# Patient Record
Sex: Female | Born: 1990 | Race: Black or African American | Hispanic: No | Marital: Married | Smoking: Never smoker
Health system: Southern US, Community
[De-identification: ages and names within clinical notes are randomized; demographics above are authoritative.]

## PROBLEM LIST (undated history)

## (undated) ENCOUNTER — Inpatient Hospital Stay (HOSPITAL_COMMUNITY): Payer: Self-pay

## (undated) DIAGNOSIS — A6009 Herpesviral infection of other urogenital tract: Secondary | ICD-10-CM

## (undated) DIAGNOSIS — F909 Attention-deficit hyperactivity disorder, unspecified type: Secondary | ICD-10-CM

## (undated) DIAGNOSIS — J45909 Unspecified asthma, uncomplicated: Secondary | ICD-10-CM

## (undated) DIAGNOSIS — N390 Urinary tract infection, site not specified: Secondary | ICD-10-CM

## (undated) DIAGNOSIS — K529 Noninfective gastroenteritis and colitis, unspecified: Secondary | ICD-10-CM

## (undated) DIAGNOSIS — O009 Unspecified ectopic pregnancy without intrauterine pregnancy: Secondary | ICD-10-CM

## (undated) HISTORY — DX: Unspecified asthma, uncomplicated: J45.909

## (undated) HISTORY — DX: Noninfective gastroenteritis and colitis, unspecified: K52.9

## (undated) HISTORY — PX: NO PAST SURGERIES: SHX2092

---
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
## 2010-03-10 ENCOUNTER — Encounter (INDEPENDENT_AMBULATORY_CARE_PROVIDER_SITE_OTHER): Payer: Self-pay | Admitting: *Deleted

## 2010-03-10 ENCOUNTER — Emergency Department (HOSPITAL_COMMUNITY)
Admission: EM | Admit: 2010-03-10 | Discharge: 2010-03-11 | Payer: Self-pay | Source: Home / Self Care | Admitting: Emergency Medicine

## 2010-03-11 ENCOUNTER — Encounter (INDEPENDENT_AMBULATORY_CARE_PROVIDER_SITE_OTHER): Payer: Self-pay | Admitting: *Deleted

## 2010-03-13 ENCOUNTER — Ambulatory Visit
Admission: RE | Admit: 2010-03-13 | Discharge: 2010-03-13 | Payer: Self-pay | Source: Home / Self Care | Attending: Family | Admitting: Family

## 2010-03-13 ENCOUNTER — Encounter: Payer: Self-pay | Admitting: Family

## 2010-03-13 DIAGNOSIS — K5289 Other specified noninfective gastroenteritis and colitis: Secondary | ICD-10-CM

## 2010-03-13 LAB — CONVERTED CEMR LAB
Beta hcg, urine, semiquantitative: NEGATIVE
Eosinophils Absolute: 0.2 10*3/uL (ref 0.0–0.7)
Eosinophils Relative: 2 % (ref 0–5)
HCT: 37.5 % (ref 36.0–46.0)
Hemoglobin: 12.8 g/dL (ref 12.0–15.0)
MCHC: 34.1 g/dL (ref 30.0–36.0)
MCV: 81.3 fL (ref 78.0–100.0)
Neutro Abs: 5.5 10*3/uL (ref 1.7–7.7)
Platelets: 250 10*3/uL (ref 150–400)

## 2010-03-14 ENCOUNTER — Telehealth: Payer: Self-pay | Admitting: Family

## 2010-04-05 ENCOUNTER — Telehealth: Payer: Self-pay | Admitting: Family

## 2010-04-06 ENCOUNTER — Telehealth: Payer: Self-pay | Admitting: Gastroenterology

## 2010-04-12 ENCOUNTER — Ambulatory Visit: Admit: 2010-04-12 | Payer: Self-pay | Admitting: Gastroenterology

## 2010-04-17 NOTE — Progress Notes (Signed)
Summary: Depo Provera schedule  Phone Note Call from Patient Call back at Home Phone [phone]   Caller: Patient Reason for Call: Talk to Nurse Summary of Call: Pls ck to see if she can have her Depro  shot at next OV 10/23/09 & advise Initial call taken by: Lannette Donath,  October 16, 2009 1:03 PM  Follow-up for Phone Call        Dosing schedule says she is due for next injection  8/17 - 8/31. Will 8/8 be too early?  Pt starts back to school on 10/26/09.  Nicki Guadalajara Fergerson CMA Duncan Dull)  October 16, 2009 3:26 PM   Additional Follow-up for Phone Call Additional follow up Details #1::        I would prefer that we not give it early.  Can she get it at school, or can she come back from school to get it? Alternatively we could try a different birth control method if she wants- such as the patch.  Thanks Additional Follow-up by: Lemont Fillers FNP,  October 17, 2009 9:21 AM    Additional Follow-up for Phone Call Additional follow up Details #2::    Left message on machine to return my call.  Nicki Guadalajara Fergerson CMA Duncan Dull)  October 17, 2009 9:27 AM   Spoke to pt, advised her per [facility] recommendation.  Pt states she wishes to continue with the injection and will be able to get injection at school.  Nicki Guadalajara Fergerson CMA Duncan Dull)  October 18, 2009 8:59 AM

## 2010-04-17 NOTE — Assessment & Plan Note (Signed)
Summary: 3 mth fu/kdc--room 5   Vital Signs:  Patient profile:   20 year old female Menstrual status:  regular Height:      63.75 inches Weight:      120.75 pounds BMI:     20.97 Temp:     98.2 degrees F oral Pulse rate:   60 / minute Pulse rhythm:   regular Resp:     16 per minute BP sitting:   102 / 68  (right arm) Cuff size:   regular  Vitals Entered By: Mervin Kung CMA (August 16, 2009 2:40 PM) CC: room 5  3 month follow up. Pt would like to discuss alternative to Strattera. States she gets hot flashes and feels drowsy while on it. Is Patient Diabetic? No   CC:  room 5  3 month follow up. Pt would like to discuss alternative to Strattera. States she gets hot flashes and feels drowsy while on it.Marland Kitchen  History of Present Illness: Ms Latanya Maudlin is an 20 year old female who presents today for follow up of her ADHD and to receive her Depo injection.  1)ADHD-  Patient reports that she did well in school last semester and earned a 3.4 GPA.  She feels that the strattera helps her to focus.  Last visit Strattera was decreased from 80mg  to 40mg .  She notes less somnolence with the lower dose- but still noted some.  Reports that she stopped Stattera approximately 1 month ago when she completed school.    Allergies: 1)  ! Amoxicillin  Physical Exam  General:  Well-developed,well-nourished,in no acute distress; alert,appropriate and cooperative throughout examination Lungs:  Normal respiratory effort, chest expands symmetrically. Lungs are clear to auscultation, no crackles or wheezes. Heart:  Normal rate and regular rhythm. S1 and S2 normal without gallop, murmur, click, rub or other extra sounds.   Impression & Recommendations:  Problem # 1:  ADHD (ICD-314.01) Assessment Comment Only Weight is stable, patient is currently off of Strattera and plans to stay off for the summer.  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  Plan to resume in the fall.  Problem # 2:  CONTRACEPTIVE  MANAGEMENT (ICD-V25.09) Assessment: Comment Only  Patient reports that she tolerated depo last visit- dose given today in office.  Orders: Depo-Provera 150mg  (Z6109) Admin of Therapeutic Inj  intramuscular or subcutaneous ([address]) Urine Pregnancy Test  ([address])  Patient Instructions: 1)  Please follow up in 3 months. 2)  Have a nice summer.  Current Allergies (reviewed today): ! AMOXICILLIN   Medication Administration  Injection # 1:    Medication: Depo-Provera 150mg     Diagnosis: CONTRACEPTIVE MANAGEMENT (ICD-V25.09)    Route: IM    Site: LUOQ gluteus    Exp Date: 05/16/2010    Lot #: [id number]    Mfr: Teva Parenteral Medicines, In.s    Patient tolerated injection without complications    Given by: Mervin Kung CMA (August 16, 2009 3:28 PM)  Orders Added: 1)  Depo-Provera 150mg  [J1055] 2)  Admin of Therapeutic Inj  intramuscular or subcutaneous [[address]] 3)  Urine Pregnancy Test  [81025] 4)  Est. Patient Level III [62130]   Laboratory Results   Urine Tests      Urine HCG: negative

## 2010-04-17 NOTE — Assessment & Plan Note (Signed)
Summary: REFILL ON MED   Vital Signs:  Patient profile:   20 year old female Menstrual status:  regular Weight:      120.13 pounds BMI:     20.86 Temp:     97.8 degrees F oral Pulse rate:   64 / minute Pulse rhythm:   regular Resp:     16 per minute BP sitting:   120 / 78  (left arm) Cuff size:   regular  Vitals Entered By: Mervin Kung CMA (May 24, 2009 10:52 AM) CC: room 16  follow up for ADHD management Comments Pt would like to get Depo Provera injection today.   CC:  room 16  follow up for ADHD management.  History of Present Illness: Holly Thornton is an 20 year old female who presents for follow up of her ADHD.  Notes tat she has been intolerant of higher dose of strattera (80) due to sedation,  she did well on 40mg  and was able to get through her classes. Notes that she has had some weight loss since last visit.  Patient had pap 2/28- this was done by OB/GYN.   She also came requesting depo injection.     Allergies: 1)  ! Amoxicillin  Physical Exam  General:  Well-developed,well-nourished,in no acute distress; alert,appropriate and cooperative throughout examination Lungs:  Normal respiratory effort, chest expands symmetrically. Lungs are clear to auscultation, no crackles or wheezes. Heart:  Normal rate and regular rhythm. S1 and S2 normal without gallop, murmur, click, rub or other extra sounds. Psych:  Cognition and judgment appear intact. Alert and cooperative with normal attention span and concentration. No apparent delusions, illusions, hallucinations   Impression & Recommendations:  Problem # 1:  ADHD (ICD-314.01) Assessment Improved Will plan to continue Strattera at decreased dose.  I advised pt to monitor weight diet recs- see pt instructions.    Problem # 2:  CONTRACEPTIVE MANAGEMENT (ICD-V25.09) Assessment: Comment Only Patient was given her previous dose of depo by her GYN.  She had an episode of local itching following this injection.   Unfortunately, CMA administered dose prior to my visit with the patient.  Hopefully she will not have a local reaction this time- I advised patient that if she has local reaction again, that she should consider alternate birth control such as the patch or nuvaring.  She had trouble remembering to take the pill.  I advised her to follow up with GYN for further contraceptive discussion moving forward,and to notify us if she develops irritation following this dose of depo.   Orders: Depo-Provera 150mg  (J1055) Admin of Therapeutic Inj  intramuscular or subcutaneous ([address])  Complete Medication List: 1)  Strattera 40 Mg Caps (Atomoxetine hcl) .... One tab by mouth daily  Other Orders: Urine Pregnancy Test  (98119)  Patient Instructions: 1)  Please follow up in 2 months.   2)  Eat regular, healthy, well balanced meals.   3)  Call if you continue to have weight loss- weigh yourself once a week. Prescriptions: STRATTERA 40 MG CAPS (ATOMOXETINE HCL) one tab by mouth daily  #30 x 0   Entered and Authorized by:   Lemont Fillers FNP   Signed by:   Lemont Fillers FNP on 05/24/2009   Method used:   Print then Give to Patient   RxID:   [id number]   Current Allergies (reviewed today): ! AMOXICILLIN  Laboratory Results   Urine Tests      Urine HCG: negative  Medication Administration  Injection # 1:    Medication: Depo-Provera 150mg     Diagnosis: CONTRACEPTIVE MANAGEMENT (ICD-V25.09)    Route: IM    Site: RUOQ gluteus    Exp Date: 05/17/2011    Lot #: [id number]    Mfr: [employer]td  Orders Added: 1)  Urine Pregnancy Test  [81025] 2)  Depo-Provera 150mg  [J1055] 3)  Admin of Therapeutic Inj  intramuscular or subcutaneous [96372] 4)  Est. Patient Level III [[address]]

## 2010-04-17 NOTE — Assessment & Plan Note (Signed)
Summary: 3 month fu/dt resch per pt/dt--Rm 5   Vital Signs:  Patient profile:   20 year old female Menstrual status:  regular Height:      63.75 inches Weight:      123.25 pounds BMI:     21.40 Temp:     98.6 degrees F oral Pulse rate:   78 / minute Pulse rhythm:   regular Resp:     16 per minute BP sitting:   94 / 70  (right arm) Cuff size:   regular  Vitals Entered By: Mervin Kung CMA Duncan Dull) (October 23, 2009 11:04 AM) CC: room 5  3 month follow up. Needs rx for Stratter 40mg  before starting school. Is Patient Diabetic? No   CC:  room 5  3 month follow up. Needs rx for Stratter 40mg  before starting school.Marland Kitchen  History of Present Illness: Ms Holly Thornton is a 20 year old female who presents today for follow up of her ADHD.  She reports that she is returning to school in a few days and wishes to resume Strattera.  Previously she has tolerated the 40mg  dose.  She noted that the higher dose (80 mg) made her too sleepy.   She also wishes to inquire about alternate birth control methods.  She has trouble remembering to take the pill.  She did have one episode of bleeding recently on depo (like a "period").         Allergies: 1)  ! Amoxicillin  Physical Exam  General:  Well-developed,well-nourished,in no acute distress; alert,appropriate and cooperative throughout examination Lungs:  Normal respiratory effort, chest expands symmetrically. Lungs are clear to auscultation, no crackles or wheezes. Heart:  Normal rate and regular rhythm. S1 and S2 normal without gallop, murmur, click, rub or other extra sounds.   Impression & Recommendations:  Problem # 1:  ADHD (ICD-314.01) Assessment Unchanged Will resume Strattera at 40 mg.  Patient was instructed to follow up over the Christmas Holiday when she is back from school.  Sooner if problems or concerns.  Problem # 2:  CONTRACEPTIVE MANAGEMENT (ICD-V25.09) Assessment: Comment Only  Pt wishes to change her birth control method.   Will try Ortho Evra.  Patient was instructed on it's proper use.    Orders: Urine Pregnancy Test  (16109)  Complete Medication List: 1)  Strattera 40 Mg Caps (Atomoxetine hcl) .... One tablet by mouth daily 2)  Ortho Evra 150-20 Mcg/24hr Ptwk (Norelgestromin-eth estradiol) .... Apply one patch weekly x 3 weeks, leave patch off on 4th week.  Patient Instructions: 1)  Please follow up over Christmas break, sooner if symptoms or concerns. 2)  Please start the Ortho Evra patch on 8/21.  Stop the Depo. Prescriptions: ORTHO EVRA 150-20 MCG/24HR PTWK (NORELGESTROMIN-ETH ESTRADIOL) apply one patch weekly x 3 weeks, leave patch off on 4th week.  #3 x 5   Entered and Authorized by:   Lemont Fillers FNP   Signed by:   Lemont Fillers FNP on 10/23/2009   Method used:   [facility]       [address]       [place], [place]  [address]       Ph: [phone]       Fax: [phone]   RxID:   [id number] STRATTERA 40 MG CAPS (ATOMOXETINE HCL) one tablet by mouth daily  #30 x 2   Entered  and Authorized by:   Lemont Fillers FNP   Signed by:   Lemont Fillers FNP on 10/23/2009   Method used:   [facility]       [facility]       [address]       [place], [place]  [address]       Ph: [phone]       Fax: [phone]   RxID:   [id number]   Current Allergies (reviewed today): ! AMOXICILLIN  Laboratory Results   Urine Tests      Urine HCG: negative

## 2010-04-17 NOTE — Progress Notes (Signed)
Summary: stratter refill   Phone Note Refill Request Message from:  Patient on April 14, 2009 8:26 AM  Refills Requested: Medication #1:  STRATTERA 80 MG CAPS one half cap by mouth daily x 3 days then increase to one cap by mouth daily Initial call taken by: Doristine Devoid,  April 14, 2009 8:26 AM  Follow-up for Phone Call        spoke w/ patient aware prescription ready for pick up.........Marland KitchenDoristine Devoid  April 14, 2009 8:30 AM     New/Updated Medications: STRATTERA 80 MG CAPS (ATOMOXETINE HCL) take one tablet daily Prescriptions: STRATTERA 80 MG CAPS (ATOMOXETINE HCL) take one tablet daily  #30 x 0   Entered by:   Doristine Devoid   Authorized by:   Lemont Fillers FNP   Signed by:   Doristine Devoid on 04/14/2009   Method used:   Print then Give to Patient   RxID:   [id number]

## 2010-04-17 NOTE — Progress Notes (Signed)
Summary: questions about rx-- lm  Phone Note Call from Patient Call back at [phone]   Caller: Mom Summary of Call: mom left msg on voicemail had questions about prescription ...Marland KitchenMarland KitchenDoristine Thornton  May 10, 2009 3:28 PM   Follow-up for Phone Call        left message on machine ...Marland KitchenMarland KitchenDoristine Thornton  May 10, 2009 3:28 PM   left message on machine ........Marland KitchenDoristine Thornton  May 17, 2009 3:50 PM   Additional Follow-up for Phone Call Additional follow up Details #1::        pt states that STRATTERA 80 MG is to strong so she would prefer to go back to STRATTERA 40 MG. PLS Advise...............Marland KitchenFelecia Deloach CMA  May 17, 2009 4:31 PM     Additional Follow-up for Phone Call Additional follow up Details #2::    Called patient- tellsme that she stopped strattera 1 week ago due to  hot flashes and drowsiness.  She goes to school in [place], [place]- and will be home on break next week.  I advised patient to make apt to return for follow up next week and we can address then. Follow-up by: Lemont Fillers FNP,  May 17, 2009 5:39 PM  Additional Follow-up for Phone Call Additional follow up Details #3:: Details for Additional Follow-up Action Taken: Pls call pt and arrange follow up next week. thanks  05/18/09 Left message @ 8:55a.m. for pt. to call and schedule appt. for next week. tf,cma  05/19/09 Spoke with pt. and scheduled her for f/u with Melissa for 05/23/09 @ 10:30. tf,cma Additional Follow-up by: Lemont Fillers FNP,  May 17, 2009 5:39 PM

## 2010-04-19 NOTE — Progress Notes (Signed)
Summary: lab result  Phone Note Outgoing Call   Summary of Call: Please call patient to see how she is feeling and let her know that her blood work was normal.  Initial call taken by: Lemont Fillers FNP,  March 14, 2010 8:00 AM  Follow-up for Phone Call        Pt notified. Nicki Guadalajara Fergerson CMA Duncan Dull)  March 14, 2010 8:40 AM

## 2010-04-19 NOTE — Progress Notes (Signed)
Summary: appt sooner tahn first avail -will be new pt  Phone Note From Other Clinic   Caller: [facility] [phone] Call For: Dr Arlyce Dice ( Doc of the Day ) Reason for Call: Schedule Patient Appt Summary of Call: Requesting appt sooner than first avail for Colitis. Requesting withing a couple weeks PA appt ok Initial call taken by: Leanor Kail [facility],  April 06, 2010 9:10 AM  Follow-up for Phone Call        Appointment made with Dr. Arlyce Dice for 04/12/10 @10 :45am. Follow-up by: Selinda Michaels RN,  April 06, 2010 9:43 AM

## 2010-04-19 NOTE — Progress Notes (Signed)
Summary: Pt having sx of Colitis  Phone Note Call from Patient   Caller: Mom Details for Reason: Colitis Summary of Call: Pt's mother came in pt having symptons of her colitis     she is in school in [place], [place]  and needs her meds   please call mother  Holly Thornton   [phone] Initial call taken by: Darral Dash,  April 05, 2010 4:55 PM  Follow-up for Phone Call        Left message on number listed for Holly Thornton for her to return my call. Follow-up by: Lemont Fillers FNP,  April 06, 2010 8:14 AM  Additional Follow-up for Phone Call Additional follow up Details #1::        Notes that her stomach was hurting yesterday.   Symptoms consistent with the pain that she felt when she had colitis back in December.  Took motrin with some relief.  She is at school in [facility].  Recommended that she be evaluated locally either at an urgent care or student health services.  If she is unable to do this she can schedule to be seen with me in [place].  Will also plan referral to GI for formal evaluation due to recurrence of symptoms.   Additional Follow-up by: Lemont Fillers FNP,  April 06, 2010 8:46 AM

## 2010-04-19 NOTE — Assessment & Plan Note (Signed)
Summary: colitis/mhf--Rm 4   Vital Signs:  Patient profile:   20 year old female Menstrual status:  regular Height:      63.75 inches Weight:      115.50 pounds BMI:     20.05 Temp:     97.5 degrees F oral Pulse rate:   90 / minute Pulse rhythm:   regular Resp:     18 per minute BP sitting:   98 / 68  (right arm) Cuff size:   regular  Vitals Entered By: Mervin Kung CMA Duncan Dull) (March 13, 2010 2:30 PM) CC: Pt states she was diagnosed with Colitis by the ER on 03/10/10. Still has abdominal pain, diarrhea, vomiting, intermittent fever and heavy menstrual bleeding x 6 days. Is Patient Diabetic? No Pain Assessment Patient in pain? yes     Location: abdomen Comments Pt states she stopped Strattera because of the way it makes her feel. Has also stopped birth control. Nicki Guadalajara Fergerson CMA Duncan Dull)  March 13, 2010 2:38 PM    Primary Care Christofer Shen:  Lemont Fillers FNP  CC:  Pt states she was diagnosed with Colitis by the ER on 03/10/10. Still has abdominal pain, diarrhea, vomiting, and intermittent fever and heavy menstrual bleeding x 6 days.Marland Kitchen  History of Present Illness: Ms.  Latanya Maudlin is a 20 year old female who presents today for ER f/u.  Started to have abdominal pain on 12/23 and went to ED.  Noted to have "colitis" with a mild leukocytosis.  She was given rx for cipro 500mg  by mouth two times a day x 5 days as well as vicodin for pain.  Since notes that her abdominal pain is located n the top of her stomach.  Pain is improved since 12/25 but not resolved.  Ate for the first time yesterday in 4 days.  + diarrhea- 4x in last 24 hours.  Has period. Started last wedesday. She continues to have heavy bleeding.  She is not on any birth control at present.  Took off in November.  She discontinued due to local skin reaction.    Strattera- makes her feel uneasy, droopy, nauseated.  Has been doing well at school off of strattera.    Allergies: 1)  ! Amoxicillin  Past  History:  Past Medical History: Last updated: 03/01/2009 HIV testing  Family History: Reviewed history from 03/01/2009 and no changes required. Mom- depression, fibroids, low iron Dad- healthy only child  Social History: Reviewed history from 03/01/2009 and no changes required. Single Never Smoked Alcohol use-no Regular exercise-yes Student  Review of Systems       see HPI  Physical Exam  General:  Well-developed,well-nourished,in no acute distress; alert,appropriate and cooperative throughout examination Lungs:  Normal respiratory effort, chest expands symmetrically. Lungs are clear to auscultation, no crackles or wheezes. Heart:  Normal rate and regular rhythm. S1 and S2 normal without gallop, murmur, click, rub or other extra sounds. Abdomen:  Mild tenderness without guarding.  soft and no distention.  + BS   Impression & Recommendations:  Problem # 1:  COLITIS, ACUTE (ICD-558.9) Assessment New Will plan to continue cipro x 10 days total and add metronidazole to her regimen.  Check CBC to evaluate WBC trend.   Orders: TLB-CBC Platelet - w/Differential (85025-CBCD)  Problem # 2:  ADHD (ICD-314.01) Assessment: Improved Stable, despite being off of strattera.  Continue to monitor.  Problem # 3:  CONTRACEPTIVE MANAGEMENT (ICD-V25.09) Long discussion with patient and mother regarding importance of birth control.  She will consider  OCP initiation this summer when she is home. For now she wishes to resume depo.  We discussed that this should not be a long term birth control method.  Orders: Depo-Provera 150mg  (J1055) Admin of Therapeutic Inj  intramuscular or subcutaneous ([address]) Urine Pregnancy Test  ([address])  Complete Medication List: 1)  Cipro 500 Mg Tabs (Ciprofloxacin hcl) .... One tablet by mouth two times a day until finished 2)  Metronidazole 500 Mg Tabs (Metronidazole) .... One tablet by mouth three times a day x 10 days 3)  Depo-provera 150 Mg/ml Susp  (Medroxyprogesterone acetate) .... 150mg  im every 3 months.  Patient Instructions: 1)  Please complete your lab work downstairs today.   2)  Go to the ER if you develop worsening abdominal pain, or fever over 101.   3)  Follow up in 1 week. Prescriptions: METRONIDAZOLE 500 MG TABS (METRONIDAZOLE) one tablet by mouth three times a day x 10 days  #30 x 0   Entered and Authorized by:   Lemont Fillers FNP   Signed by:   Lemont Fillers FNP on 03/13/2010   Method used:   [facility]       [facility]       [address]       [place], [place]  [address]       Ph: [phone]       Fax: [phone]   RxID:   [id number] CIPRO 500 MG TABS (CIPROFLOXACIN HCL) one tablet by mouth two times a day until finished  #10 x 0   Entered and Authorized by:   Lemont Fillers FNP   Signed by:   Lemont Fillers FNP on 03/13/2010   Method used:   [facility]       [facility]       [address]       [place], [place]  [address]       Ph: [phone]       Fax: [phone]   RxID:   [id number]    Medication Administration  Injection # 3:    Medication: Depo-Provera 150mg     Diagnosis: CONTRACEPTIVE MANAGEMENT (ICD-V25.09)    Route: IM    Site: L deltoid    Exp Date: 06/15/2012    Lot #: [id number]    Mfr: Francisca December    Patient tolerated injection without complications    Given by: Glendell Docker CMA (March 13, 2010 3:18 PM)  Orders Added: 1)  TLB-CBC Platelet - w/Differential [85025-CBCD] 2)  Depo-Provera 150mg  [J1055] 3)  Admin of Therapeutic Inj  intramuscular or subcutaneous [96372] 4)  Urine Pregnancy Test  [81025] 5)  Est. Patient Level III [[address]]    Current Allergies (reviewed today): ! AMOXICILLIN   Medication Administration  Injection # 3:    Medication: Depo-Provera 150mg     Diagnosis: CONTRACEPTIVE MANAGEMENT  (ICD-V25.09)    Route: IM    Site: L deltoid    Exp Date: 06/15/2012    Lot #: [id number]    Mfr: Francisca December    Patient tolerated injection without complications    Given by: Glendell Docker CMA (March 13, 2010 3:18 PM)  Orders Added: 1)  TLB-CBC Platelet - w/Differential [85025-CBCD] 2)  Depo-Provera 150mg  [J1055] 3)  Admin of Therapeutic Inj  intramuscular or subcutaneous [96372] 4)  Urine Pregnancy Test  [81025] 5)  Est. Patient Level III [99213]   Laboratory Results   Urine Tests      Urine HCG: negative

## 2010-05-22 ENCOUNTER — Emergency Department (HOSPITAL_BASED_OUTPATIENT_CLINIC_OR_DEPARTMENT_OTHER)
Admission: EM | Admit: 2010-05-22 | Discharge: 2010-05-22 | Disposition: A | Discharge status: Home / Self Care | Payer: Self-pay | Attending: Emergency Medicine | Admitting: Emergency Medicine

## 2010-05-22 DIAGNOSIS — B353 Tinea pedis: Secondary | ICD-10-CM | POA: Insufficient documentation

## 2010-05-22 DIAGNOSIS — R21 Rash and other nonspecific skin eruption: Secondary | ICD-10-CM | POA: Insufficient documentation

## 2010-05-22 DIAGNOSIS — J45909 Unspecified asthma, uncomplicated: Secondary | ICD-10-CM | POA: Insufficient documentation

## 2010-05-28 LAB — URINALYSIS, ROUTINE W REFLEX MICROSCOPIC
Ketones, ur: 15 mg/dL — AB
Protein, ur: NEGATIVE mg/dL
Specific Gravity, Urine: 1.038 — ABNORMAL HIGH (ref 1.005–1.030)
Urobilinogen, UA: 1 mg/dL (ref 0.0–1.0)
pH: 6 (ref 5.0–8.0)

## 2010-05-28 LAB — COMPREHENSIVE METABOLIC PANEL
AST: 21 U/L (ref 0–37)
Alkaline Phosphatase: 41 U/L (ref 39–117)
CO2: 24 mEq/L (ref 19–32)
Glucose, Bld: 81 mg/dL (ref 70–99)
Potassium: 3.9 mEq/L (ref 3.5–5.1)
Sodium: 139 mEq/L (ref 135–145)
Total Bilirubin: 0.5 mg/dL (ref 0.3–1.2)

## 2010-05-28 LAB — CBC
Hemoglobin: 13.1 g/dL (ref 12.0–15.0)
MCH: 28.4 pg (ref 26.0–34.0)
MCHC: 33.8 g/dL (ref 30.0–36.0)
MCV: 84.2 fL (ref 78.0–100.0)
Platelets: 229 10*3/uL (ref 150–400)
RBC: 4.61 MIL/uL (ref 3.87–5.11)

## 2010-05-28 LAB — DIFFERENTIAL
Basophils Relative: 0 % (ref 0–1)
Lymphocytes Relative: 8 % — ABNORMAL LOW (ref 12–46)
Lymphs Abs: 1.2 10*3/uL (ref 0.7–4.0)
Monocytes Absolute: 0.4 10*3/uL (ref 0.1–1.0)
Neutro Abs: 13.1 10*3/uL — ABNORMAL HIGH (ref 1.7–7.7)
Neutrophils Relative %: 89 % — ABNORMAL HIGH (ref 43–77)

## 2010-10-19 ENCOUNTER — Encounter: Payer: Self-pay | Admitting: Family

## 2010-10-22 ENCOUNTER — Ambulatory Visit (INDEPENDENT_AMBULATORY_CARE_PROVIDER_SITE_OTHER): Payer: Managed Care, Other (non HMO) | Admitting: Family

## 2010-10-22 ENCOUNTER — Encounter: Payer: Self-pay | Admitting: Family

## 2010-10-22 VITALS — BP 90/60 | HR 66 | Temp 98.1°F | Resp 16 | Ht 63.74 in

## 2010-10-22 DIAGNOSIS — N926 Irregular menstruation, unspecified: Secondary | ICD-10-CM

## 2010-10-22 DIAGNOSIS — Z309 Encounter for contraceptive management, unspecified: Secondary | ICD-10-CM

## 2010-10-22 MED ORDER — LEVONORGEST-ETH ESTRAD 91-DAY 0.15-0.03 &0.01 MG PO TABS: 1.0000 | ORAL_TABLET | Freq: Every day | ORAL | Status: DC

## 2010-10-22 NOTE — Progress Notes (Signed)
  Subjective:    Patient ID: Holly Thornton, female    DOB: 1990/04/01, 20 y.o.   MRN: [id number]  HPI  Holly Thornton is a 20 yr old female who presents today with chief complaint of abnormal menses.  She received her last shot of depo on 3/23 while at school. She was due to return for Depo shot on 6/26.  She had a normal period the last week of June and then started to bleed again the second week of July.  Noted associated clotting and cramping.  She reports that she is not  not sexually active.  She as been taking depo for the last 2 years.      Review of Systems See HPI  No past medical history on file.  History   Social History  . Marital Status: Single    Spouse Name: N/A    Number of Children: N/A  . Years of Education: N/A   Occupational History  . Not on file.   Social History Main Topics  . Smoking status: Never Smoker   . Smokeless tobacco: Never Used  . Alcohol Use: No  . Drug Use: Not on file  . Sexually Active: Not on file   Other Topics Concern  . Not on file   Social History Narrative   Regular exercise:  Yes    No past surgical history on file.  Family History  Problem Relation Age of Onset  . Depression Mother   . Fibroids Mother   . Anemia Mother     Allergies  Allergen Reactions  . Amoxicillin     No current outpatient prescriptions on file prior to visit.    BP 90/60  Pulse 66  Temp(Src) 98.1 F (36.7 C) (Oral)  Resp 16  Ht 5' 3.74" (1.619 m)  LMP 10/22/2010       Objective:   Physical Exam  Constitutional: She appears well-developed and well-nourished.  Cardiovascular: Normal rate and regular rhythm.   Pulmonary/Chest: Effort normal and breath sounds normal.  Musculoskeletal: She exhibits no edema.  Psychiatric: She has a normal mood and affect. Her behavior is normal. Judgment and thought content normal.          Assessment & Plan:

## 2010-10-22 NOTE — Patient Instructions (Signed)
Please follow up in 6 months, sooner if problems or concerns.  

## 2010-10-22 NOTE — Assessment & Plan Note (Signed)
Likely due to coming off of Depo.  We discussed that Depo should not be used long term. She wishes to try a 3 month OCP.  This should help with her dysmenorrhea and irregular menses as well as provide her with an alternate form of birth control.  Urine HCG is negative today.  Will initiate OCP- pt to f/u in 6 mos.

## 2011-03-04 ENCOUNTER — Encounter: Payer: Self-pay | Admitting: Family

## 2011-03-04 ENCOUNTER — Ambulatory Visit (INDEPENDENT_AMBULATORY_CARE_PROVIDER_SITE_OTHER): Payer: Managed Care, Other (non HMO) | Admitting: Family

## 2011-03-04 DIAGNOSIS — N926 Irregular menstruation, unspecified: Secondary | ICD-10-CM

## 2011-03-04 DIAGNOSIS — F909 Attention-deficit hyperactivity disorder, unspecified type: Secondary | ICD-10-CM

## 2011-03-04 DIAGNOSIS — Z23 Encounter for immunization: Secondary | ICD-10-CM

## 2011-03-04 LAB — POCT URINE PREGNANCY: Preg Test, Ur: NEGATIVE

## 2011-03-04 NOTE — Assessment & Plan Note (Signed)
Will giver her trial of a 3 month continuous OCP.  If her bleeding does not improve, will plan to refer her to GYN for further evaluation.

## 2011-03-04 NOTE — Assessment & Plan Note (Signed)
Pt reports that this is currently well controlled off of medication. Will monitor.

## 2011-03-04 NOTE — Patient Instructions (Signed)
Please follow up in 4 months

## 2011-03-04 NOTE — Progress Notes (Signed)
  Subjective:    Patient ID: Holly Thornton, female    DOB: [birth year], 20 y.o.   MRN: [id number]  HPI  Holly Thornton is a 20 yr old female who presents today for follow up.  1) Contraception- reports that she has been bleeding for almost 2 weeks- notes that she has been using double pads 4 times a day.  Not sexually active, never started the OCP that was prescribed last visit.  Since going off of the Depo she has had her period monthly, but reports that it has been lasting up to 2 weeks.    2) ADHD- she has not been in school for the last semester. She feels that things are doing a lot better.   3) Wants a flu shot.     Review of Systems See HPI  No past medical history on file.  History   Social History  . Marital Status: Single    Spouse Name: N/A    Number of Children: N/A  . Years of Education: N/A   Occupational History  . Not on file.   Social History Main Topics  . Smoking status: Never Smoker   . Smokeless tobacco: Never Used  . Alcohol Use: No  . Drug Use: Not on file  . Sexually Active: Not on file   Other Topics Concern  . Not on file   Social History Narrative   Regular exercise:  Yes    No past surgical history on file.  Family History  Problem Relation Age of Onset  . Depression Mother   . Fibroids Mother   . Anemia Mother     Allergies  Allergen Reactions  . Amoxicillin     No current outpatient prescriptions on file prior to visit.    BP 90/66  Pulse 78  Temp(Src) 97.8 F (36.6 C) (Oral)  Resp 16  Wt 120 lb 1.3 oz (54.468 kg)  LMP 03/04/2011       Objective:   Physical Exam  Constitutional: She appears well-developed and well-nourished. No distress.  Cardiovascular: Normal rate and regular rhythm.   No murmur heard. Pulmonary/Chest: Effort normal and breath sounds normal. No respiratory distress. She has no wheezes. She has no rales. She exhibits no tenderness.  Skin: Skin is warm and dry.  Psychiatric: She has a normal  mood and affect. Her behavior is normal. Judgment and thought content normal.          Assessment & Plan:

## 2011-03-04 NOTE — Progress Notes (Signed)
Addended by: Mervin Kung A on: 03/04/2011 12:07 PM   Modules accepted: Orders

## 2011-04-24 ENCOUNTER — Ambulatory Visit: Payer: Managed Care, Other (non HMO) | Admitting: Family

## 2011-06-28 ENCOUNTER — Ambulatory Visit: Payer: Managed Care, Other (non HMO) | Admitting: Family

## 2011-10-15 ENCOUNTER — Encounter: Payer: Self-pay | Admitting: Family

## 2011-10-15 ENCOUNTER — Ambulatory Visit (INDEPENDENT_AMBULATORY_CARE_PROVIDER_SITE_OTHER): Payer: Managed Care, Other (non HMO) | Admitting: Family

## 2011-10-15 VITALS — BP 90/68 | HR 67 | Temp 98.0°F | Resp 14 | Ht 64.5 in | Wt 111.1 lb

## 2011-10-15 DIAGNOSIS — B360 Pityriasis versicolor: Secondary | ICD-10-CM

## 2011-10-15 DIAGNOSIS — N926 Irregular menstruation, unspecified: Secondary | ICD-10-CM

## 2011-10-15 DIAGNOSIS — Z0000 Encounter for general adult medical examination without abnormal findings: Secondary | ICD-10-CM | POA: Insufficient documentation

## 2011-10-15 LAB — LIPID PANEL
Cholesterol: 175 mg/dL (ref 0–200)
HDL: 52 mg/dL (ref >39)
LDL Cholesterol: 112 mg/dL — ABNORMAL HIGH (ref 0–99)
Triglycerides: 53 mg/dL (ref <150)

## 2011-10-15 LAB — CBC WITH DIFFERENTIAL/PLATELET
Basophils Relative: 1 % (ref 0–1)
Eosinophils Relative: 3 % (ref 0–5)
HCT: 39.4 % (ref 36.0–46.0)
Hemoglobin: 13.4 g/dL (ref 12.0–15.0)
Lymphocytes Relative: 45 % (ref 12–46)
MCH: 28.2 pg (ref 26.0–34.0)
MCHC: 34 g/dL (ref 30.0–36.0)
MCV: 82.8 fL (ref 78.0–100.0)
Neutro Abs: 2 10*3/uL (ref 1.7–7.7)
Neutrophils Relative %: 42 % — ABNORMAL LOW (ref 43–77)
RDW: 13.8 % (ref 11.5–15.5)

## 2011-10-15 LAB — BASIC METABOLIC PANEL WITH GFR
BUN: 15 mg/dL (ref 6–23)
Chloride: 103 mEq/L (ref 96–112)
GFR, Est Non African American: 83 mL/min
Glucose, Bld: 81 mg/dL (ref 70–99)

## 2011-10-15 LAB — TSH: TSH: 1.867 u[IU]/mL (ref 0.350–4.500)

## 2011-10-15 LAB — HEPATIC FUNCTION PANEL
AST: 21 U/L (ref 0–37)
Albumin: 4.9 g/dL (ref 3.5–5.2)
Alkaline Phosphatase: 36 U/L — ABNORMAL LOW (ref 39–117)
Bilirubin, Direct: 0.2 mg/dL (ref 0.0–0.3)
Indirect Bilirubin: 0.5 mg/dL (ref 0.0–0.9)
Total Bilirubin: 0.7 mg/dL (ref 0.3–1.2)
Total Protein: 7.5 g/dL (ref 6.0–8.3)

## 2011-10-15 MED ORDER — SELENIUM SULFIDE 2.5 % EX LOTN: TOPICAL_LOTION | CUTANEOUS | Status: DC

## 2011-10-15 NOTE — Patient Instructions (Addendum)
Please complete your blood work prior to leaving. Schedule pap smear when period is over.

## 2011-10-15 NOTE — Assessment & Plan Note (Signed)
Some breakthrough bleeding this month.  Encouraged pt to continue OCP. Call if bleeding does not stop in next few days or if she has persistent breakthrough bleeding on the next pack.

## 2011-10-15 NOTE — Assessment & Plan Note (Signed)
Trial of selsun lotion.  

## 2011-10-15 NOTE — Assessment & Plan Note (Addendum)
Immunizations reviewed and up to date. She is counseled on importance of eating 3 regular meals a day. Fasting labs today.

## 2011-10-15 NOTE — Progress Notes (Signed)
Subjective:    Patient ID: Holly Thornton, female    DOB: 1990/11/27, 21 y.o.   MRN: [id number]  HPI  Preventative- She has been walking a lot on campus.  Also was working out at [employer]  Has lost some weight while at school- due to increased activity, not eating reg meals. Tetanus is up to date.  Rash- reports that she developed itchy rash while at school and was told it was eczema.  She was treated with steroids. Itching has resolved, but discoloration persists.   Irregular menses- now on Camrese.  First 3 months- no spotting, regular period at the end of the pack.  Started 2nd pack 3 weeks ago.  Now bleeding x 3 days.  Reports no chance of pregnancy.  Not sexually active x 6 months.   Review of Systems  Constitutional: Positive for unexpected weight change.  HENT: Negative for hearing loss.   Eyes: Negative for visual disturbance.  Respiratory: Negative for cough.   Cardiovascular: Negative for chest pain.  Gastrointestinal: Negative for nausea, vomiting and diarrhea.  Genitourinary:       Reports that she is bleeding- in 3rd week of second pack. She has been bleeding since Saturday.    Musculoskeletal: Negative for myalgias and arthralgias.  Skin: Positive for rash.  Neurological: Negative for headaches.  Hematological: Negative for adenopathy.   No past medical history on file.  History   Social History  . Marital Status: Single    Spouse Name: N/A    Number of Children: N/A  . Years of Education: N/A   Occupational History  . Not on file.   Social History Main Topics  . Smoking status: Never Smoker   . Smokeless tobacco: Never Used  . Alcohol Use: 1.0 oz/week    2 drink(s) per week  . Drug Use: Not on file  . Sexually Active: Not on file   Other Topics Concern  . Not on file   Social History Narrative   Regular exercise:  Yes, walksCaffeine use:  2 glasses soda daily    No past surgical history on file.  Family History  Problem Relation Age of  Onset  . Depression Mother   . Fibroids Mother   . Anemia Mother     Allergies  Allergen Reactions  . Amoxicillin     Current Outpatient Prescriptions on File Prior to Visit  Medication Sig Dispense Refill  . Levonorgestrel-Ethinyl Estradiol (AMETHIA,CAMRESE) 0.15-0.03 &0.01 MG tablet Take 1 tablet by mouth daily.        . Multiple Vitamins-Minerals (MULTIVITAMIN WITH MINERALS) tablet Take 1 tablet by mouth daily.          BP 90/68  Pulse 67  Temp 98 F (36.7 C) (Oral)  Resp 14  Ht 5' 4.5" (1.638 m)  Wt 111 lb 1.3 oz (50.386 kg)  BMI 18.77 kg/m2  SpO2 98%  LMP 10/15/2011       Objective:   Physical Exam  Physical Exam  Constitutional: She is oriented to person, place, and time. She appears well-developed and well-nourished. No distress.  HENT:  Head: Normocephalic and atraumatic.  Right Ear: Tympanic membrane and ear canal normal.  Left Ear: Tympanic membrane and ear canal normal.  Mouth/Throat: Oropharynx is clear and moist.  Eyes: Pupils are equal, round, and reactive to light. No scleral icterus.  Neck: Normal range of motion. No thyromegaly present.  Cardiovascular: Normal rate and regular rhythm.   No murmur heard. Pulmonary/Chest: Effort normal and breath sounds  normal. No respiratory distress. He has no wheezes. She has no rales. She exhibits no tenderness.  Abdominal: Soft. Bowel sounds are normal. He exhibits no distension and no mass. There is no tenderness. There is no rebound and no guarding.  Musculoskeletal: She exhibits no edema.  Lymphadenopathy:    She has no cervical adenopathy.  Neurological: She is alert and oriented to person, place, and time. She has normal reflexes. She exhibits normal muscle tone. Coordination normal.  Skin: Skin is warm and dry. Hyperpigmented rash is noted on arms/thighs. Psychiatric: She has a normal mood and affect. Her behavior is normal. Judgment and thought content normal.  Breasts: Examined lying Right: Without  masses, retractions, discharge or axillary adenopathy.  Left: Without masses, retractions, discharge or axillary adenopathy.  Inguinal/mons: Normal without inguinal adenopathy  External genitalia: Normal  BUS/Urethra/Skene's glands: Normal  Bladder: Normal  Vagina: Normal- fair amount of menstrual blood is noted.  Pap deferred.           Assessment & Plan:         Assessment & Plan:

## 2011-10-16 ENCOUNTER — Encounter: Payer: Self-pay | Admitting: Family

## 2011-10-16 LAB — URINALYSIS, ROUTINE W REFLEX MICROSCOPIC
Glucose, UA: NEGATIVE mg/dL
Ketones, ur: 15 mg/dL — AB
Leukocytes, UA: NEGATIVE
Nitrite: NEGATIVE
Specific Gravity, Urine: 1.027 (ref 1.005–1.030)

## 2011-10-16 LAB — URINALYSIS, MICROSCOPIC ONLY: Crystals: NONE SEEN

## 2012-03-10 ENCOUNTER — Encounter: Payer: Self-pay | Admitting: Family

## 2012-03-10 ENCOUNTER — Other Ambulatory Visit (HOSPITAL_COMMUNITY)
Admission: RE | Admit: 2012-03-10 | Discharge: 2012-03-10 | Disposition: A | Discharge status: Home / Self Care | Payer: Managed Care, Other (non HMO) | Source: Ambulatory Visit | Attending: Family | Admitting: Family

## 2012-03-10 ENCOUNTER — Ambulatory Visit (INDEPENDENT_AMBULATORY_CARE_PROVIDER_SITE_OTHER): Payer: Managed Care, Other (non HMO) | Admitting: Family

## 2012-03-10 VITALS — BP 90/60 | HR 69 | Temp 97.9°F | Resp 16 | Wt 111.0 lb

## 2012-03-10 DIAGNOSIS — N898 Other specified noninflammatory disorders of vagina: Secondary | ICD-10-CM

## 2012-03-10 DIAGNOSIS — Z01419 Encounter for gynecological examination (general) (routine) without abnormal findings: Secondary | ICD-10-CM

## 2012-03-10 DIAGNOSIS — Z113 Encounter for screening for infections with a predominantly sexual mode of transmission: Secondary | ICD-10-CM | POA: Insufficient documentation

## 2012-03-10 DIAGNOSIS — N760 Acute vaginitis: Secondary | ICD-10-CM | POA: Insufficient documentation

## 2012-03-10 MED ORDER — LEVONORGEST-ETH ESTRAD 91-DAY 0.15-0.03 &0.01 MG PO TABS: 1.0000 | ORAL_TABLET | Freq: Every day | ORAL | Status: DC

## 2012-03-10 NOTE — Assessment & Plan Note (Signed)
Will have pt continue monistat.  Wet prep obtained, pap performed.  Will also add on GC/Chlamydia screen to pap.  Refill OCP.

## 2012-03-10 NOTE — Patient Instructions (Addendum)
We will contact you on Friday with your results.  Continue Monistat. Merry Christmas!

## 2012-03-10 NOTE — Progress Notes (Signed)
  Subjective:    Patient ID: Holly Thornton, female    DOB: 04-Jul-1990, 21 y.o.   MRN: [id number]  HPI  Pt reports that she was diagnosed with UTI at school and took abx.  She then developed itching/vaginal discharge.  Took OTC monistat.  As of this AM she was still irritated.  She has not had a pap smear in the last year.     Review of Systems See HPI  No past medical history on file.  History   Social History  . Marital Status: Single    Spouse Name: N/A    Number of Children: N/A  . Years of Education: N/A   Occupational History  . Not on file.   Social History Main Topics  . Smoking status: Never Smoker   . Smokeless tobacco: Never Used  . Alcohol Use: 1.0 oz/week    2 drink(s) per week  . Drug Use: Not on file  . Sexually Active: Not on file   Other Topics Concern  . Not on file   Social History Narrative   Regular exercise:  Yes, walksCaffeine use:  2 glasses soda daily    No past surgical history on file.  Family History  Problem Relation Age of Onset  . Depression Mother   . Fibroids Mother   . Anemia Mother     Allergies  Allergen Reactions  . Amoxicillin     Current Outpatient Prescriptions on File Prior to Visit  Medication Sig Dispense Refill  . Levonorgestrel-Ethinyl Estradiol (AMETHIA,CAMRESE) 0.15-0.03 &0.01 MG tablet Take 1 tablet by mouth daily.  1 Package  3  . Multiple Vitamins-Minerals (MULTIVITAMIN WITH MINERALS) tablet Take 1 tablet by mouth daily.        Marland Kitchen selenium sulfide (SELSUN) 2.5 % shampoo Lather on wet skin and leave on for 10 minutes once daily x 7 days.  118 mL  3    BP 90/60  Pulse 69  Temp 97.9 F (36.6 C) (Oral)  Resp 16  Wt 111 lb (50.349 kg)  SpO2 99%  LMP 02/11/2012       Objective:   Physical Exam  Constitutional: She appears well-developed and well-nourished. No distress.  Genitourinary:        External genitalia: Normal  BUS/Urethra/Skene's glands: Normal  Bladder: Normal  Vagina: Normal with  some white discharge Cervix: Normal (pap performed with presence of chaperone) Uterus: normal in size, shape and contour. Midline and mobile  Adnexa/parametria:  Rt: Without masses or tenderness.  Lt: Without masses or tenderness.  Anus and perineum: Normal    Psychiatric: She has a normal mood and affect. Her behavior is normal. Judgment and thought content normal.          Assessment & Plan:

## 2012-03-11 LAB — WET PREP BY MOLECULAR PROBE: Trichomonas vaginosis: NEGATIVE

## 2012-03-13 ENCOUNTER — Telehealth: Payer: Self-pay | Admitting: Family

## 2012-03-13 MED ORDER — METRONIDAZOLE 500 MG PO TABS: 500.0000 mg | ORAL_TABLET | Freq: Two times a day (BID) | ORAL | Status: DC

## 2012-03-13 MED ORDER — AZITHROMYCIN 500 MG PO TABS: 1000.0000 mg | ORAL_TABLET | Freq: Once | ORAL | Status: DC

## 2012-03-13 NOTE — Telephone Encounter (Addendum)
Pls call pt and let her know that her wet prep shows bacterial vaginosis.  I would like her to start metronidazole tabs. Rx sent. She should avoid alcohol while taking this medication.   Also, her pap smear is normal but she tested positive for chlamydia.  I would like for her to take azithromycin 1000mg  by mouth once.  Her partner needs to be treated as well.  It is possible that the antibiotics can interfere with the efficacy of her OCP-Recommend protected sex at all times.

## 2012-03-13 NOTE — Telephone Encounter (Signed)
LMOM with contact name & number for return call RE: results & further provider instructions/SLS 

## 2012-03-13 NOTE — Telephone Encounter (Signed)
Patient returned phone call. Best # [phone]

## 2012-03-13 NOTE — Telephone Encounter (Signed)
Patient called read her Melissa's note and she voiced understanding and will take both meds and be careful to have protected sex

## 2012-04-02 ENCOUNTER — Telehealth: Payer: Self-pay | Admitting: Family

## 2012-04-02 NOTE — Telephone Encounter (Signed)
Patient states the [facility] is requesting that we fax them a letter stating that patient has colitis and that she needs to be near facilities in case she has a flare up.  Fax: [phone] Attn: Arturo Morton Sweezer

## 2012-04-06 NOTE — Telephone Encounter (Signed)
Left message requesting that pt return my call. 

## 2012-04-08 ENCOUNTER — Encounter: Payer: Self-pay | Admitting: Family

## 2012-04-08 NOTE — Telephone Encounter (Signed)
Pt returned your call and can be reached at [phone].

## 2012-04-08 NOTE — Telephone Encounter (Signed)
Spoke to patient.  She reports no GI complaints at this time, but reports that she had a colitis flare up last April near school.  She tells me that she was evaluated in the ED for this.  She is requesting letter for school be mailed to her. We did discuss referral to GI but she declines at this time as she is feeling well.

## 2012-04-16 NOTE — Telephone Encounter (Signed)
Received call from pt requesting that letter be faxed to her attn: @ [phone]. Letter printed and faxed.

## 2012-05-13 ENCOUNTER — Ambulatory Visit (INDEPENDENT_AMBULATORY_CARE_PROVIDER_SITE_OTHER): Payer: Managed Care, Other (non HMO) | Admitting: Family

## 2012-05-13 VITALS — BP 92/70 | HR 71 | Temp 98.2°F | Resp 16 | Wt 110.0 lb

## 2012-05-13 DIAGNOSIS — R61 Generalized hyperhidrosis: Secondary | ICD-10-CM

## 2012-05-13 DIAGNOSIS — K523 Indeterminate colitis: Secondary | ICD-10-CM | POA: Insufficient documentation

## 2012-05-13 DIAGNOSIS — A09 Infectious gastroenteritis and colitis, unspecified: Secondary | ICD-10-CM

## 2012-05-13 DIAGNOSIS — Z8619 Personal history of other infectious and parasitic diseases: Secondary | ICD-10-CM

## 2012-05-13 DIAGNOSIS — A749 Chlamydial infection, unspecified: Secondary | ICD-10-CM

## 2012-05-13 LAB — TSH: TSH: 0.768 u[IU]/mL (ref 0.350–4.500)

## 2012-05-13 LAB — HIV ANTIBODY (ROUTINE TESTING W REFLEX): HIV: NONREACTIVE

## 2012-05-13 NOTE — Assessment & Plan Note (Signed)
Check TSH and HIV.

## 2012-05-13 NOTE — Assessment & Plan Note (Signed)
GC/Chlamydia screen was obtained today.

## 2012-05-13 NOTE — Assessment & Plan Note (Signed)
Will refer to GI for further evaluation.  

## 2012-05-13 NOTE — Progress Notes (Signed)
  Subjective:    Patient ID: Holly Thornton, female    DOB: 1990-07-11, 22 y.o.   MRN: [id number]  HPI  Pt with interittent abdominal pain, has seen blood in stool on occasion- last episode several months ago.  She is requesting referral to GI.    She reports + night sweats- almost every night.  Wakes up with sheets soaked.    She was treated for chlamydia in December.  Partner was also treated. Would like to be retested. Denies vaginal discharge. She reports that her last HIV test was in June.    Review of Systems    see HPI  No past medical history on file.  History   Social History  . Marital Status: Single    Spouse Name: N/A    Number of Children: N/A  . Years of Education: N/A   Occupational History  . Not on file.   Social History Main Topics  . Smoking status: Never Smoker   . Smokeless tobacco: Never Used  . Alcohol Use: 1.0 oz/week    2 drink(s) per week  . Drug Use: Not on file  . Sexually Active: Not on file   Other Topics Concern  . Not on file   Social History Narrative   Regular exercise:  Yes, walks   Caffeine use:  2 glasses soda daily                No past surgical history on file.  Family History  Problem Relation Age of Onset  . Depression Mother   . Fibroids Mother   . Anemia Mother     Allergies  Allergen Reactions  . Amoxicillin     Current Outpatient Prescriptions on File Prior to Visit  Medication Sig Dispense Refill  . Levonorgestrel-Ethinyl Estradiol (AMETHIA,CAMRESE) 0.15-0.03 &0.01 MG tablet Take 1 tablet by mouth daily.  1 Package  3  . Multiple Vitamins-Minerals (MULTIVITAMIN WITH MINERALS) tablet Take 1 tablet by mouth daily.        Marland Kitchen selenium sulfide (SELSUN) 2.5 % shampoo Lather on wet skin and leave on for 10 minutes once daily x 7 days.  118 mL  3  . azithromycin (ZITHROMAX) 500 MG tablet Take 2 tablets (1,000 mg total) by mouth once.  2 tablet  0  . metroNIDAZOLE (FLAGYL) 500 MG tablet Take 1 tablet (500 mg  total) by mouth 2 (two) times daily.  14 tablet  0   No current facility-administered medications on file prior to visit.    BP 92/70  Pulse 71  Temp(Src) 98.2 F (36.8 C) (Oral)  Resp 16  Wt 110 lb 0.6 oz (49.914 kg)  BMI 18.6 kg/m2  SpO2 99%     Objective:   Physical Exam  Constitutional: She is oriented to person, place, and time. She appears well-developed and well-nourished. No distress.  Cardiovascular: Normal rate and regular rhythm.   No murmur heard. Pulmonary/Chest: Effort normal and breath sounds normal. No respiratory distress. She has no wheezes. She has no rales. She exhibits no tenderness.  Musculoskeletal: She exhibits no edema.  Lymphadenopathy:    She has no cervical adenopathy.  Neurological: She is alert and oriented to person, place, and time.  Psychiatric: She has a normal mood and affect. Her behavior is normal. Thought content normal.          Assessment & Plan:

## 2012-05-13 NOTE — Patient Instructions (Addendum)
Please complete your lab work prior to leaving. We will contact you with your results.  

## 2012-05-14 LAB — GC/CHLAMYDIA PROBE AMP: GC Probe RNA: NEGATIVE

## 2012-05-15 ENCOUNTER — Encounter: Payer: Self-pay | Admitting: Gastroenterology

## 2012-06-02 ENCOUNTER — Other Ambulatory Visit: Payer: Self-pay

## 2012-06-02 ENCOUNTER — Encounter: Payer: Self-pay | Admitting: Gastroenterology

## 2012-06-02 ENCOUNTER — Other Ambulatory Visit: Payer: Managed Care, Other (non HMO)

## 2012-06-02 ENCOUNTER — Ambulatory Visit (INDEPENDENT_AMBULATORY_CARE_PROVIDER_SITE_OTHER): Payer: Managed Care, Other (non HMO) | Admitting: Gastroenterology

## 2012-06-02 VITALS — BP 86/50 | HR 65 | Ht 63.5 in | Wt 114.0 lb

## 2012-06-02 DIAGNOSIS — R197 Diarrhea, unspecified: Secondary | ICD-10-CM

## 2012-06-02 DIAGNOSIS — K529 Noninfective gastroenteritis and colitis, unspecified: Secondary | ICD-10-CM

## 2012-06-02 MED ORDER — MOVIPREP 100 G PO SOLR: 1.0000 | Freq: Once | ORAL | Status: DC

## 2012-06-02 NOTE — Patient Instructions (Addendum)
You will be set up for a colonoscopy (MAC sedation, LEC) for diarrhea. You will have labs checked today in the basement lab.  Please head down after you check out with the front desk  (stool testing for fecal leuks, c. Diff, routine culture). Take one imodium twice daily for your diarrhea for now.                                               We are excited to introduce MyChart, a new best-in-class service that provides you online access to important information in your electronic medical record. We want to make it easier for you to view your health information - all in one secure location - when and where you need it. We expect MyChart will enhance the quality of care and service we provide.  When you register for MyChart, you can:    View your test results.    Request appointments and receive appointment reminders via email.    Request medication renewals.    View your medical history, allergies, medications and immunizations.    Communicate with your physician's office through a password-protected site.    Conveniently print information such as your medication lists.  To find out if MyChart is right for you, please talk to a member of our clinical staff today. We will gladly answer your questions about this free health and wellness tool.  If you are age 22 or older and want a member of your family to have access to your record, you must provide written consent by completing a proxy form available at our office. Please speak to our clinical staff about guidelines regarding accounts for patients younger than age 7.  As you activate your MyChart account and need any technical assistance, please call the MyChart technical support line at [phone]) or email your question to [email or web address]. If you email your question(s), please include your name, a return phone number and the best time to reach you.  If you have non-urgent health-related questions, you can send  a message to our office through MyChart at [email or web address]. If you have a medical emergency, call 911.  Thank you for using MyChart as your new health and wellness resource!   MyChart licensed from [employer],  4540-9811. Patents Pending.

## 2012-06-02 NOTE — Progress Notes (Signed)
HPI: This is a   very pleasant 22 year old woman who is here with her mother today.  She was having pains in abdomen that gradually worsened.  Unable to eat due to pain.  Was having a lot of diarrhea.  Non-bloody.  12/11 CT scan Apparent mild diffuse inflammation of the sigmoid colon, with poor characterization of the colonic wall. Small amount of free in the pelvis demonstrates peripheral soft tissue thickening. No definite abscess seen, but findings raise concern for mild colitis.  She was put on some sort of antibiotic.    Was perfectly well until about a year ago, that was diarrhea/vomiting for 2 days. Awoken in middle of the night with pain, vomiting, diarrhea  Went to [place] 3 weeks ago, pain in RLQ, felt hot, nauseas.  Very loose stools that day.  Since then continues to have loose stools, urgency +, frequency +, ++ nocturnal symptoms as well.  Bloody yesterday.  Was on abx for STD around the holidays, erythromycin  Third year in college, tearful at times at school.  She has lost about 20 pounds in the past year or so. Mom thinks that perhaps stress might be playing somewhat of a role no sick contacts.   Review of systems: Pertinent positive and negative review of systems were noted in the above HPI section. Complete review of systems was performed and was otherwise normal.    Past Medical History  Diagnosis Date  . Colitis     History reviewed. No pertinent past surgical history.  Current Outpatient Prescriptions  Medication Sig Dispense Refill  . Levonorgestrel-Ethinyl Estradiol (AMETHIA,CAMRESE) 0.15-0.03 &0.01 MG tablet Take 1 tablet by mouth daily.  1 Package  3  . Multiple Vitamins-Minerals (MULTIVITAMIN WITH MINERALS) tablet Take 1 tablet by mouth daily.         No current facility-administered medications for this visit.    Allergies as of 06/02/2012 - Review Complete 06/02/2012  Allergen Reaction Noted  . Amoxicillin  03/01/2009    Family History   Problem Relation Age of Onset  . Depression Mother   . Fibroids Mother   . Anemia Mother     History   Social History  . Marital Status: Single    Spouse Name: N/A    Number of Children: N/A  . Years of Education: N/A   Occupational History  . Not on file.   Social History Main Topics  . Smoking status: Never Smoker   . Smokeless tobacco: Never Used  . Alcohol Use: 1.0 oz/week    2 drink(s) per week     Comment: occasionally  . Drug Use: No  . Sexually Active: Not on file   Other Topics Concern  . Not on file   Social History Narrative   Regular exercise:  Yes, walks   Caffeine use:  2 glasses soda daily                   Physical Exam: BP 86/50  Pulse 65  Ht 5' 3.5" (1.613 m)  Wt 114 lb (51.71 kg)  BMI 19.87 kg/m2  LMP 04/27/2012 Constitutional: generally well-appearing Psychiatric: alert and oriented x3 Eyes: extraocular movements intact Mouth: oral pharynx moist, no lesions Neck: supple no lymphadenopathy Cardiovascular: heart regular rate and rhythm Lungs: clear to auscultation bilaterally Abdomen: soft, nontender, nondistended, no obvious ascites, no peritoneal signs, normal bowel sounds Extremities: no lower extremity edema bilaterally Skin: no lesions on visible extremities    Assessment and plan: 22 y.o. female with  recurrence diarrhea, recent weight loss, possible colitis on 60-year-old CT scan  I like to proceed with full colonoscopy at her soonest convenience. I think she might have underlying inflammatory bowel disease. She is to start one Imodium twice daily for now. Also getting stool testing, see below.

## 2012-06-08 ENCOUNTER — Encounter (HOSPITAL_COMMUNITY): Payer: Self-pay | Admitting: Pharmacy Technician

## 2012-06-08 ENCOUNTER — Other Ambulatory Visit: Payer: Managed Care, Other (non HMO) | Admitting: Gastroenterology

## 2012-06-08 ENCOUNTER — Other Ambulatory Visit: Payer: Managed Care, Other (non HMO)

## 2012-06-08 ENCOUNTER — Other Ambulatory Visit: Payer: Self-pay | Admitting: Gastroenterology

## 2012-06-08 DIAGNOSIS — R197 Diarrhea, unspecified: Secondary | ICD-10-CM

## 2012-06-09 ENCOUNTER — Encounter (HOSPITAL_COMMUNITY): Payer: Self-pay | Admitting: *Deleted

## 2012-06-09 LAB — CLOSTRIDIUM DIFFICILE BY PCR: Toxigenic C. Difficile by PCR: NOT DETECTED

## 2012-06-09 LAB — FECAL LACTOFERRIN, QUANT: Lactoferrin: NEGATIVE

## 2012-06-12 ENCOUNTER — Telehealth: Payer: Self-pay | Admitting: Gastroenterology

## 2012-06-12 LAB — STOOL CULTURE

## 2012-06-12 NOTE — Telephone Encounter (Signed)
Left message on machine to call back  

## 2012-06-15 NOTE — Telephone Encounter (Signed)
Left message on machine to call back  

## 2012-06-17 NOTE — Telephone Encounter (Signed)
Unable to reach pt per Noreene Larsson at [facility] pt was spoken to this week and did not mention cx appt.  Will leave her on for now.

## 2012-06-18 ENCOUNTER — Ambulatory Visit (HOSPITAL_COMMUNITY)
Admission: RE | Admit: 2012-06-18 | Payer: Managed Care, Other (non HMO) | Source: Ambulatory Visit | Admitting: Gastroenterology

## 2012-06-18 ENCOUNTER — Encounter (HOSPITAL_COMMUNITY): Payer: Self-pay | Admitting: Anesthesiology

## 2012-06-18 ENCOUNTER — Telehealth: Payer: Self-pay | Admitting: Gastroenterology

## 2012-06-18 SURGERY — COLONOSCOPY WITH PROPOFOL
Anesthesia: Monitor Anesthesia Care

## 2012-06-18 NOTE — Anesthesia Preprocedure Evaluation (Deleted)
Anesthesia Evaluation  Patient identified by MRN, date of birth, ID band Patient awake    Reviewed: Allergy & Precautions, H&P , NPO status , Patient's Chart, lab work & pertinent test results  Airway Mallampati: II TM Distance: >3 FB Neck ROM: Full    Dental no notable dental hx.    Pulmonary neg pulmonary ROS,  breath sounds clear to auscultation  Pulmonary exam normal       Cardiovascular negative cardio ROS  Rhythm:Regular Rate:Normal     Neuro/Psych negative neurological ROS  negative psych ROS   GI/Hepatic negative GI ROS, Neg liver ROS,   Endo/Other  negative endocrine ROS  Renal/GU negative Renal ROS  negative genitourinary   Musculoskeletal negative musculoskeletal ROS (+)   Abdominal   Peds negative pediatric ROS (+)  Hematology negative hematology ROS (+)   Anesthesia Other Findings   Reproductive/Obstetrics negative OB ROS                           Anesthesia Physical Anesthesia Plan  ASA: I  Anesthesia Plan: MAC   Post-op Pain Management:    Induction: Intravenous  Airway Management Planned: Nasal Cannula  Additional Equipment:   Intra-op Plan:   Post-operative Plan:   Informed Consent: I have reviewed the patients History and Physical, chart, labs and discussed the procedure including the risks, benefits and alternatives for the proposed anesthesia with the patient or authorized representative who has indicated his/her understanding and acceptance.     Plan Discussed with: CRNA and Surgeon  Anesthesia Plan Comments:         Anesthesia Quick Evaluation  

## 2012-06-18 NOTE — Telephone Encounter (Signed)
[facility] endo called her when she was late for her procedure.  Mother said that she is not coming, that she cancelled the appt yesterday.    Patty, please see if she is planning on rescheduling  Thanks

## 2012-06-18 NOTE — Telephone Encounter (Signed)
I tried on several occasions to reach the pt to confirm that she wanted to reschedule.  I left several messages and got no response.  I have left another message today to try and reschedule.

## 2012-06-18 NOTE — Telephone Encounter (Signed)
Ok, thanks.

## 2012-06-18 NOTE — Telephone Encounter (Signed)
Letter mailed to have pt call to reschedule procedure

## 2012-07-08 ENCOUNTER — Encounter: Payer: Managed Care, Other (non HMO) | Admitting: Gastroenterology

## 2012-07-13 ENCOUNTER — Telehealth: Payer: Self-pay | Admitting: Gastroenterology

## 2012-07-13 NOTE — Telephone Encounter (Signed)
Left message on machine to call back  

## 2012-07-14 ENCOUNTER — Telehealth: Payer: Self-pay

## 2012-07-14 NOTE — Telephone Encounter (Signed)
Pt has been rescheduled for 07/31/12 and previsit for 07/24/12 for propofol colon.

## 2012-07-14 NOTE — Telephone Encounter (Signed)
Unable to reach pt to reschedule several messages have been left letter was mailed

## 2012-07-24 ENCOUNTER — Encounter: Payer: Self-pay | Admitting: Family Medicine

## 2012-07-24 ENCOUNTER — Ambulatory Visit (AMBULATORY_SURGERY_CENTER): Payer: Managed Care, Other (non HMO)

## 2012-07-24 ENCOUNTER — Ambulatory Visit (INDEPENDENT_AMBULATORY_CARE_PROVIDER_SITE_OTHER): Payer: Managed Care, Other (non HMO) | Admitting: Family Medicine

## 2012-07-24 VITALS — Ht 63.0 in | Wt 116.8 lb

## 2012-07-24 VITALS — BP 90/62 | HR 72 | Temp 98.2°F | Wt 114.8 lb

## 2012-07-24 DIAGNOSIS — H60392 Other infective otitis externa, left ear: Secondary | ICD-10-CM

## 2012-07-24 DIAGNOSIS — T169XXA Foreign body in ear, unspecified ear, initial encounter: Secondary | ICD-10-CM

## 2012-07-24 DIAGNOSIS — H60399 Other infective otitis externa, unspecified ear: Secondary | ICD-10-CM

## 2012-07-24 DIAGNOSIS — K5190 Ulcerative colitis, unspecified, without complications: Secondary | ICD-10-CM

## 2012-07-24 DIAGNOSIS — T162XXA Foreign body in left ear, initial encounter: Secondary | ICD-10-CM

## 2012-07-24 MED ORDER — OFLOXACIN 0.3 % OT SOLN: 10.0000 [drp] | Freq: Every day | OTIC | Status: DC

## 2012-07-24 MED ORDER — MOVIPREP 100 G PO SOLR: ORAL | Status: DC

## 2012-07-24 NOTE — Patient Instructions (Addendum)
Otitis Externa Otitis externa is a bacterial or fungal infection of the outer ear canal. This is the area from the eardrum to the outside of the ear. Otitis externa is sometimes called "swimmer's ear." CAUSES  Possible causes of infection include:  Swimming in dirty water.  Moisture remaining in the ear after swimming or bathing.  Mild injury (trauma) to the ear.  Objects stuck in the ear (foreign body).  Cuts or scrapes (abrasions) on the outside of the ear. SYMPTOMS  The first symptom of infection is often itching in the ear canal. Later signs and symptoms may include swelling and redness of the ear canal, ear pain, and yellowish-white fluid (pus) coming from the ear. The ear pain may be worse when pulling on the earlobe. DIAGNOSIS  Your caregiver will perform a physical exam. A sample of fluid may be taken from the ear and examined for bacteria or fungi. TREATMENT  Antibiotic ear drops are often given for 10 to 14 days. Treatment may also include pain medicine or corticosteroids to reduce itching and swelling. PREVENTION   Keep your ear dry. Use the corner of a towel to absorb water out of the ear canal after swimming or bathing.  Avoid scratching or putting objects inside your ear. This can damage the ear canal or remove the protective wax that lines the canal. This makes it easier for bacteria and fungi to grow.  Avoid swimming in lakes, polluted water, or poorly chlorinated pools.  You may use ear drops made of rubbing alcohol and vinegar after swimming. Combine equal parts of white vinegar and alcohol in a bottle. Put 3 or 4 drops into each ear after swimming. HOME CARE INSTRUCTIONS   Apply antibiotic ear drops to the ear canal as prescribed by your caregiver.  Only take over-the-counter or prescription medicines for pain, discomfort, or fever as directed by your caregiver.  If you have diabetes, follow any additional treatment instructions from your caregiver.  Keep all  follow-up appointments as directed by your caregiver. SEEK MEDICAL CARE IF:   You have a fever.  Your ear is still red, swollen, painful, or draining pus after 3 days.  Your redness, swelling, or pain gets worse.  You have a severe headache.  You have redness, swelling, pain, or tenderness in the area behind your ear. MAKE SURE YOU:   Understand these instructions.  Will watch your condition.  Will get help right away if you are not doing well or get worse. Document Released: 03/04/2005 Document Revised: 05/27/2011 Document Reviewed: 03/21/2011 [employer] Patient Information 2013 [employer].  

## 2012-07-24 NOTE — Progress Notes (Signed)
  Subjective:     Holly Thornton is a 22 y.o. female who presents for evaluation of a foreign body in ear canal. It was first noticed several days ago. Symptoms: pain L ear. Marland Kitchen Here to have it removed.  The following portions of the patient's history were reviewed and updated as appropriate: allergies, current medications, past family history, past medical history, past social history, past surgical history and problem list.  Review of Systems Pertinent items are noted in HPI.    Objective:    BP 90/62  Pulse 72  Temp(Src) 98.2 F (36.8 C) (Oral)  Wt 114 lb 12.8 oz (52.073 kg)  BMI 20.01 kg/m2  SpO2 98% General: alert, cooperative, appears stated age and no distress  Exam:  Left ear: erythema and edema of ear canal and foreign body: hair      fb irrigated out with no complications Assessment:    Foreign body in ear canal with otitis externa   Plan:    Area was visualized. Anesthesia: na. Foreign body removed by flushing out with warm water. Patient tolerated procedure well. Follow up as needed.  floxin otic gtts

## 2012-07-27 ENCOUNTER — Encounter: Payer: Self-pay | Admitting: Gastroenterology

## 2012-07-31 ENCOUNTER — Ambulatory Visit (AMBULATORY_SURGERY_CENTER): Payer: Managed Care, Other (non HMO) | Admitting: Gastroenterology

## 2012-07-31 ENCOUNTER — Encounter: Payer: Self-pay | Admitting: Gastroenterology

## 2012-07-31 VITALS — BP 110/63 | HR 68 | Temp 98.0°F | Resp 19 | Ht 63.0 in | Wt 116.0 lb

## 2012-07-31 DIAGNOSIS — D126 Benign neoplasm of colon, unspecified: Secondary | ICD-10-CM

## 2012-07-31 DIAGNOSIS — R933 Abnormal findings on diagnostic imaging of other parts of digestive tract: Secondary | ICD-10-CM

## 2012-07-31 DIAGNOSIS — K5190 Ulcerative colitis, unspecified, without complications: Secondary | ICD-10-CM

## 2012-07-31 MED ORDER — SODIUM CHLORIDE 0.9 % IV SOLN: 500.0000 mL | INTRAVENOUS | Status: DC

## 2012-07-31 NOTE — Patient Instructions (Addendum)

## 2012-07-31 NOTE — Op Note (Signed)
[facility] [address] [place], [address]   COLONOSCOPY PROCEDURE REPORT  PATIENT: Holly, Thornton  MR#: [id number] BIRTHDATE: 1990-09-10 , 21  yrs. old GENDER: Female ENDOSCOPIST: Rachael Fee, MD REFERRED XB:[id number] Peggyann Juba, FNP PROCEDURE DATE:  07/31/2012 PROCEDURE:   Colonoscopy with biopsy ASA CLASS:   Class I INDICATIONS:loose stools, abnormal colon on CT 2 years ago (colitis?). MEDICATIONS: MAC sedation, administered by CRNA and propofol (Diprivan) 200mg  IV  DESCRIPTION OF PROCEDURE:   After the risks benefits and alternatives of the procedure were thoroughly explained, informed consent was obtained.  A digital rectal exam revealed no abnormalities of the rectum.   The LB [id number]  endoscope was introduced through the anus and advanced to the terminal ileum which was intubated for a short distance. No adverse events experienced.   The quality of the prep was good.  The instrument was then slowly withdrawn as the colon was fully examined.   COLON FINDINGS: The mucosa appeared normal in the terminal ileum. A normal appearing cecum, ileocecal valve, and appendiceal orifice were identified.  The ascending, hepatic flexure, transverse, splenic flexure, descending, sigmoid colon and rectum appeared unremarkable.  No polyps or cancers were seen.  Biopsies were taken randomly from normal appearing colon.  Retroflexed views revealed no abnormalities. The time to cecum=2 minutes 14 seconds. Withdrawal time=7 minutes 28 seconds.  The scope was withdrawn and the procedure completed. COMPLICATIONS: There were no complications.  ENDOSCOPIC IMPRESSION: 1.   Normal mucosa in the terminal ileum 2.   Normal colon; biopsied to check for microscopic colitis.  RECOMMENDATIONS: Await final pathology.   eSigned:  Rachael Fee, MD 07/31/2012 10:37 AM

## 2012-07-31 NOTE — Progress Notes (Signed)
Patient did not experience any of the following events: a burn prior to discharge; a fall within the facility; wrong site/side/patient/procedure/implant event; or a hospital transfer or hospital admission upon discharge from the facility. (G8907) Patient did not have preoperative order for IV antibiotic SSI prophylaxis. (G8918)  

## 2012-07-31 NOTE — Progress Notes (Signed)
Called to room to assist during endoscopic procedure.  Patient ID and intended procedure confirmed with present staff. Received instructions for my participation in the procedure from the performing physician. ewm 

## 2012-08-03 ENCOUNTER — Telehealth: Payer: Self-pay | Admitting: *Deleted

## 2012-08-03 NOTE — Telephone Encounter (Signed)
  Follow up Call-  Call back number 07/31/2012  Post procedure Call Back phone  # [phone]  Permission to leave phone message Yes     No answer,left message.

## 2012-08-06 ENCOUNTER — Encounter: Payer: Self-pay | Admitting: Gastroenterology

## 2012-09-29 ENCOUNTER — Telehealth: Payer: Self-pay | Admitting: Family

## 2012-09-29 NOTE — Telephone Encounter (Signed)
Patient states that she needs her immunization records(especially booster) to be faxed over to her college.  [employer]  Fax: [phone]

## 2012-09-29 NOTE — Telephone Encounter (Signed)
Immunization report printed from [facility] and faxed to number below. Notified pt.

## 2012-10-09 ENCOUNTER — Encounter: Payer: Self-pay | Admitting: Family

## 2012-10-09 ENCOUNTER — Ambulatory Visit (INDEPENDENT_AMBULATORY_CARE_PROVIDER_SITE_OTHER): Payer: Managed Care, Other (non HMO) | Admitting: Family

## 2012-10-09 VITALS — BP 104/70 | HR 71 | Temp 98.0°F | Resp 16 | Ht 64.0 in | Wt 116.1 lb

## 2012-10-09 DIAGNOSIS — Z309 Encounter for contraceptive management, unspecified: Secondary | ICD-10-CM

## 2012-10-09 DIAGNOSIS — Z0000 Encounter for general adult medical examination without abnormal findings: Secondary | ICD-10-CM

## 2012-10-09 LAB — CBC WITH DIFFERENTIAL/PLATELET
Eosinophils Absolute: 0.1 10*3/uL (ref 0.0–0.7)
Hemoglobin: 13.8 g/dL (ref 12.0–15.0)
Lymphocytes Relative: 38 % (ref 12–46)
Lymphs Abs: 2.2 10*3/uL (ref 0.7–4.0)
MCH: 28.1 pg (ref 26.0–34.0)
Monocytes Relative: 10 % (ref 3–12)
Neutro Abs: 2.8 10*3/uL (ref 1.7–7.7)
Neutrophils Relative %: 50 % (ref 43–77)
RBC: 4.91 MIL/uL (ref 3.87–5.11)
WBC: 5.7 10*3/uL (ref 4.0–10.5)

## 2012-10-09 LAB — URINALYSIS, ROUTINE W REFLEX MICROSCOPIC
Ketones, ur: 15 mg/dL — AB
Nitrite: NEGATIVE
Specific Gravity, Urine: 1.028 (ref 1.005–1.030)
pH: 5.5 (ref 5.0–8.0)

## 2012-10-09 LAB — HCG, SERUM, QUALITATIVE: Preg, Serum: NEGATIVE

## 2012-10-09 LAB — TSH: TSH: 0.792 u[IU]/mL (ref 0.350–4.500)

## 2012-10-09 MED ORDER — NORETHINDRONE ACET-ETHINYL EST 1.5-30 MG-MCG PO TABS: 1.0000 | ORAL_TABLET | Freq: Every day | ORAL | Status: DC

## 2012-10-09 NOTE — Progress Notes (Signed)
Subjective:    Patient ID: Holly Thornton, female    DOB: 06-20-1990, 22 y.o.   MRN: [id number]  HPI  Ms.  Thornton is a 22 yr old female who presents today for cpx.  Diet: reports that she drinks a lot of "naked drinks"  Exercise: reports that she has been going to the GYM, active at work. Immunizations: Mom will bring [place] a shot records.  She would like to complete HPV vaccine if it is not complete. She is unsure.   Review of Systems  Constitutional: Negative for unexpected weight change.  HENT: Negative for hearing loss and congestion.   Eyes: Negative for visual disturbance.  Respiratory: Negative for cough.   Cardiovascular: Negative for leg swelling.  Gastrointestinal:       Diarrhea after dairy  Genitourinary: Negative for dysuria and menstrual problem.  Neurological:       Occasional headaches  Hematological: Negative for adenopathy.  Psychiatric/Behavioral:       Denies depression/anxiety    Past Medical History  Diagnosis Date  . Colitis   . Asthma     child    History   Social History  . Marital Status: Single    Spouse Name: N/A    Number of Children: N/A  . Years of Education: N/A   Occupational History  . Not on file.   Social History Main Topics  . Smoking status: Never Smoker   . Smokeless tobacco: Never Used  . Alcohol Use: 0.5 oz/week    1 drink(s) per week     Comment: occasionally  . Drug Use: No  . Sexually Active: Not on file   Other Topics Concern  . Not on file   Social History Narrative   Regular exercise:  Yes, walks   Caffeine use:  2 glasses soda daily                Past Surgical History  Procedure Laterality Date  . No past surgeries      Family History  Problem Relation Age of Onset  . Depression Mother   . Fibroids Mother   . Anemia Mother   . Diabetes Maternal Grandmother     Allergies  Allergen Reactions  . Amoxicillin Swelling    Current Outpatient Prescriptions on File Prior to Visit   Medication Sig Dispense Refill  . Multiple Vitamins-Minerals (MULTIVITAMIN WITH MINERALS) tablet Take 1 tablet by mouth daily.         No current facility-administered medications on file prior to visit.    BP 104/70  Pulse 71  Temp(Src) 98 F (36.7 C) (Oral)  Resp 16  Ht 5\' 4"  (1.626 m)  Wt 116 lb 1.3 oz (52.654 kg)  BMI 19.92 kg/m2  SpO2 99%       Objective:   Physical Exam  Physical Exam  Constitutional: She is oriented to person, place, and time. She appears well-developed and well-nourished. No distress.  HENT:  Head: Normocephalic and atraumatic.  Right Ear: Tympanic membrane and ear canal normal.  Left Ear: Tympanic membrane and ear canal normal.  Mouth/Throat: Oropharynx is clear and moist.  Eyes: Pupils are equal, round, and reactive to light. No scleral icterus.  Neck: Normal range of motion. No thyromegaly present.  Cardiovascular: Normal rate and regular rhythm.   No murmur heard. Pulmonary/Chest: Effort normal and breath sounds normal. No respiratory distress. He has no wheezes. She has no rales. She exhibits no tenderness.  Abdominal: Soft. Bowel sounds are normal. He exhibits  no distension and no mass. There is no tenderness. There is no rebound and no guarding.  Musculoskeletal: She exhibits no edema.  Lymphadenopathy:    She has no cervical adenopathy.  Neurological: She is alert and oriented to person, place, and time. She exhibits normal muscle tone. Coordination normal.  Skin: Skin is warm and dry.  Psychiatric: She has a normal mood and affect. Her behavior is normal. Judgment and thought content normal.        Assessment & Plan:          Assessment & Plan:

## 2012-10-09 NOTE — Assessment & Plan Note (Signed)
Continue healthy diet, exercise. Will review immunization records- mom will fax on Monday.  Td up to date.  Obtain lab work.  Restart OCP.  Plan annual pap in January.

## 2012-10-09 NOTE — Patient Instructions (Addendum)
Please complete your lab work prior to leaving. Follow up in 6 months.  

## 2012-10-12 ENCOUNTER — Encounter: Payer: Self-pay | Admitting: Family

## 2012-11-03 ENCOUNTER — Telehealth: Payer: Self-pay | Admitting: Family

## 2012-11-03 NOTE — Telephone Encounter (Signed)
Patient wants to know when we will get the flu shots in?

## 2012-11-12 NOTE — Telephone Encounter (Signed)
Patient scheduled nurse visit for flu shot for 11/18/12

## 2012-11-12 NOTE — Telephone Encounter (Signed)
Adult flu shots are here. Please call pt and arrange time for her to come in for flu vaccine.  Thanks

## 2012-11-18 ENCOUNTER — Ambulatory Visit: Payer: Managed Care, Other (non HMO)

## 2012-12-10 ENCOUNTER — Encounter: Payer: Self-pay | Admitting: Physician Assistant

## 2012-12-10 ENCOUNTER — Ambulatory Visit (INDEPENDENT_AMBULATORY_CARE_PROVIDER_SITE_OTHER): Payer: Managed Care, Other (non HMO) | Admitting: Physician Assistant

## 2012-12-10 VITALS — BP 108/68 | HR 82 | Temp 98.8°F | Resp 16 | Ht 64.0 in | Wt 119.5 lb

## 2012-12-10 DIAGNOSIS — M62830 Muscle spasm of back: Secondary | ICD-10-CM | POA: Insufficient documentation

## 2012-12-10 DIAGNOSIS — Z23 Encounter for immunization: Secondary | ICD-10-CM

## 2012-12-10 DIAGNOSIS — M5380 Other specified dorsopathies, site unspecified: Secondary | ICD-10-CM

## 2012-12-10 MED ORDER — CYCLOBENZAPRINE HCL 10 MG PO TABS: ORAL_TABLET | ORAL | Status: DC

## 2012-12-10 MED ORDER — IBUPROFEN 200 MG PO TABS
200.0000 mg | ORAL_TABLET | Freq: Once | ORAL | Status: AC
  Administered 2012-12-10: 200 mg via ORAL

## 2012-12-10 NOTE — Assessment & Plan Note (Signed)
Immunization given by nursing staff. 

## 2012-12-10 NOTE — Assessment & Plan Note (Signed)
Rx Flexeril.  Ibuprofen/Tylenol for pain.  Ice compresses to area to alternate with moist heat.  Topical aspercreme.  Rest.  Avoid overuse or carrying heavy items with LUE

## 2012-12-10 NOTE — Progress Notes (Signed)
Patient ID: Holly Thornton, female   DOB: 09-15-1990, 22 y.o.   MRN: [id number]  Patient presents to clinic today c/o left-sided thoracic back pain x 2 days.  Patient states she noticed pain with movement yesterday.  Denies trauma, injury, history of back pain.  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  Took some ibuprofen with moderate relief of symptoms.  States she then woke up this am with return of pain, especially with motion.  States back feels stiff.  Pain is throbbing and non-radiating.  Denies decrease in range of motion.    Patient also requests influenza immunization.  Past Medical History  Diagnosis Date  . Colitis   . Asthma     child    Current Outpatient Prescriptions on File Prior to Visit  Medication Sig Dispense Refill  . Multiple Vitamins-Minerals (MULTIVITAMIN WITH MINERALS) tablet Take 1 tablet by mouth daily.        . Norethindrone Acetate-Ethinyl Estradiol (JUNEL,LOESTRIN,MICROGESTIN) 1.5-30 MG-MCG tablet Take 1 tablet by mouth daily.  1 Package  5   No current facility-administered medications on file prior to visit.    Allergies  Allergen Reactions  . Amoxicillin Swelling    Family History  Problem Relation Age of Onset  . Depression Mother   . Fibroids Mother   . Anemia Mother   . Diabetes Maternal Grandmother     History   Social History  . Marital Status: Single    Spouse Name: N/A    Number of Children: N/A  . Years of Education: N/A   Social History Main Topics  . Smoking status: Never Smoker   . Smokeless tobacco: Never Used  . Alcohol Use: 0.5 oz/week    1 drink(s) per week     Comment: occasionally  . Drug Use: No  . Sexual Activity: None   Other Topics Concern  . None   Social History Narrative   Regular exercise:  Yes, walks   Caffeine use:  2 glasses soda daily               ROS See HPI.  All other ROS are negative.   Filed  Vitals:   12/10/12 1152  BP: 108/68  Pulse: 82  Temp: 98.8 F (37.1 C)  Resp: 16   Physical Exam  Constitutional: She is oriented to person, place, and time and well-developed, well-nourished, and in no distress.  HENT:  Head: Normocephalic and atraumatic.  Eyes: Conjunctivae are normal.  Neck: Neck supple.  Cardiovascular: Normal rate, regular rhythm and normal heart sounds.   Pulmonary/Chest: Effort normal and breath sounds normal. No respiratory distress. She has no wheezes. She has no rales. She exhibits no tenderness.  Musculoskeletal:  Pain with palpation of left thoracic perispinal musculature. Note spasm on exam.  ROM is normal but exacerbates pain.  Neurological: She is alert and oriented to person, place, and time. No cranial nerve deficit.  Skin: Skin is warm and dry. No rash noted.     Recent Results (from the past 2160 hour(s))  TSH     Status: None   Collection Time    10/09/12 11:45 AM      Result Value Range   TSH 0.792  0.350 - 4.500 uIU/mL  URINALYSIS, ROUTINE W REFLEX MICROSCOPIC     Status: Abnormal   Collection Time    10/09/12 11:45 AM  Result Value Range   Color, Urine YELLOW  YELLOW   APPearance CLEAR  CLEAR   Specific Gravity, Urine 1.028  1.005 - 1.030   pH 5.5  5.0 - 8.0   Glucose, UA NEG  NEG mg/dL   Bilirubin Urine NEG  NEG   Ketones, ur 15 (*) NEG mg/dL   Hgb urine dipstick NEG  NEG   Protein, ur NEG  NEG mg/dL   Urobilinogen, UA 0.2  0.0 - 1.0 mg/dL   Nitrite NEG  NEG   Leukocytes, UA NEG  NEG  CBC WITH DIFFERENTIAL     Status: None   Collection Time    10/09/12 11:45 AM      Result Value Range   WBC 5.7  4.0 - 10.5 K/uL   RBC 4.91  3.87 - 5.11 MIL/uL   Hemoglobin 13.8  12.0 - 15.0 g/dL   HCT 16.1  09.6 - 04.5 %   MCV 83.9  78.0 - 100.0 fL   MCH 28.1  26.0 - 34.0 pg   MCHC 33.5  30.0 - 36.0 g/dL   RDW 40.9  81.1 - 91.4 %   Platelets 288  150 - 400 K/uL   Neutrophils Relative % 50  43 - 77 %   Neutro Abs 2.8  1.7 - 7.7 K/uL    Lymphocytes Relative 38  12 - 46 %   Lymphs Abs 2.2  0.7 - 4.0 K/uL   Monocytes Relative 10  3 - 12 %   Monocytes Absolute 0.6  0.1 - 1.0 K/uL   Eosinophils Relative 2  0 - 5 %   Eosinophils Absolute 0.1  0.0 - 0.7 K/uL   Basophils Relative 0  0 - 1 %   Basophils Absolute 0.0  0.0 - 0.1 K/uL   Smear Review Criteria for review not met    HCG, SERUM, QUALITATIVE     Status: None   Collection Time    10/09/12 11:45 AM      Result Value Range   Preg, Serum NEG      Assessment/Plan: Muscle spasm of back Rx Flexeril.  Ibuprofen/Tylenol for pain.  Ice compresses to area to alternate with moist heat.  Topical aspercreme.  Rest.  Avoid overuse or carrying heavy items with LUE  Need for prophylactic vaccination and inoculation against influenza Immunization given by nursing staff.

## 2012-12-10 NOTE — Patient Instructions (Signed)
Please use muscle relaxer as needed for relief of pain -- 1/2 tablet in the morning and 1 tablet at bedtime.  Alternate between ibuprofen and tylenol for pain relief.  I encourage topical pain relief such as Aspercreme or Salon Pas.  Alternate Ice compresses and moist heat.  Avoid overuse of back and/or left arm and shoulder.  Pain should gradual improve over the next week.  If symptoms persist or acutely worsen, please call or return to clinic.  Muscle Strain Muscle strain occurs when a muscle is stretched beyond its normal length. A small number of muscle fibers generally are torn. This is especially common in athletes. This happens when a sudden, violent force placed on a muscle stretches it too far. Usually, recovery from muscle strain takes 1 to 2 weeks. Complete healing will take 5 to 6 weeks.  HOME CARE INSTRUCTIONS   While awake, apply ice to the sore muscle for the first 2 days after the injury.  Put ice in a plastic bag.  Place a towel between your skin and the bag.  Leave the ice on for 15-20 minutes each hour.  Do not use the strained muscle for several days, until you no longer have pain.  You may wrap the injured area with an elastic bandage for comfort. Be careful not to wrap it too tightly. This may interfere with blood circulation or increase swelling.  Only take over-the-counter or prescription medicines for pain, discomfort, or fever as directed by your caregiver. SEEK MEDICAL CARE IF:  You have increasing pain or swelling in the injured area. MAKE SURE YOU:   Understand these instructions.  Will watch your condition.  Will get help right away if you are not doing well or get worse. Document Released: 03/04/2005 Document Revised: 05/27/2011 Document Reviewed: 03/16/2011 [facility] Patient Information 2014 [place], [place].

## 2012-12-21 ENCOUNTER — Other Ambulatory Visit: Payer: Self-pay | Admitting: *Deleted

## 2012-12-21 MED ORDER — NORETHINDRONE ACET-ETHINYL EST 1.5-30 MG-MCG PO TABS: 1.0000 | ORAL_TABLET | Freq: Every day | ORAL | Status: DC

## 2012-12-21 NOTE — Progress Notes (Signed)
90-day request; Rx request to pharmacy/SLS

## 2013-01-08 ENCOUNTER — Ambulatory Visit: Payer: Managed Care, Other (non HMO) | Admitting: Family

## 2013-02-24 ENCOUNTER — Emergency Department (INDEPENDENT_AMBULATORY_CARE_PROVIDER_SITE_OTHER)
Admission: EM | Admit: 2013-02-24 | Discharge: 2013-02-24 | Disposition: A | Discharge status: Home / Self Care | Payer: Managed Care, Other (non HMO) | Source: Home / Self Care | Attending: Emergency Medicine | Admitting: Emergency Medicine

## 2013-02-24 ENCOUNTER — Encounter (HOSPITAL_COMMUNITY): Payer: Self-pay | Admitting: Emergency Medicine

## 2013-02-24 DIAGNOSIS — A088 Other specified intestinal infections: Secondary | ICD-10-CM

## 2013-02-24 DIAGNOSIS — A084 Viral intestinal infection, unspecified: Secondary | ICD-10-CM

## 2013-02-24 LAB — POCT URINALYSIS DIP (DEVICE)
Glucose, UA: NEGATIVE mg/dL
Protein, ur: 100 mg/dL — AB
Specific Gravity, Urine: >=1.03 (ref 1.005–1.030)
Urobilinogen, UA: 0.2 mg/dL (ref 0.0–1.0)

## 2013-02-24 LAB — POCT PREGNANCY, URINE: Preg Test, Ur: NEGATIVE

## 2013-02-24 MED ORDER — ONDANSETRON 4 MG PO TBDP
ORAL_TABLET | ORAL | Status: AC
  Filled 2013-02-24: qty 2

## 2013-02-24 MED ORDER — ONDANSETRON 4 MG PO TBDP
8.0000 mg | ORAL_TABLET | Freq: Once | ORAL | Status: AC
  Administered 2013-02-24: 8 mg via ORAL

## 2013-02-24 MED ORDER — GI COCKTAIL ~~LOC~~
ORAL | Status: AC
  Filled 2013-02-24: qty 30

## 2013-02-24 MED ORDER — ONDANSETRON HCL 8 MG PO TABS: 8.0000 mg | ORAL_TABLET | Freq: Three times a day (TID) | ORAL | Status: DC | PRN

## 2013-02-24 MED ORDER — ESOMEPRAZOLE STRONTIUM 49.3 MG PO CPDR: 1.0000 | DELAYED_RELEASE_CAPSULE | Freq: Every day | ORAL | Status: DC

## 2013-02-24 MED ORDER — GI COCKTAIL ~~LOC~~
30.0000 mL | Freq: Once | ORAL | Status: AC
  Administered 2013-02-24: 30 mL via ORAL

## 2013-02-24 NOTE — ED Notes (Signed)
Bed: UC01 Expected date:  Expected time:  Means of arrival:  Comments: Nixed at 1040

## 2013-02-24 NOTE — ED Provider Notes (Signed)
Holly Thornton is a 22 y.o. female who presents to Urgent Care today for abdominal pain and vomiting since yesterday. Patient has nonbloody nonbilious vomit. She denies any diarrhea. She denies any fevers chills chest pain cough or congestion. She has not tried any medications yet. Her roommate is also ill. No dysuria symptoms   Past Medical History  Diagnosis Date  . Colitis   . Asthma     child   History  Substance Use Topics  . Smoking status: Never Smoker   . Smokeless tobacco: Never Used  . Alcohol Use: 0.5 oz/week    1 drink(s) per week     Comment: occasionally   ROS as above Medications reviewed. No current facility-administered medications for this encounter.   Current Outpatient Prescriptions  Medication Sig Dispense Refill  . cyclobenzaprine (FLEXERIL) 10 MG tablet Take 1/2 tablet in the am and 1 tablet in the evening for relief of symptoms  20 tablet  0  . Esomeprazole Strontium 49.3 MG CPDR Take 1 tablet by mouth daily.  30 capsule  0  . ibuprofen (ADVIL,MOTRIN) 200 MG tablet Take 200 mg by mouth every 6 (six) hours as needed for pain.      . Multiple Vitamins-Minerals (MULTIVITAMIN WITH MINERALS) tablet Take 1 tablet by mouth daily.        . Norethindrone Acetate-Ethinyl Estradiol (JUNEL,LOESTRIN,MICROGESTIN) 1.5-30 MG-MCG tablet Take 1 tablet by mouth daily.  3 Package  1  . ondansetron (ZOFRAN) 8 MG tablet Take 1 tablet (8 mg total) by mouth every 8 (eight) hours as needed for nausea or vomiting.  20 tablet  0    Exam:  BP 118/80  Pulse 70  Temp(Src) 98.6 F (37 C) (Oral)  Resp 20  SpO2 100% Gen: Well NAD, nontoxic appearing.  HEENT: EOMI,  MMM Lungs: Normal work of breathing. CTABL Heart: RRR no MRG Abd: NABS, Soft. NT, ND, no rebound or guarding. No CVA tenderness to percussion Exts: Non edematous BL  LE, warm and well perfused.   Patient was given Zofran and a GI cocktail and had some improvement in symptoms  Results for orders placed during the  hospital encounter of 02/24/13 (from the past 24 hour(s))  POCT URINALYSIS DIP (DEVICE)     Status: Abnormal   Collection Time    02/24/13 11:52 AM      Result Value Range   Glucose, UA NEGATIVE  NEGATIVE mg/dL   Bilirubin Urine SMALL (*) NEGATIVE   Ketones, ur >=160 (*) NEGATIVE mg/dL   Specific Gravity, Urine >=1.030  1.005 - 1.030   Hgb urine dipstick LARGE (*) NEGATIVE   pH 6.0  5.0 - 8.0   Protein, ur 100 (*) NEGATIVE mg/dL   Urobilinogen, UA 0.2  0.0 - 1.0 mg/dL   Nitrite NEGATIVE  NEGATIVE   Leukocytes, UA TRACE (*) NEGATIVE  POCT PREGNANCY, URINE     Status: None   Collection Time    02/24/13 11:56 AM      Result Value Range   Preg Test, Ur NEGATIVE  NEGATIVE   No results found.  Assessment and Plan: 22 y.o. female with likely viral gastroenteritis. Plan for symptomatic management with Zofran and oral fluids. Additionally we'll use a proton pump inhibitor. If worsening presented to the emergency room or if not improved followup with primary care provider or back here.  Discussed warning signs or symptoms. Please see discharge instructions. Patient expresses understanding.      Rodolph Bong, MD 02/24/13 [phone]

## 2013-02-24 NOTE — ED Notes (Signed)
Dr. Denyse Amass is in the room assessing the pt Pt c/o abd pain onset last night w/vomiting  Denies: cold sxs, fevers LMP = Aug 201 She is alert w/no signs of acute distress.

## 2013-04-27 ENCOUNTER — Other Ambulatory Visit: Payer: Self-pay | Admitting: Family

## 2013-04-27 NOTE — Telephone Encounter (Signed)
Informed patient of medication refill and she scheduled appointment for 05/12/13

## 2013-04-27 NOTE — Telephone Encounter (Signed)
Refill sent for pt's Junel. Pt was due for 6 month f/u in January.  Please call pt to arrange appt.

## 2013-05-12 ENCOUNTER — Ambulatory Visit: Payer: Managed Care, Other (non HMO) | Admitting: Family

## 2013-07-23 ENCOUNTER — Ambulatory Visit (INDEPENDENT_AMBULATORY_CARE_PROVIDER_SITE_OTHER): Payer: Managed Care, Other (non HMO) | Admitting: Family

## 2013-07-23 ENCOUNTER — Encounter: Payer: Self-pay | Admitting: Family

## 2013-07-23 ENCOUNTER — Other Ambulatory Visit (HOSPITAL_COMMUNITY)
Admission: RE | Admit: 2013-07-23 | Discharge: 2013-07-23 | Disposition: A | Discharge status: Home / Self Care | Payer: Managed Care, Other (non HMO) | Source: Ambulatory Visit | Attending: Family | Admitting: Family

## 2013-07-23 VITALS — BP 120/80 | HR 67 | Temp 98.2°F | Resp 16 | Ht 64.0 in | Wt 131.0 lb

## 2013-07-23 DIAGNOSIS — Z113 Encounter for screening for infections with a predominantly sexual mode of transmission: Secondary | ICD-10-CM | POA: Diagnosis present

## 2013-07-23 DIAGNOSIS — Z01419 Encounter for gynecological examination (general) (routine) without abnormal findings: Secondary | ICD-10-CM | POA: Insufficient documentation

## 2013-07-23 DIAGNOSIS — Z1151 Encounter for screening for human papillomavirus (HPV): Secondary | ICD-10-CM | POA: Insufficient documentation

## 2013-07-23 DIAGNOSIS — Z309 Encounter for contraceptive management, unspecified: Secondary | ICD-10-CM

## 2013-07-23 LAB — POCT URINE HCG BY VISUAL COLOR COMPARISON TESTS: Preg Test, Ur: NEGATIVE

## 2013-07-23 MED ORDER — NORETHINDRONE ACET-ETHINYL EST 1.5-30 MG-MCG PO TABS: ORAL_TABLET | ORAL | Status: DC

## 2013-07-23 NOTE — Progress Notes (Signed)
   Subjective:    Patient ID: Holly Thornton, female    DOB: Jun 18, 1990, 23 y.o.   MRN: [id number]  HPI  Holly Thornton is a 23 yr old female who presents today for Pap smear and contraceptive management.  She reports tolerating Junel without difficulty. Notes occasional spotting if she misses a dose.  Has had the same female partner "for a while.  LMP 07/05/13.  Review of Systems See HPI  Past Medical History  Diagnosis Date  . Colitis   . Asthma     child    History   Social History  . Marital Status: Single    Spouse Name: N/A    Number of Children: N/A  . Years of Education: N/A   Occupational History  . Not on file.   Social History Main Topics  . Smoking status: Never Smoker   . Smokeless tobacco: Never Used  . Alcohol Use: 0.5 oz/week    1 drink(s) per week     Comment: occasionally  . Drug Use: No  . Sexual Activity: Not on file   Other Topics Concern  . Not on file   Social History Narrative   Regular exercise:  Yes, walks   Caffeine use:  2 glasses soda daily                Past Surgical History  Procedure Laterality Date  . No past surgeries      Family History  Problem Relation Age of Onset  . Depression Mother   . Fibroids Mother   . Anemia Mother   . Diabetes Maternal Grandmother     Allergies  Allergen Reactions  . Amoxicillin Swelling    Current Outpatient Prescriptions on File Prior to Visit  Medication Sig Dispense Refill  . Multiple Vitamins-Minerals (MULTIVITAMIN WITH MINERALS) tablet Take 1 tablet by mouth daily.         No current facility-administered medications on file prior to visit.    BP 120/80  Pulse 67  Temp(Src) 98.2 F (36.8 C) (Oral)  Resp 16  Ht 5\' 4"  (1.626 m)  Wt 131 lb (59.421 kg)  BMI 22.47 kg/m2  SpO2 97%       Objective:   Physical Exam  Constitutional: She is oriented to person, place, and time. She appears well-developed and well-nourished. No distress.  Cardiovascular: Normal rate and  regular rhythm.   No murmur heard. Pulmonary/Chest: Effort normal and breath sounds normal. No respiratory distress. She has no wheezes. She has no rales. She exhibits no tenderness.  Genitourinary:  Breasts: Examined lying and sitting.  Right: Without masses, retractions, discharge or axillary adenopathy.  Left: Without masses, retractions, discharge or axillary adenopathy.  Inguinal/mons: Normal without inguinal adenopathy  External genitalia: Normal  BUS/Urethra/Skene's glands: Normal  Bladder: Normal  Vagina: Normal  Cervix: Normal  Uterus: normal in size, shape and contour. Midline and mobile  Adnexa/parametria:  Rt: Without masses or tenderness.  Lt: Without masses or tenderness.  Anus and perineum: Normal    Neurological: She is alert and oriented to person, place, and time.  Psychiatric: She has a normal mood and affect. Her behavior is normal. Judgment and thought content normal.          Assessment & Plan:

## 2013-07-23 NOTE — Patient Instructions (Signed)
Follow up in 1 year, sooner if problems/concerns.

## 2013-07-23 NOTE — Progress Notes (Signed)
Pre visit review using our clinic review tool, if applicable. No additional management support is needed unless otherwise documented below in the visit note. 

## 2013-07-24 DIAGNOSIS — Z309 Encounter for contraceptive management, unspecified: Secondary | ICD-10-CM | POA: Insufficient documentation

## 2013-07-24 DIAGNOSIS — Z01419 Encounter for gynecological examination (general) (routine) without abnormal findings: Secondary | ICD-10-CM | POA: Insufficient documentation

## 2013-07-24 NOTE — Addendum Note (Signed)
Addended by: Debbrah Alar on: 07/24/2013 10:56 PM   Modules accepted: Level of Service

## 2013-07-24 NOTE — Assessment & Plan Note (Signed)
Urine HCG neg, continue Junel.

## 2013-07-24 NOTE — Assessment & Plan Note (Signed)
Pap performed today.  Pt counseled on BSE.

## 2013-07-27 ENCOUNTER — Encounter: Payer: Self-pay | Admitting: Family

## 2013-09-28 ENCOUNTER — Encounter (HOSPITAL_COMMUNITY): Payer: Self-pay | Admitting: Emergency Medicine

## 2013-09-28 ENCOUNTER — Telehealth: Payer: Self-pay | Admitting: Family

## 2013-09-28 ENCOUNTER — Emergency Department (INDEPENDENT_AMBULATORY_CARE_PROVIDER_SITE_OTHER)
Admission: EM | Admit: 2013-09-28 | Discharge: 2013-09-28 | Disposition: A | Discharge status: Home / Self Care | Payer: Managed Care, Other (non HMO) | Source: Home / Self Care | Attending: Family Medicine | Admitting: Family Medicine

## 2013-09-28 ENCOUNTER — Other Ambulatory Visit (HOSPITAL_COMMUNITY)
Admission: RE | Admit: 2013-09-28 | Discharge: 2013-09-28 | Disposition: A | Discharge status: Home / Self Care | Payer: Managed Care, Other (non HMO) | Source: Ambulatory Visit | Attending: Family Medicine | Admitting: Family Medicine

## 2013-09-28 DIAGNOSIS — N760 Acute vaginitis: Secondary | ICD-10-CM | POA: Insufficient documentation

## 2013-09-28 DIAGNOSIS — N390 Urinary tract infection, site not specified: Secondary | ICD-10-CM

## 2013-09-28 DIAGNOSIS — Z113 Encounter for screening for infections with a predominantly sexual mode of transmission: Secondary | ICD-10-CM | POA: Diagnosis present

## 2013-09-28 LAB — POCT URINALYSIS DIP (DEVICE)
GLUCOSE, UA: 100 mg/dL — AB
Ketones, ur: 80 mg/dL — AB
NITRITE: POSITIVE — AB
Protein, ur: 100 mg/dL — AB
Specific Gravity, Urine: 1.015 (ref 1.005–1.030)
Urobilinogen, UA: 4 mg/dL — ABNORMAL HIGH (ref 0.0–1.0)
pH: 5 (ref 5.0–8.0)

## 2013-09-28 LAB — HIV ANTIBODY (ROUTINE TESTING W REFLEX): HIV 1&2 Ab, 4th Generation: NONREACTIVE

## 2013-09-28 LAB — RPR

## 2013-09-28 MED ORDER — HYDROCODONE-ACETAMINOPHEN 5-325 MG PO TABS: 1.0000 | ORAL_TABLET | Freq: Four times a day (QID) | ORAL | Status: DC | PRN

## 2013-09-28 MED ORDER — NITROFURANTOIN MONOHYD MACRO 100 MG PO CAPS: 100.0000 mg | ORAL_CAPSULE | Freq: Two times a day (BID) | ORAL | Status: DC

## 2013-09-28 NOTE — ED Notes (Signed)
Reports vaginal pain that started Friday.  Intermittent back pain, no abdominal pain, pain with urination.  Denies any history of feeling like this.  Patient describes "cuts" at perineum area.

## 2013-09-28 NOTE — ED Notes (Signed)
Vaginal pain that started friday

## 2013-09-28 NOTE — ED Provider Notes (Signed)
CSN: [id number]     Arrival date & time 09/28/13  0908 History   First MD Initiated Contact with Patient 09/28/13 [phone]     Chief Complaint  Patient presents with  . Vaginal Pain   (Consider location/radiation/quality/duration/timing/severity/associated sxs/prior Treatment) HPI Comments: Denies previous episodes of same.  PCP: Dr. Elvera Maria LNMP: 09-04-2013   Patient is a 23 y.o. female presenting with vaginal pain. The history is provided by the patient.  Vaginal Pain This is a new problem. Episode onset: sx began 5 days ago. The problem occurs constantly. The problem has been gradually worsening. Associated symptoms comments: +vaginal discharge and vaginal ulcerations.    Past Medical History  Diagnosis Date  . Colitis   . Asthma     child   Past Surgical History  Procedure Laterality Date  . No past surgeries     Family History  Problem Relation Age of Onset  . Depression Mother   . Fibroids Mother   . Anemia Mother   . Diabetes Maternal Grandmother    History  Substance Use Topics  . Smoking status: Never Smoker   . Smokeless tobacco: Never Used  . Alcohol Use: 0.5 oz/week    1 drink(s) per week     Comment: occasionally   OB History   Grav Para Term Preterm Abortions TAB SAB Ect Mult Living                 Review of Systems  Genitourinary: Positive for vaginal pain.  All other systems reviewed and are negative.   Allergies  Amoxicillin  Home Medications   Prior to Admission medications   Medication Sig Start Date End Date Taking? Authorizing Provider  HYDROcodone-acetaminophen (NORCO/VICODIN) 5-325 MG per tablet Take 1-2 tablets by mouth every 6 (six) hours as needed for moderate pain or severe pain. 09/28/13   Lahoma Rocker, PA  Multiple Vitamins-Minerals (MULTIVITAMIN WITH MINERALS) tablet Take 1 tablet by mouth daily.      Historical Provider, MD  nitrofurantoin, macrocrystal-monohydrate, (MACROBID) 100 MG capsule Take 1 capsule (100 mg  total) by mouth 2 (two) times daily. X 7days 09/28/13   Lahoma Rocker, PA  Norethindrone Acetate-Ethinyl Estradiol (JUNEL 1.5/30) 1.5-30 MG-MCG tablet TAKE 1 TABLET BY MOUTH DAILY. 07/23/13   Debbrah Alar, NP   BP 134/75  Pulse 81  Temp(Src) 98.2 F (36.8 C) (Oral)  Resp 16  SpO2 100% Physical Exam  Nursing note and vitals reviewed. Constitutional: She is oriented to person, place, and time. She appears well-developed and well-nourished. No distress.  HENT:  Head: Normocephalic and atraumatic.  Mouth/Throat: Oropharynx is clear and moist.  Eyes: Conjunctivae are normal. No scleral icterus.  Cardiovascular: Normal rate.   Pulmonary/Chest: Effort normal.  Abdominal: Soft. Bowel sounds are normal. She exhibits no distension. There is no tenderness.  Genitourinary: Pelvic exam was performed with patient supine. There is rash, tenderness and lesion on the right labia. There is rash, tenderness and lesion on the left labia. There is erythema and tenderness around the vagina. No bleeding around the vagina. No foreign body around the vagina. No signs of injury around the vagina. Vaginal discharge found.  Multiple yellow painful shallow ulcers covering introitus, labia and clitoris.  Patient was not able to tolerate bimanual or speculum exam secondary to pain.   Musculoskeletal: Normal range of motion.  Neurological: She is alert and oriented to person, place, and time.  Skin: Skin is warm and dry.  Psychiatric: She has a normal mood and  affect. Her behavior is normal.    ED Course  Procedures (including critical care time) Labs Review Labs Reviewed  HERPES SIMPLEX VIRUS CULTURE  URINE CULTURE  RPR  HSV 1 ANTIBODY, IGG  HSV 2 ANTIBODY, IGG  HIV ANTIBODY (ROUTINE TESTING)  CERVICOVAGINAL ANCILLARY ONLY    Imaging Review No results found.   MDM   1. Vaginitis   2. UTI (lower urinary tract infection)    Patient tested for HSV1, HSV2, RPR, HIV, gonorrhea, trichomonas,  chlamydia, yeast. 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 She declined empiric for gonorrhea, chlamydia and does not wish to have prescription for antiviral medication such as Valtrex or Acyclovir. She states she would only like medication for pain and will only accept treatment for any positive lab results. I encouraged her to follow up with her Gyn provider.  UPT negative UA consistent with UTI. Will offer treatment with Macrobid.     [place], [place] 09/28/13 [place], [place] 09/28/13 1053

## 2013-09-28 NOTE — Telephone Encounter (Signed)
Left detailed message on pt's cell# re: testing completed in May for: pap smear, GC/chlamydia and HPV and all of these were negative. Did not test for herpes at that visit and to call if further questions.

## 2013-09-28 NOTE — Discharge Instructions (Signed)
You have been tested for gonorrhea, chlamydia, trichomonas, HIV, herpes simplex 1 & 2, and syphilis. If any of your lab results indicate the need for treatment, you will be notified by phone. Do not drink alcohol, operate machinery, or drive a car if taking Vicodin for pain. Your pregnancy test was negative. You urine tests also show a urinary tract infection and you have been prescribed Macrobid for treatment. Please take as directed.  Vaginitis Vaginitis is an inflammation of the vagina. It is most often caused by a change in the normal balance of the bacteria and yeast that live in the vagina. This change in balance causes an overgrowth of certain bacteria or yeast, which causes the inflammation. There are different types of vaginitis, but the most common types are:  Bacterial vaginosis.  Yeast infection (candidiasis).  Trichomoniasis vaginitis. This is a sexually transmitted infection (STI).  Viral vaginitis.  Atropic vaginitis.  Allergic vaginitis. CAUSES  The cause depends on the type of vaginitis. Vaginitis can be caused by:  Bacteria (bacterial vaginosis).  Yeast (yeast infection).  A parasite (trichomoniasis vaginitis)  A virus (viral vaginitis).  Low hormone levels (atrophic vaginitis). Low hormone levels can occur during pregnancy, breastfeeding, or after menopause.  Irritants, such as bubble baths, scented tampons, and feminine sprays (allergic vaginitis). Other factors can change the normal balance of the yeast and bacteria that live in the vagina. These include:  Antibiotic medicines.  Poor hygiene.  Diaphragms, vaginal sponges, spermicides, birth control pills, and intrauterine devices (IUD).  Sexual intercourse.  Infection.  Uncontrolled diabetes.  A weakened immune system. SYMPTOMS  Symptoms can vary depending on the cause of the vaginitis. Common symptoms include:  Abnormal vaginal discharge.  The discharge is white, gray, or yellow with bacterial  vaginosis.  The discharge is thick, white, and cheesy with a yeast infection.  The discharge is frothy and yellow or greenish with trichomoniasis.  A bad vaginal odor.  The odor is fishy with bacterial vaginosis.  Vaginal itching, pain, or swelling.  Painful intercourse.  Pain or burning when urinating. Sometimes, there are no symptoms. TREATMENT  Treatment will vary depending on the type of infection.   Bacterial vaginosis and trichomoniasis are often treated with antibiotic creams or pills.  Yeast infections are often treated with antifungal medicines, such as vaginal creams or suppositories.  Viral vaginitis has no cure, but symptoms can be treated with medicines that relieve discomfort. Your sexual partner should be treated as well.  Atrophic vaginitis may be treated with an estrogen cream, pill, suppository, or vaginal ring. If vaginal dryness occurs, lubricants and moisturizing creams may help. You may be told to avoid scented soaps, sprays, or douches.  Allergic vaginitis treatment involves quitting the use of the product that is causing the problem. Vaginal creams can be used to treat the symptoms. HOME CARE INSTRUCTIONS   Take all medicines as directed by your caregiver.  Keep your genital area clean and dry. Avoid soap and only rinse the area with water.  Avoid douching. It can remove the healthy bacteria in the vagina.  Do not use tampons or have sexual intercourse until your vaginitis has been treated. Use sanitary pads while you have vaginitis.  Wipe from front to back. This avoids the spread of bacteria from the rectum to the vagina.  Let air reach your genital area.  Wear cotton underwear to decrease moisture buildup.  Avoid wearing underwear while you sleep until your vaginitis is gone.  Avoid tight pants and underwear or  nylons without a cotton panel.  Take off wet clothing (especially bathing suits) as soon as possible.  Use mild, non-scented  products. Avoid using irritants, such as:  Scented feminine sprays.  Fabric softeners.  Scented detergents.  Scented tampons.  Scented soaps or bubble baths.  Practice safe sex and use condoms. Condoms may prevent the spread of trichomoniasis and viral vaginitis. SEEK MEDICAL CARE IF:   You have abdominal pain.  You have a fever or persistent symptoms for more than 2-3 days.  You have a fever and your symptoms suddenly get worse. Document Released: 12/30/2006 Document Revised: 11/27/2011 Document Reviewed: 08/15/2011 [facility] Patient Information 2015 [place], [place]. This information is not intended to replace advice given to you by your health care provider. Make sure you discuss any questions you have with your health care provider.  Urinary Tract Infection Urinary tract infections (UTIs) can develop anywhere along your urinary tract. Your urinary tract is your body's drainage system for removing wastes and extra water. Your urinary tract includes two kidneys, two ureters, a bladder, and a urethra. Your kidneys are a pair of bean-shaped organs. Each kidney is about the size of your fist. They are located below your ribs, one on each side of your spine. CAUSES Infections are caused by microbes, which are microscopic organisms, including fungi, viruses, and bacteria. These organisms are so small that they can only be seen through a microscope. Bacteria are the microbes that most commonly cause UTIs. SYMPTOMS  Symptoms of UTIs may vary by age and gender of the patient and by the location of the infection. Symptoms in young women typically include a frequent and intense urge to urinate and a painful, burning feeling in the bladder or urethra during urination. Older women and men are more likely to be tired, shaky, and weak and have muscle aches and abdominal pain. A fever may mean the infection is in your kidneys. Other symptoms of a kidney infection include pain in your back or sides below  the ribs, nausea, and vomiting. DIAGNOSIS To diagnose a UTI, your caregiver will ask you about your symptoms. Your caregiver also will ask to provide a urine sample. The urine sample will be tested for bacteria and white blood cells. White blood cells are made by your body to help fight infection. TREATMENT  Typically, UTIs can be treated with medication. Because most UTIs are caused by a bacterial infection, they usually can be treated with the use of antibiotics. The choice of antibiotic and length of treatment depend on your symptoms and the type of bacteria causing your infection. HOME CARE INSTRUCTIONS  If you were prescribed antibiotics, take them exactly as your caregiver instructs you. Finish the medication even if you feel better after you have only taken some of the medication.  Drink enough water and fluids to keep your urine clear or pale yellow.  Avoid caffeine, tea, and carbonated beverages. They tend to irritate your bladder.  Empty your bladder often. Avoid holding urine for long periods of time.  Empty your bladder before and after sexual intercourse.  After a bowel movement, women should cleanse from front to back. Use each tissue only once. SEEK MEDICAL CARE IF:   You have back pain.  You develop a fever.  Your symptoms do not begin to resolve within 3 days. SEEK IMMEDIATE MEDICAL CARE IF:   You have severe back pain or lower abdominal pain.  You develop chills.  You have nausea or vomiting.  You have continued burning  or discomfort with urination. MAKE SURE YOU:   Understand these instructions.  Will watch your condition.  Will get help right away if you are not doing well or get worse. Document Released: 12/12/2004 Document Revised: 09/03/2011 Document Reviewed: 04/12/2011 [facility] Patient Information 2015 [place], [place]. This information is not intended to replace advice given to you by your health care provider. Make sure you discuss any questions you  have with your health care provider.

## 2013-09-28 NOTE — Telephone Encounter (Signed)
Patient called in stating that she is at the hospital and the physician there is telling her that she may have herpes. Patient states that she just had a pap and was told all results were negative. Patient is wanting to know what herpes test was? Patient is very upset, crying and would like to be called back as soon as possible.

## 2013-09-29 LAB — HSV 1 ANTIBODY, IGG: HSV 1 Glycoprotein G Ab, IgG: <0.1 IV

## 2013-09-29 LAB — HSV 2 ANTIBODY, IGG

## 2013-09-29 NOTE — ED Provider Notes (Signed)
Medical screening examination/treatment/procedure(s) were performed by resident physician or non-physician practitioner and as supervising physician I was immediately available for consultation/collaboration.   Pauline Good MD.   Billy Fischer, MD 09/29/13 [phone]

## 2013-09-30 ENCOUNTER — Emergency Department (HOSPITAL_COMMUNITY)
Admission: EM | Admit: 2013-09-30 | Discharge: 2013-10-01 | Disposition: A | Discharge status: Home / Self Care | Payer: Managed Care, Other (non HMO) | Attending: Emergency Medicine | Admitting: Emergency Medicine

## 2013-09-30 ENCOUNTER — Telehealth: Payer: Self-pay | Admitting: Family

## 2013-09-30 ENCOUNTER — Encounter (HOSPITAL_COMMUNITY): Payer: Self-pay | Admitting: Emergency Medicine

## 2013-09-30 DIAGNOSIS — N390 Urinary tract infection, site not specified: Secondary | ICD-10-CM | POA: Insufficient documentation

## 2013-09-30 DIAGNOSIS — N766 Ulceration of vulva: Secondary | ICD-10-CM

## 2013-09-30 DIAGNOSIS — Z8719 Personal history of other diseases of the digestive system: Secondary | ICD-10-CM | POA: Insufficient documentation

## 2013-09-30 DIAGNOSIS — Z79899 Other long term (current) drug therapy: Secondary | ICD-10-CM | POA: Insufficient documentation

## 2013-09-30 DIAGNOSIS — J45909 Unspecified asthma, uncomplicated: Secondary | ICD-10-CM | POA: Insufficient documentation

## 2013-09-30 DIAGNOSIS — N9489 Other specified conditions associated with female genital organs and menstrual cycle: Secondary | ICD-10-CM | POA: Insufficient documentation

## 2013-09-30 DIAGNOSIS — Z880 Allergy status to penicillin: Secondary | ICD-10-CM | POA: Insufficient documentation

## 2013-09-30 DIAGNOSIS — Z3202 Encounter for pregnancy test, result negative: Secondary | ICD-10-CM | POA: Insufficient documentation

## 2013-09-30 LAB — URINE CULTURE: Colony Count: 45000

## 2013-09-30 LAB — CBC
HCT: 42.7 % (ref 36.0–46.0)
Hemoglobin: 14.4 g/dL (ref 12.0–15.0)
MCH: 28.2 pg (ref 26.0–34.0)
MCHC: 33.7 g/dL (ref 30.0–36.0)
MCV: 83.7 fL (ref 78.0–100.0)
Platelets: 191 10*3/uL (ref 150–400)
RBC: 5.1 MIL/uL (ref 3.87–5.11)
RDW: 13.4 % (ref 11.5–15.5)
WBC: 8.6 10*3/uL (ref 4.0–10.5)

## 2013-09-30 LAB — WET PREP, GENITAL
Clue Cells Wet Prep HPF POC: NONE SEEN
Trich, Wet Prep: NONE SEEN
YEAST WET PREP: NONE SEEN

## 2013-09-30 LAB — BASIC METABOLIC PANEL
Anion gap: 20 — ABNORMAL HIGH (ref 5–15)
BUN: 9 mg/dL (ref 6–23)
CHLORIDE: 100 meq/L (ref 96–112)
CO2: 18 mEq/L — ABNORMAL LOW (ref 19–32)
Calcium: 9.3 mg/dL (ref 8.4–10.5)
Creatinine, Ser: 0.73 mg/dL (ref 0.50–1.10)
GFR calc Af Amer: >90 mL/min (ref >90)
GLUCOSE: 76 mg/dL (ref 70–99)
POTASSIUM: 3.8 meq/L (ref 3.7–5.3)
SODIUM: 138 meq/L (ref 137–147)

## 2013-09-30 LAB — HERPES SIMPLEX VIRUS CULTURE: CULTURE: DETECTED

## 2013-09-30 LAB — URINALYSIS, ROUTINE W REFLEX MICROSCOPIC
Glucose, UA: NEGATIVE mg/dL
Ketones, ur: >80 mg/dL — AB
Nitrite: NEGATIVE
PROTEIN: 100 mg/dL — AB
SPECIFIC GRAVITY, URINE: 1.03 (ref 1.005–1.030)
UROBILINOGEN UA: 1 mg/dL (ref 0.0–1.0)
pH: 6 (ref 5.0–8.0)

## 2013-09-30 LAB — POC URINE PREG, ED: PREG TEST UR: NEGATIVE

## 2013-09-30 LAB — URINE MICROSCOPIC-ADD ON

## 2013-09-30 LAB — LIPASE, BLOOD: LIPASE: 15 U/L (ref 11–59)

## 2013-09-30 MED ORDER — SODIUM CHLORIDE 0.9 % IV BOLUS (SEPSIS)
1000.0000 mL | Freq: Once | INTRAVENOUS | Status: AC
  Administered 2013-09-30: 1000 mL via INTRAVENOUS

## 2013-09-30 MED ORDER — MORPHINE SULFATE 2 MG/ML IJ SOLN
2.0000 mg | Freq: Once | INTRAMUSCULAR | Status: AC
  Administered 2013-09-30: 2 mg via INTRAVENOUS
  Filled 2013-09-30: qty 1

## 2013-09-30 MED ORDER — ONDANSETRON HCL 4 MG/2ML IJ SOLN
4.0000 mg | Freq: Once | INTRAMUSCULAR | Status: AC
  Administered 2013-09-30: 4 mg via INTRAVENOUS
  Filled 2013-09-30: qty 2

## 2013-09-30 MED ORDER — HYDROMORPHONE HCL PF 1 MG/ML IJ SOLN: 0.5000 mg | Freq: Once | INTRAMUSCULAR | Status: DC

## 2013-09-30 NOTE — ED Notes (Addendum)
Pt states that she was seen at an [facility] on Tuesday and they dx her w/ BV and UTI.  States that she was prescribed amoxicillin but has been vomiting it up x 2 days.  States that she wants an IV.

## 2013-09-30 NOTE — Telephone Encounter (Signed)
FYI

## 2013-09-30 NOTE — ED Provider Notes (Signed)
CSN: [id number]     Arrival date & time 09/30/13  1740 History   First MD Initiated Contact with Patient 09/30/13 1904     Chief Complaint  Patient presents with  . BV   . Emesis     (Consider location/radiation/quality/duration/timing/severity/associated sxs/prior Treatment) The history is provided by the patient. No language interpreter was used.  Holly Thornton is a 23 y/o F with PMHx of colitis and asthma presenting to the ED with vaginal discomfort. Patient reported that starting on Sunday she started vaginal pain, swelling, erythema, and rash to the vaginal region. Reported that she has been having a burning sensation with urination. Patient reported that she has been having lower back pain described as a throbbing pain without radiation. Patient reported that the swelling and erythema has decreased since Sunday. Stated that on Tuesday she was seen at Urgent Care center where she was discharged home with [facility] for UTI. Patient reported that she was upset regarding the interaction with the provider. Stated that she started Macrobid a couple of days ago and started to have nausea and vomiting - stated that she is unable to keep any foods or fluids down. Patient reported that her LMP started now - patient currently on Junel - stated that she has not been taking her BCP secondary to nausea and emesis. Reported mild vaginal discharge. Denied fever, chest pain, shortness of breath, difficulty breathing, numbness, tingling, weakness, abdominal pain.  PCP Dr. Inda Castle  Past Medical History  Diagnosis Date  . Colitis   . Asthma     child   Past Surgical History  Procedure Laterality Date  . No past surgeries     Family History  Problem Relation Age of Onset  . Depression Mother   . Fibroids Mother   . Anemia Mother   . Diabetes Maternal Grandmother    History  Substance Use Topics  . Smoking status: Never Smoker   . Smokeless tobacco: Never Used  . Alcohol Use: 0.5 oz/week      1 drink(s) per week     Comment: occasionally   OB History   Grav Para Term Preterm Abortions TAB SAB Ect Mult Living                 Review of Systems  Constitutional: Positive for chills. Negative for fever.  Respiratory: Negative for chest tightness and shortness of breath.   Cardiovascular: Negative for chest pain.  Gastrointestinal: Positive for nausea and vomiting. Negative for abdominal pain, diarrhea, constipation, blood in stool and anal bleeding.  Genitourinary: Positive for dysuria, vaginal discharge, vaginal pain and pelvic pain. Negative for hematuria, flank pain, decreased urine volume and vaginal bleeding.  Musculoskeletal: Positive for back pain. Negative for neck pain.      Allergies  Amoxicillin and Peanut-containing drug products  Home Medications   Prior to Admission medications   Medication Sig Start Date End Date Taking? Authorizing Provider  HYDROcodone-acetaminophen (NORCO/VICODIN) 5-325 MG per tablet Take 1-2 tablets by mouth every 6 (six) hours as needed for moderate pain or severe pain. 09/28/13  Yes Lahoma Rocker, PA  nitrofurantoin, macrocrystal-monohydrate, (MACROBID) 100 MG capsule Take 1 capsule (100 mg total) by mouth 2 (two) times daily. X 7days 09/28/13  Yes Annett Gula Irmo, PA  norethindrone-ethinyl estradiol (JUNEL FE,GILDESS FE,LOESTRIN FE) 1-20 MG-MCG tablet Take 1 tablet by mouth daily.   Yes Historical Provider, MD  Phenazopyridine HCl (AZO STANDARD MAXIMUM STRENGTH PO) Take 1 tablet by mouth 3 (three) times daily  as needed (for urinary symstoms).   Yes Historical Provider, MD  acyclovir (ZOVIRAX) 400 MG tablet Take 1 tablet (400 mg total) by mouth 3 (three) times daily. 10/01/13   Harvy Riera, PA-C  ciprofloxacin (CIPRO) 250 MG tablet Take 1 tablet (250 mg total) by mouth every 12 (twelve) hours. 10/01/13   Cleota Pellerito, PA-C   BP 139/75  Pulse 69  Temp(Src) 98.9 F (37.2 C) (Oral)  Resp 18  SpO2 100% Physical Exam   Nursing note and vitals reviewed. Constitutional: She is oriented to person, place, and time. She appears well-developed and well-nourished. No distress.  HENT:  Head: Normocephalic and atraumatic.  Mouth/Throat: Oropharynx is clear and moist. No oropharyngeal exudate.  Eyes: Conjunctivae and EOM are normal. Pupils are equal, round, and reactive to light. Right eye exhibits no discharge. Left eye exhibits no discharge.  Neck: Normal range of motion. Neck supple. No tracheal deviation present.  Negative neck stiffness Negative nuchal rigidity  Negative cervical lymphadenopathy  Negative meningeal signs  Cardiovascular: Normal rate, regular rhythm and normal heart sounds.  Exam reveals no friction rub.   No murmur heard. Pulses:      Radial pulses are 2+ on the right side, and 2+ on the left side.       Dorsalis pedis pulses are 2+ on the right side, and 2+ on the left side.       Posterior tibial pulses are 2+ on the right side, and 2+ on the left side.  Cap refill < 3 seconds Negative swelling or pitting edema noted to the lower extremities bilaterally   Pulmonary/Chest: Effort normal and breath sounds normal. No respiratory distress. She has no wheezes. She has no rales.  Abdominal: Soft. Bowel sounds are normal. She exhibits no distension. There is tenderness in the suprapubic area. There is no rebound and no guarding.    Negative abdominal distension  BS normoactive in all 4 quadrants Abdomen soft upon palpation  Negative peritoneal signs  Negative rigidity or guarding noted upon exam  Discomfort upon palpation to the suprapubic region Negative CVA tenderness noted bilaterally    Genitourinary:  Pelvic Exam: Swelling and ulcers noted to the labia majora and minora. Tenderness upon palpation. Negative active drainage noted. Erythema noted surrounding the ulcerations. Ulcerations noted to the vaginal canal with erythematous base. Patient unable to tolerate complete exam secondary  to pain.  Exam chaperoned with tech.   Musculoskeletal: Normal range of motion.  Full ROM to upper and lower extremities without difficulty noted, negative ataxia noted.  Lymphadenopathy:    She has no cervical adenopathy.  Neurological: She is alert and oriented to person, place, and time. No cranial nerve deficit. She exhibits normal muscle tone. Coordination normal.  Cranial nerves III-XII grossly intact Strength 5+/5+ to upper and lower extremities bilaterally with resistance applied, equal distribution noted Equal grip strength bilaterally  Negative facial drooping  Negative slurred speech  Negative aphasia GCS 15 Patient follows commands well without complications   Skin: Skin is warm and dry. No rash noted. She is not diaphoretic. No erythema.  Psychiatric: She has a normal mood and affect. Her behavior is normal. Thought content normal.    ED Course  Procedures (including critical care time)  Results for orders placed during the hospital encounter of 09/30/13  WET PREP, GENITAL      Result Value Ref Range   Yeast Wet Prep HPF POC NONE SEEN  NONE SEEN   Trich, Wet Prep NONE SEEN  NONE SEEN  Clue Cells Wet Prep HPF POC NONE SEEN  NONE SEEN   WBC, Wet Prep HPF POC FEW (*) NONE SEEN  URINALYSIS, ROUTINE W REFLEX MICROSCOPIC      Result Value Ref Range   Color, Urine AMBER (*) YELLOW   APPearance TURBID (*) CLEAR   Specific Gravity, Urine 1.030  1.005 - 1.030   pH 6.0  5.0 - 8.0   Glucose, UA NEGATIVE  NEGATIVE mg/dL   Hgb urine dipstick LARGE (*) NEGATIVE   Bilirubin Urine SMALL (*) NEGATIVE   Ketones, ur >80 (*) NEGATIVE mg/dL   Protein, ur 100 (*) NEGATIVE mg/dL   Urobilinogen, UA 1.0  0.0 - 1.0 mg/dL   Nitrite NEGATIVE  NEGATIVE   Leukocytes, UA MODERATE (*) NEGATIVE  CBC      Result Value Ref Range   WBC 8.6  4.0 - 10.5 K/uL   RBC 5.10  3.87 - 5.11 MIL/uL   Hemoglobin 14.4  12.0 - 15.0 g/dL   HCT 42.7  36.0 - 46.0 %   MCV 83.7  78.0 - 100.0 fL   MCH 28.2   26.0 - 34.0 pg   MCHC 33.7  30.0 - 36.0 g/dL   RDW 13.4  11.5 - 15.5 %   Platelets 191  150 - 400 K/uL  BASIC METABOLIC PANEL      Result Value Ref Range   Sodium 138  137 - 147 mEq/L   Potassium 3.8  3.7 - 5.3 mEq/L   Chloride 100  96 - 112 mEq/L   CO2 18 (*) 19 - 32 mEq/L   Glucose, Bld 76  70 - 99 mg/dL   BUN 9  6 - 23 mg/dL   Creatinine, Ser 0.73  0.50 - 1.10 mg/dL   Calcium 9.3  8.4 - 10.5 mg/dL   GFR calc non Af Amer >90  >90 mL/min   GFR calc Af Amer >90  >90 mL/min   Anion gap 20 (*) 5 - 15  LIPASE, BLOOD      Result Value Ref Range   Lipase 15  11 - 59 U/L  URINE MICROSCOPIC-ADD ON      Result Value Ref Range   Squamous Epithelial / LPF RARE  RARE   WBC, UA 21-50  <3 WBC/hpf   RBC / HPF 21-50  <3 RBC/hpf   Bacteria, UA FEW (*) RARE   Urine-Other MUCOUS PRESENT    POC URINE PREG, ED      Result Value Ref Range   Preg Test, Ur NEGATIVE  NEGATIVE   Labs Review Labs Reviewed  WET PREP, GENITAL - Abnormal; Notable for the following:    WBC, Wet Prep HPF POC FEW (*)    All other components within normal limits  URINALYSIS, ROUTINE W REFLEX MICROSCOPIC - Abnormal; Notable for the following:    Color, Urine AMBER (*)    APPearance TURBID (*)    Hgb urine dipstick LARGE (*)    Bilirubin Urine SMALL (*)    Ketones, ur >80 (*)    Protein, ur 100 (*)    Leukocytes, UA MODERATE (*)    All other components within normal limits  BASIC METABOLIC PANEL - Abnormal; Notable for the following:    CO2 18 (*)    Anion gap 20 (*)    All other components within normal limits  URINE MICROSCOPIC-ADD ON - Abnormal; Notable for the following:    Bacteria, UA FEW (*)    All other components within normal limits  GC/CHLAMYDIA PROBE AMP  URINE CULTURE  CBC  LIPASE, BLOOD  POC URINE PREG, ED    Imaging Review No results found.   EKG Interpretation None      MDM   Final diagnoses:  Urinary tract infection without hematuria, site unspecified  Genital ulcer, female     Medications  morphine 2 MG/ML injection 2 mg (2 mg Intravenous Given 09/30/13 2014)  ondansetron (ZOFRAN) injection 4 mg (4 mg Intravenous Given 09/30/13 2012)  sodium chloride 0.9 % bolus 1,000 mL (0 mLs Intravenous Stopped 09/30/13 2125)  morphine 2 MG/ML injection 2 mg (2 mg Intravenous Given 09/30/13 2124)   Filed Vitals:   09/30/13 1750 09/30/13 2232  BP: 134/87 139/75  Pulse: 111 69  Temp: 98.9 F (37.2 C)   TempSrc: Oral   Resp: 18 18  SpO2: 100% 100%   This provider reviewed patient's chart. Patient was seen and assessed at urgent care Center on Tuesday, 09/28/2013 regarding vaginal discharge as well as vaginal ulcers. HIV, HSV 1 and 2, RPR, wet prep was performed. Negative findings for HIV, HSV 1 and 2. Patient was recommended to get empiric therapy for gonorrhea and chlamydia-patient declined at this time. Patient was discharged home with Macrobid regarding UTI. 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  CBC negative elevated with count - negative leukocytosis. BMP negative findings-BUN 9, creatinine 0.73. Lipase negative elevation. Urine pregnancy negative. Urinalysis noted large hemoglobin-patient currently menstruating. Moderate leukocytes identified with white blood cell count of 21-50. Urine culture pending. Wet prep noted-negative yeast, trichomoniasis, clue cells-very few white blood cells. GC Chlamydia probe pending. Doubt acute abdominal processes. Suspicion to be UTI and possible Herpes type I outbreak, based on culture from the vaginal specimen. Patient became very upset regarding diagnosis of possible herpes - informed patient of Herpes type I being detected on vaginal sample - patient became very upset. This provider had a long discussion with patient regarding herpes and STDs - educated patient. Highly recommended patient to follow up as an outpatient with PCP and for partner to be tested. Patient stable, afebrile. Patient not septic appearing. Discharged  patient. Discussed with patient to rest and stay hydrated. Discussed with patient to avoid any physical or strenuous activity. Discharged patient with antibiotics for UTI and Acyclovir for Herpes. Did not recommend patient to take Macrobid anymore since it caused nausea and vomiting. Referred to [facility] and [facility]CP. Discussed with patient to closely monitor symptoms and if symptoms are to worsen or change to report back to the ED - strict return instructions given.  Patient agreed to plan of care, understood, all questions answered.   Jamse Mead, PA-C 10/02/13 1435

## 2013-09-30 NOTE — Telephone Encounter (Signed)
Patient Information:  Caller Name: Ubaldo Glassing  Phone: [phone]  Patient: Holly Thornton  Gender: Female  DOB: [birth year]  Age: 23 Years  PCP: Debbrah Alar (Adults only)  Pregnant: No  Office Follow Up:  Does the office need to follow up with this patient?: No  Instructions For The Office: N/A  RN Note:  Mother calling to notify MD that she is taking the patient to the ER at this time. Patient was diagnosed with UTI on Monday 09/27/13. She has not been able to keep medicine or food down for the last three days. Mother feels she needs further treatment.  Symptoms  Reason For Call & Symptoms: Calling to inform MD that the patient is being taken to the ER at this time.  Reviewed Health History In EMR: No  Reviewed Medications In EMR: No  Reviewed Allergies In EMR: No  Reviewed Surgeries / Procedures: No  Date of Onset of Symptoms: 09/27/2013 OB / GYN:  LMP: Unknown  Guideline(s) Used:  No Protocol Available - Sick Adult  Disposition Per Guideline:   Go to ED Now  Reason For Disposition Reached:   Nursing judgment  Advice Given:  N/A  Patient Will Follow Care Advice:  YES

## 2013-10-01 LAB — URINE CULTURE
Colony Count: >=100000
Special Requests: NORMAL

## 2013-10-01 MED ORDER — ACYCLOVIR 400 MG PO TABS: 400.0000 mg | ORAL_TABLET | Freq: Three times a day (TID) | ORAL | Status: DC

## 2013-10-01 MED ORDER — CIPROFLOXACIN HCL 250 MG PO TABS: 250.0000 mg | ORAL_TABLET | Freq: Two times a day (BID) | ORAL | Status: DC

## 2013-10-01 NOTE — Discharge Instructions (Signed)
Please call your doctor for a followup appointment within 24-48 hours. When you talk to your doctor please let them know that you were seen in the emergency department and have them acquire all of your records so that they can discuss the findings with you and formulate a treatment plan to fully care for your new and ongoing problems. Please call and set-up an appointment with your primary care provider Please take medications as prescribed - please take on a full stomach  Please continue to monitor symptoms closely and if symptoms are to worsen or change (fever greater than 101, chills, sweating, nausea, vomiting, chest pain, shortness of breath, difficulty breathing, numbness, tingling, weakness, swelling, drainage, weakness, bleeding, red streaks running down legs) please report back to the ED immediately   Urinary Tract Infection Urinary tract infections (UTIs) can develop anywhere along your urinary tract. Your urinary tract is your body's drainage system for removing wastes and extra water. Your urinary tract includes two kidneys, two ureters, a bladder, and a urethra. Your kidneys are a pair of bean-shaped organs. Each kidney is about the size of your fist. They are located below your ribs, one on each side of your spine. CAUSES Infections are caused by microbes, which are microscopic organisms, including fungi, viruses, and bacteria. These organisms are so small that they can only be seen through a microscope. Bacteria are the microbes that most commonly cause UTIs. SYMPTOMS  Symptoms of UTIs may vary by age and gender of the patient and by the location of the infection. Symptoms in young women typically include a frequent and intense urge to urinate and a painful, burning feeling in the bladder or urethra during urination. Older women and men are more likely to be tired, shaky, and weak and have muscle aches and abdominal pain. A fever may mean the infection is in your kidneys. Other symptoms of a  kidney infection include pain in your back or sides below the ribs, nausea, and vomiting. DIAGNOSIS To diagnose a UTI, your caregiver will ask you about your symptoms. Your caregiver also will ask to provide a urine sample. The urine sample will be tested for bacteria and white blood cells. White blood cells are made by your body to help fight infection. TREATMENT  Typically, UTIs can be treated with medication. Because most UTIs are caused by a bacterial infection, they usually can be treated with the use of antibiotics. The choice of antibiotic and length of treatment depend on your symptoms and the type of bacteria causing your infection. HOME CARE INSTRUCTIONS  If you were prescribed antibiotics, take them exactly as your caregiver instructs you. Finish the medication even if you feel better after you have only taken some of the medication.  Drink enough water and fluids to keep your urine clear or pale yellow.  Avoid caffeine, tea, and carbonated beverages. They tend to irritate your bladder.  Empty your bladder often. Avoid holding urine for long periods of time.  Empty your bladder before and after sexual intercourse.  After a bowel movement, women should cleanse from front to back. Use each tissue only once. SEEK MEDICAL CARE IF:   You have back pain.  You develop a fever.  Your symptoms do not begin to resolve within 3 days. SEEK IMMEDIATE MEDICAL CARE IF:   You have severe back pain or lower abdominal pain.  You develop chills.  You have nausea or vomiting.  You have continued burning or discomfort with urination. MAKE SURE YOU:  Understand these instructions.  Will watch your condition.  Will get help right away if you are not doing well or get worse. Document Released: 12/12/2004 Document Revised: 09/03/2011 Document Reviewed: 04/12/2011 [facility] Patient Information 2015 [place], [place]. This information is not intended to replace advice given to you by your  health care provider. Make sure you discuss any questions you have with your health care provider.   Emergency Department Resource Guide 1) Find a Doctor and Pay Out of Pocket Although you won't have to find out who is covered by your insurance plan, it is a good idea to ask around and get recommendations. You will then need to call the office and see if the doctor you have chosen will accept you as a new patient and what types of options they offer for patients who are self-pay. Some doctors offer discounts or will set up payment plans for their patients who do not have insurance, but you will need to ask so you aren't surprised when you get to your appointment.  2) Contact Your Local Health Department Not all health departments have doctors that can see patients for sick visits, but many do, so it is worth a call to see if yours does. If you don't know where your local health department is, you can check in your phone book. The CDC also has a tool to help you locate your state's health department, and many state websites also have listings of all of their local health departments.  3) Find a [facility] If your illness is not likely to be very severe or complicated, you may want to try a walk in clinic. These are popping up all over the country in pharmacies, drugstores, and shopping centers. They're usually staffed by nurse practitioners or physician assistants that have been trained to treat common illnesses and complaints. They're usually fairly quick and inexpensive. However, if you have serious medical issues or chronic medical problems, these are probably not your best option.  No Primary Care Doctor: - Call Health Connect at  [phone] - they can help you locate a primary care doctor that  accepts your insurance, provides certain services, etc. - Physician Referral Service- [phone]  Chronic Pain Problems: Organization         Address  Phone   Notes  [facility]  [phone] Patients need to be referred by their primary care doctor.   Medication Assistance: Organization         Address  Phone   Notes  [facility] Medication [facility] [address] [place], [place] [address] [phone] --Must be a resident of [facility] -- Must have NO insurance coverage whatsoever (no Medicaid/ Medicare, etc.) -- The pt. MUST have a primary care doctor that directs their care regularly and follows them in the community   Med[facility]ist  [phone]   [employer]  [phone]    Agencies that provide inexpensive medical care: Organization         Address  Phone   Notes  [place]  [phone]   [facility]    [phone]   [facility] [place], [place] [address] [phone]   [place] [phone]   Planned Parenthood    ([phone]   [facility]    [phone]   [facility]  [place] [address], [place] Phone:  [phone], Fax:  [phone] Hours of Operation:  9 am - 6 pm, M-F.  Also accepts Medicaid/Medicare and self-pay.  [facility] for National [place], [address], [place] Phone: [phone], Fax: [phone]. Hours of Operation:  8:30 am - 5:30 pm, M-F.  Also accepts Medicaid and self-pay.  [facility] [place] [address], [place] Phone: [phone]   [place], [place], [place] [phone], Ext. [phone] Mondays & Thursdays: 7-9 AM.  First 15 patients are seen on a first come, first serve basis.    [place] Providers:  Organization         Address  Phone   Notes  [facility] [address], [place] [phone] Also accepts self-pay patients.  [facility] [address], [place]  [phone]   [place], [address], [place] [phone]   [facility] [address] [place] [phone]   Lucianne Lei [address], [place]   [phone] Only accepts [place] Access [place] patients after they have their name applied to their card.   Self-Pay (no insurance) in [facility]:  Organization         Address  Phone   Notes  Sickle Cell Patients, [facility] [place] [phone]   [facility] Urgent Care [place] [phone]   [facility] [place]  [place], [place], [place] [phone]   [facility]/Dr. Osei-Bonsu  [address] [place] or [address], [place] [phone] Phone number for both [place] and [place] locations is the same.  [facility]gent [facility] [address] [place] [phone]   [facility] [address] [place] or [address] [phone]   [facility] [address] [place] [phone], phone; [phone], fax Sees patients 1st and 3rd Saturday of every month.  Must not qualify for public or private insurance (i.e. Medicaid, Medicare, [place] Health Choice, Veterans' Benefits)  Household income should be no more than 200% of the poverty level The clinic cannot treat you if you are pregnant or think you are pregnant  Sexually transmitted diseases are not treated at the clinic.    Dental Care: Organization         Address  Phone  Notes  [facility] Department [facility] [place] [phone] Accepts children up to age 84 who are enrolled in [place] or [place]; pregnant women with a Medicaid card; and children who have applied for Medicaid or [place] Health  Choice, but were declined, whose parents can pay a reduced fee at time of service.  [facility] Department of [facility]  [address], [place] [phone] Accepts children up to age 49 who are enrolled in [place] or [place]; pregnant women with a Medicaid card; and children who have applied for Medicaid or [place] Health Choice, but were declined, whose parents can pay a reduced fee at time of service.  [facility] Adult Dental Access PROGRAM  [place], [place] [phone]  Patients are seen by appointment only. Walk-ins are not accepted. [place] will see patients 66 years of age and older. Monday - Tuesday (8am-5pm) Most Wednesdays (8:30-5pm) $30 per visit, cash only  [facility] Adult Dental Access PROGRAM  [address], [facility] [phone] Patients are seen by appointment only. Walk-ins are not accepted. [place] will see patients 25 years of age and older. One Wednesday Evening (Monthly: Volunteer Based).  $30 per visit, cash only  [place]  [phone] for adults; Children under age 74, call Graduate Pediatric Dentistry at [phone]. Children aged 11-14, please call [phone] to request a pediatric application.  Dental services are provided in all areas of dental care including fillings, crowns and bridges, complete and partial dentures, implants, gum treatment, root canals, and extractions. Preventive care is also provided. Treatment is provided to both adults and children. Patients are selected via a lottery and there is often a waiting list.   [facility] [address] [place]  [phone] [email or web address]   [facility] [address] [place], [place] [phone], Ext. [phone] Second and Fourth Thursday of each month, opens at 6:30 AM; Clinic ends at 9 AM.  Patients are seen on a first-come first-served basis, and a limited number are seen during each  clinic.   [facility]  [address] [place], [place] [phone]   Eligibility Requirements You must have lived in [place], [place], or [place] counties for at least the last three months.   You cannot be eligible for state or federal sponsored [employer], including [employer], [place], or [employer].   You generally cannot be eligible for healthcare insurance through your employer.    How to apply: Eligibility screenings are held every Tuesday and Wednesday afternoon from 1:00 pm until 4:00 pm. You do not need an appointment for the interview!  [facility] [address], [place], [place]   [place]  [place] Department  [place]  [phone]    Behavioral Health Resources in the Community: Intensive Outpatient Programs Organization         Address  Phone  Notes  Sigurd [place]. [address], [place], [place] [phone]   [facility] Outpatient [address] [place], [place]   ADS: Alcohol & Drug Svcs [address] [place], [place]   [place] [address],  [place], [place] or [phone]   Substance Abuse Resources Organization         Address  Phone  Notes  Alcohol and Drug Services  [phone]   [place]  [phone]   [facility] [place]   Chinita Pester  [phone]   Residential & Outpatient Substance Abuse Program  [phone]   Psychological Services Organization         Address  Phone  Notes  [facility]  [place]  [phone]   [place] [address] [place] or [phone]    Mobile Crisis Teams Organization         Address  Phone  Notes  [facility]rapeutic Alternatives, Mobile  Crisis Care Unit  [phone]   [facility]  [address] [place], [place]   [facility] [address] [place]  [place] [phone]    Self-Help/Support Groups Organization         Address  Phone             Notes  [facility] of [place] - variety of support groups  [place] Call for more information  Narcotics Anonymous (NA), [facility] [address], [employer] [place]  2 meetings at this location   Special educational needs teacher         Address  Phone  Notes  [facility] Residential Treatment [place],    [place]  [phone]   [facility]  [address], [place] [address], [place], [place]   [place], [place] [phone] Admissions: 8am-3pm M-F  Incentives Substance [place] [address],    [place], [place] [phone]   [facility] [address] [place], [place], [place]   [facility] [address],  [place], [place]   [facility] Programs - Intensive Outpatient [address], [place], [place]   [facility].,  [place], [place] [phone] or [phone]   [facility] Services (RTS) [address], [place], [place] Accepts Medicaid  [facility] [place] [address],  [place] [phone] Substance Abuse/Addiction Treatment   [facility] Organization         Address  Phone  Notes  [facility]  [phone]   Domenic Schwab, PhD [address] Arlis Porta [place], [place]   [phone] or [phone]   [place] [address] [place], [place] [phone]   [facility] [address] [place], [place] [phone] Insurance/Medicaid/sponsorship through [facility] and Families [address], [address]  [place]                                    [place], [place] [phone] [place] [address], [place] [phone]    Dr. Adele Schilder  [phone]   [facility] Dept. 1) [address], [place] 2) [place] 3)  [place] [address], [place] [phone]  [phone]   [place] [phone] or [phone] (After Hours)

## 2013-10-02 LAB — GC/CHLAMYDIA PROBE AMP
CT Probe RNA: NEGATIVE
GC Probe RNA: NEGATIVE

## 2013-10-02 NOTE — ED Notes (Signed)
Herpes culture vagina: Herpes Simplex Type 1 detected., HIV/RPR non-reactive, HSV 1 neg., HSV 2 neg., Urine culture: Multiple bacterial types none predominant.  Zovirax prescribed by PA in ED on 7/17. Holly Thornton 10/02/2013

## 2013-10-04 NOTE — ED Provider Notes (Signed)
Medical screening examination/treatment/procedure(s) were performed by non-physician practitioner and as supervising physician I was immediately available for consultation/collaboration.   EKG Interpretation None        Osvaldo Shipper, MD 10/04/13 [phone]

## 2013-10-05 NOTE — ED Notes (Signed)
Pt. called back.  Verified x 2. Pt. had received the results while in the ED on 7/16 and was treated with Zovirax.  Pt. instructed to notify her partner, that she can pass the virus even when she doesn't have an outbreak, so always practice safe sex. Instructed to get treated for each outbreak with Acyclovir or Valtrex. Suggested that she get an OB-GYN doctor who can give her a 1 yr Rx. to fill when she starts an outbreak.  They can give suppressive therapy if she is having frequent outbreaks.  Pt. 's questions answered about medication, and HSV testing.  She asked about having children and told her to ask her OB doctor about that. Pt. voiced understanding.

## 2013-10-24 ENCOUNTER — Emergency Department (HOSPITAL_COMMUNITY)
Admission: EM | Admit: 2013-10-24 | Discharge: 2013-10-24 | Disposition: A | Discharge status: Home / Self Care | Payer: Managed Care, Other (non HMO) | Attending: Emergency Medicine | Admitting: Emergency Medicine

## 2013-10-24 ENCOUNTER — Encounter (HOSPITAL_COMMUNITY): Payer: Self-pay | Admitting: Emergency Medicine

## 2013-10-24 DIAGNOSIS — Z3202 Encounter for pregnancy test, result negative: Secondary | ICD-10-CM | POA: Insufficient documentation

## 2013-10-24 DIAGNOSIS — N949 Unspecified condition associated with female genital organs and menstrual cycle: Secondary | ICD-10-CM | POA: Diagnosis present

## 2013-10-24 DIAGNOSIS — A499 Bacterial infection, unspecified: Secondary | ICD-10-CM | POA: Diagnosis not present

## 2013-10-24 DIAGNOSIS — A6000 Herpesviral infection of urogenital system, unspecified: Secondary | ICD-10-CM | POA: Diagnosis not present

## 2013-10-24 DIAGNOSIS — J45909 Unspecified asthma, uncomplicated: Secondary | ICD-10-CM | POA: Insufficient documentation

## 2013-10-24 DIAGNOSIS — B9689 Other specified bacterial agents as the cause of diseases classified elsewhere: Secondary | ICD-10-CM | POA: Insufficient documentation

## 2013-10-24 DIAGNOSIS — B3731 Acute candidiasis of vulva and vagina: Secondary | ICD-10-CM | POA: Diagnosis not present

## 2013-10-24 DIAGNOSIS — B373 Candidiasis of vulva and vagina: Secondary | ICD-10-CM | POA: Diagnosis not present

## 2013-10-24 DIAGNOSIS — N760 Acute vaginitis: Secondary | ICD-10-CM | POA: Diagnosis not present

## 2013-10-24 HISTORY — DX: Herpesviral infection of other urogenital tract: A60.09

## 2013-10-24 LAB — POC URINE PREG, ED: Preg Test, Ur: NEGATIVE

## 2013-10-24 LAB — WET PREP, GENITAL: Trich, Wet Prep: NONE SEEN

## 2013-10-24 LAB — URINALYSIS, ROUTINE W REFLEX MICROSCOPIC
Bilirubin Urine: NEGATIVE
GLUCOSE, UA: NEGATIVE mg/dL
Ketones, ur: NEGATIVE mg/dL
Nitrite: NEGATIVE
PROTEIN: NEGATIVE mg/dL
SPECIFIC GRAVITY, URINE: 1.015 (ref 1.005–1.030)
Urobilinogen, UA: 1 mg/dL (ref 0.0–1.0)
pH: 6.5 (ref 5.0–8.0)

## 2013-10-24 LAB — URINE MICROSCOPIC-ADD ON

## 2013-10-24 MED ORDER — LIDOCAINE HCL 2 % EX GEL: 1.0000 "application " | Freq: Three times a day (TID) | CUTANEOUS | Status: DC

## 2013-10-24 MED ORDER — VALACYCLOVIR HCL 1 G PO TABS: 1000.0000 mg | ORAL_TABLET | Freq: Two times a day (BID) | ORAL | Status: AC

## 2013-10-24 MED ORDER — METRONIDAZOLE 500 MG PO TABS: 500.0000 mg | ORAL_TABLET | Freq: Two times a day (BID) | ORAL | Status: DC

## 2013-10-24 MED ORDER — FLUCONAZOLE 150 MG PO TABS: 150.0000 mg | ORAL_TABLET | Freq: Once | ORAL | Status: DC

## 2013-10-24 NOTE — ED Notes (Signed)
Pt escorted to discharge window. Pt verbalized understanding discharge instructions. In no acute distress.  

## 2013-10-24 NOTE — ED Provider Notes (Signed)
TIME SEEN: 1:45 PM  CHIEF COMPLAINT: Vaginal pain and lesions  HPI: Patient is a 23 year old G28 with history of Chlamydia in the past and has been treated in recent diagnosis of genital herpes who presents to the emergency department with complaints of an outbreak of genital herpes. She states she's noticed multiple lesions that are painful. She has a has had some itching. She states she thinks she may have a yeast infection as well.  She reports she has had intermittent vaginal bleeding, spotting since starting acyclovir. No dysuria or hematuria. No fevers, chills, nausea, vomiting or diarrhea. No abdominal pain. She is sectioned active with one partner and they do not use protection.  ROS: See HPI Constitutional: no fever  Eyes: no drainage  ENT: no runny nose   Cardiovascular:  no chest pain  Resp: no SOB  GI: no vomiting GU: no dysuria Integumentary: no rash  Allergy: no hives  Musculoskeletal: no leg swelling  Neurological: no slurred speech ROS otherwise negative  PAST MEDICAL HISTORY/PAST SURGICAL HISTORY:  Past Medical History  Diagnosis Date  . Colitis   . Asthma     child  . Herpes genitalis in women     MEDICATIONS:  Prior to Admission medications   Medication Sig Start Date End Date Taking? Authorizing Provider  ibuprofen (ADVIL,MOTRIN) 200 MG tablet Take 200,440 mg by mouth every 6 (six) hours as needed for moderate pain.   Yes Historical Provider, MD  norethindrone-ethinyl estradiol (JUNEL FE,GILDESS FE,LOESTRIN FE) 1-20 MG-MCG tablet Take 1 tablet by mouth daily.   Yes Historical Provider, MD    ALLERGIES:  Allergies  Allergen Reactions  . Amoxicillin Anaphylaxis and Swelling  . Peanut-Containing Drug Products Anaphylaxis, Hives, Diarrhea, Rash and Other (See Comments)    Puffiness of the eys     SOCIAL HISTORY:  History  Substance Use Topics  . Smoking status: Never Smoker   . Smokeless tobacco: Never Used  . Alcohol Use: 0.5 oz/week    1 drink(s) per  week     Comment: occasionally    FAMILY HISTORY: Family History  Problem Relation Age of Onset  . Depression Mother   . Fibroids Mother   . Anemia Mother   . Diabetes Maternal Grandmother     EXAM: BP 125/94  Pulse 67  Temp(Src) 98.9 F (37.2 C) (Oral)  Resp 16  SpO2 100% CONSTITUTIONAL: Alert and oriented and responds appropriately to questions. Well-appearing; well-nourished HEAD: Normocephalic EYES: Conjunctivae clear, PERRL ENT: normal nose; no rhinorrhea; moist mucous membranes; pharynx without lesions noted NECK: Supple, no meningismus, no LAD  CARD: RRR; S1 and S2 appreciated; no murmurs, no clicks, no rubs, no gallops RESP: Normal chest excursion without splinting or tachypnea; breath sounds clear and equal bilaterally; no wheezes, no rhonchi, no rales,  ABD/GI: Normal bowel sounds; non-distended; soft, non-tender, no rebound, no guarding GU:  Patient has multiple small less than 3 mm open lesions around the hood of her clitoris without drainage, she does have thick white vaginal discharge and a small amount of dark red blood coming from the cervical os, no adnexal tenderness or fullness, no cervical motion tenderness BACK:  The back appears normal and is non-tender to palpation, there is no CVA tenderness EXT: Normal ROM in all joints; non-tender to palpation; no edema; normal capillary refill; no cyanosis    SKIN: Normal color for age and race; warm NEURO: Moves all extremities equally PSYCH: The patient's mood and manner are appropriate. Grooming and personal hygiene are appropriate.  MEDICAL DECISION MAKING: Patient here with herpes outbreak. She also appears to have a yeast infection on exam. Pelvic cultures pending. Urinalysis also pending. She is otherwise hemodynamically stable with benign abdominal exam. Doubt torsion, ectopic, TOA, cyst.  ED PROGRESS: Urine pregnancy negative. Urine shows moderate hemoglobin which may be from her vaginal bleeding as well as  large leukocytes and many squamous cells and rare bacteria. She does have yeast and clue cells on her wet prep. We'll discharge with prescription for Diflucan and Flagyl, acyclovir. We'll also discharge with prescription for topical lidocaine for pain control she states oral pain medication makes her vomit. She has outpatient followup. Discussed return precautions and supportive care instructions. She verbalized understanding and is comfortable plan.     [place], DO 10/24/13 1457

## 2013-10-24 NOTE — ED Notes (Signed)
Pt alert and oriented x4. Respirations even and unlabored, bilateral symmetrical rise and fall of chest. Skin warm and dry. In no acute distress. Denies needs.   

## 2013-10-24 NOTE — Discharge Instructions (Signed)
Candidal Vulvovaginitis Candidal vulvovaginitis is an infection of the vagina and vulva. The vulva is the skin around the opening of the vagina. This may cause itching and discomfort in and around the vagina.  HOME CARE  Only take medicine as told by your doctor.  Do not have sex (intercourse) until the infection is healed or as told by your doctor.  Practice safe sex.  Tell your sex partner about your infection.  Do not douche or use tampons.  Wear cotton underwear. Do not wear tight pants or panty hose.  Eat yogurt. This may help treat and prevent yeast infections. GET HELP RIGHT AWAY IF:   You have a fever.  Your problems get worse during treatment or do not get better in 3 days.  You have discomfort, irritation, or itching in your vagina or vulva area.  You have pain after sex.  You start to get belly (abdominal) pain. MAKE SURE YOU:  Understand these instructions.  Will watch your condition.  Will get help right away if you are not doing well or get worse. Document Released: 05/31/2008 Document Revised: 03/09/2013 Document Reviewed: 05/31/2008 [facility] Patient Information 2015 [place], [place]. This information is not intended to replace advice given to you by your health care provider. Make sure you discuss any questions you have with your health care provider.  Bacterial Vaginosis Bacterial vaginosis is an infection of the vagina. It happens when too many of certain germs (bacteria) grow in the vagina. HOME CARE  Take your medicine as told by your doctor.  Finish your medicine even if you start to feel better.  Do not have sex until you finish your medicine and are better.  Tell your sex partner that you have an infection. They should see their doctor for treatment.  Practice safe sex. Use condoms. Have only one sex partner. GET HELP IF:  You are not getting better after 3 days of treatment.  You have more grey fluid (discharge) coming from your vagina  than before.  You have more pain than before.  You have a fever. MAKE SURE YOU:   Understand these instructions.  Will watch your condition.  Will get help right away if you are not doing well or get worse. Document Released: 12/12/2007 Document Revised: 12/23/2012 Document Reviewed: 10/14/2012 [facility] Patient Information 2015 [place], [place]. This information is not intended to replace advice given to you by your health care provider. Make sure you discuss any questions you have with your health care provider.  Genital Herpes Genital herpes is a sexually transmitted disease. This means that it is a disease passed by having sex with an infected person. There is no cure for genital herpes. The time between attacks can be months to years. The virus may live in a person but produce no problems (symptoms). This infection can be passed to a baby as it travels down the birth canal (vagina). In a newborn, this can cause central nervous system damage, eye damage, or even death. The virus that causes genital herpes is usually HSV-2 virus. The virus that causes oral herpes is usually HSV-1. The diagnosis (learning what is wrong) is made through culture results. SYMPTOMS  Usually symptoms of pain and itching begin a few days to a week after contact. It first appears as small blisters that progress to small painful ulcers which then scab over and heal after several days. It affects the outer genitalia, birth canal, cervix, penis, anal area, buttocks, and thighs. HOME CARE INSTRUCTIONS   Keep  ulcerated areas dry and clean.  Take medications as directed. Antiviral medications can speed up healing. They will not prevent recurrences or cure this infection. These medications can also be taken for suppression if there are frequent recurrences.  While the infection is active, it is contagious. Avoid all sexual contact during active infections.  Condoms may help prevent spread of the herpes  virus.  Practice safe sex.  Wash your hands thoroughly after touching the genital area.  Avoid touching your eyes after touching your genital area.  Inform your caregiver if you have had genital herpes and become pregnant. It is your responsibility to insure a safe outcome for your baby in this pregnancy.  Only take over-the-counter or prescription medicines for pain, discomfort, or fever as directed by your caregiver. SEEK MEDICAL CARE IF:   You have a recurrence of this infection.  You do not respond to medications and are not improving.  You have new sources of pain or discharge which have changed from the original infection.  You have an oral temperature above 102 F (38.9 C).  You develop abdominal pain.  You develop eye pain or signs of eye infection. Document Released: 03/01/2000 Document Revised: 05/27/2011 Document Reviewed: 03/22/2009 [facility] Patient Information 2015 [place], [place]. This information is not intended to replace advice given to you by your health care provider. Make sure you discuss any questions you have with your health care provider.

## 2013-10-24 NOTE — ED Notes (Signed)
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  She is in no distress and states she has seen one blister in her vaginal area.

## 2013-10-26 LAB — GC/CHLAMYDIA PROBE AMP
CT Probe RNA: NEGATIVE
GC Probe RNA: NEGATIVE

## 2014-03-18 NOTE — L&D Delivery Note (Signed)
FHR 130-140 w/ variables to 90-100 w/ pushing, also late decel x 1, therefore 02 and side lying pushing instituted until delivery.  Delivery Note At 11:06 PM a viable female "Lenice" was delivered via Vaginal, Spontaneous Delivery (Presentation: OA restituting to Right Occiput Anterior). APGARS: 8, 9; weight 6 lbs 15.1 oz (3150 g). Thick meconium noted after delivery.   Placenta status: Intact, Spontaneous (meconium stained).  Cord: 3 vessels with the following complications: None. Cord pH: NA  Anesthesia: Epidural  Episiotomy: None Lacerations: Skid marks  Suture Repair: NA Est. Blood Loss (mL): 400. 1000 mcg of Cytotec placed pr due to brisk bright red bleeding. Bladder drained. Bleeding minimal after interventions.Fundus firm throughout.   Mom to postpartum.  Baby to Couplet care / Skin to Skin.  Pt plans to breastfeed.  Birth control not discussed.  Farrel Gordon 02/11/2015, 12:07 AM

## 2014-06-24 ENCOUNTER — Ambulatory Visit (INDEPENDENT_AMBULATORY_CARE_PROVIDER_SITE_OTHER): Payer: Managed Care, Other (non HMO) | Admitting: Internal Medicine

## 2014-06-24 ENCOUNTER — Encounter: Payer: Self-pay | Admitting: Internal Medicine

## 2014-06-24 VITALS — BP 118/78 | HR 88 | Temp 98.3°F | Resp 18 | Ht 64.0 in | Wt 137.0 lb

## 2014-06-24 DIAGNOSIS — K529 Noninfective gastroenteritis and colitis, unspecified: Secondary | ICD-10-CM | POA: Diagnosis not present

## 2014-06-24 MED ORDER — DIPHENOXYLATE-ATROPINE 2.5-0.025 MG PO TABS: 1.0000 | ORAL_TABLET | Freq: Four times a day (QID) | ORAL | Status: DC | PRN

## 2014-06-24 MED ORDER — ONDANSETRON HCL 4 MG PO TABS: 4.0000 mg | ORAL_TABLET | Freq: Three times a day (TID) | ORAL | Status: DC | PRN

## 2014-06-24 NOTE — Patient Instructions (Signed)
Please take all new medication as prescribed - the nausea and diarrhea medications as needed  Please drink plenty of fluids, small sips all the time  Please continue all other medications as before, and refills have been done if requested.  Please have the pharmacy call with any other refills you may need.  Please keep your appointments with your specialists as you may have planned  You are given the school note as well

## 2014-06-24 NOTE — Assessment & Plan Note (Signed)
liekly c/w acute viral illness, for freq sips fluids, zofran prn nausea and lomotil prn diarrhea, f/u any worsening vomiting, pain, fever, blood, declines labs today

## 2014-06-24 NOTE — Progress Notes (Signed)
Subjective:    Patient ID: Holly Thornton, female    DOB: August 20, 1990, 24 y.o.   MRN: [id number]  HPI  Here with 5 days onset fatigue, general weakness, feverish, cold feeling and chills, crampy abd pain, recurrent n/v, and mult watery stools daily; Denies worsening reflux, other abd pain, dysphagia, or blood.  Has hx of UC but inactive for many years. Pt denies chest pain, increased sob or doe, wheezing, orthopnea, PND, increased LE swelling, palpitations, dizziness or syncope.   Pt denies polydipsia, polyuria Past Medical History  Diagnosis Date  . Colitis   . Asthma     child  . Herpes genitalis in women    Past Surgical History  Procedure Laterality Date  . No past surgeries      reports that she has never smoked. She has never used smokeless tobacco. She reports that she drinks about 0.5 oz of alcohol per week. She reports that she does not use illicit drugs. family history includes Anemia in her mother; Depression in her mother; Diabetes in her maternal grandmother; Fibroids in her mother. Allergies  Allergen Reactions  . Amoxicillin Anaphylaxis and Swelling  . Peanut-Containing Drug Products Anaphylaxis, Hives, Diarrhea, Rash and Other (See Comments)    Puffiness of the eys    Current Outpatient Prescriptions on File Prior to Visit  Medication Sig Dispense Refill  . fluconazole (DIFLUCAN) 150 MG tablet Take 1 tablet (150 mg total) by mouth once. (Patient not taking: Reported on 06/24/2014) 1 tablet 1  . ibuprofen (ADVIL,MOTRIN) 200 MG tablet Take 200,440 mg by mouth every 6 (six) hours as needed for moderate pain.    Marland Kitchen lidocaine (XYLOCAINE JELLY) 2 % jelly Apply 1 application topically 3 (three) times daily. As needed for pain (Patient not taking: Reported on 06/24/2014) 30 mL 0  . metroNIDAZOLE (FLAGYL) 500 MG tablet Take 1 tablet (500 mg total) by mouth 2 (two) times daily. Do not drink alcohol with this medication. (Patient not taking: Reported on 06/24/2014) 14 tablet 0  .  norethindrone-ethinyl estradiol (JUNEL FE,GILDESS FE,LOESTRIN FE) 1-20 MG-MCG tablet Take 1 tablet by mouth daily.     No current facility-administered medications on file prior to visit.     Review of Systems  Constitutional: Negative for unusual diaphoresis or night sweats HENT: Negative for ringing in ear or discharge Eyes: Negative for double vision or worsening visual disturbance.  Respiratory: Negative for choking and stridor.   Gastrointestinal: Negative for vomiting or other signifcant bowel change Genitourinary: Negative for hematuria or change in urine volume.  Musculoskeletal: Negative for other MSK pain or swelling Skin: Negative for color change and worsening wound.  Neurological: Negative for tremors and numbness other than noted  Psychiatric/Behavioral: Negative for decreased concentration or agitation other than above       Objective:   Physical Exam BP 118/78 mmHg  Pulse 88  Temp(Src) 98.3 F (36.8 C) (Oral)  Resp 18  Ht 5\' 4"  (1.626 m)  Wt 137 lb 0.6 oz (62.161 kg)  BMI 23.51 kg/m2  SpO2 97% VS noted, mild ill appearing Constitutional: Pt appears in no significant distress HENT: Head: NCAT.  Right Ear: External ear normal.  Left Ear: External ear normal.  Eyes: . Pupils are equal, round, and reactive to light. Conjunctivae and EOM are normal Bilat tm's with mild erythema.  Max sinus areas non tender.  Pharynx with mild erythema, no exudate Neck: Normal range of motion. Neck supple.  Cardiovascular: Normal rate and regular rhythm.  Pulmonary/Chest: Effort normal and breath sounds without rales or wheezing.  Abd:  Soft, ND, + BS with diffuse mild tender, no guarding/rebound Neurological: Pt is alert. Not confused , motor grossly intact Skin: Skin is warm. No rash, no LE edema Psychiatric: Pt behavior is normal. No agitation.     Assessment & Plan:

## 2014-06-24 NOTE — Progress Notes (Signed)
Pre visit review using our clinic review tool, if applicable. No additional management support is needed unless otherwise documented below in the visit note. 

## 2014-06-29 ENCOUNTER — Other Ambulatory Visit: Payer: Self-pay

## 2014-06-29 ENCOUNTER — Encounter: Payer: Self-pay | Admitting: Internal Medicine

## 2014-06-29 ENCOUNTER — Ambulatory Visit (INDEPENDENT_AMBULATORY_CARE_PROVIDER_SITE_OTHER): Payer: Managed Care, Other (non HMO) | Admitting: Internal Medicine

## 2014-06-29 VITALS — BP 118/72 | HR 73 | Temp 97.6°F | Wt 132.2 lb

## 2014-06-29 DIAGNOSIS — N912 Amenorrhea, unspecified: Secondary | ICD-10-CM

## 2014-06-29 DIAGNOSIS — N390 Urinary tract infection, site not specified: Secondary | ICD-10-CM | POA: Diagnosis not present

## 2014-06-29 DIAGNOSIS — K529 Noninfective gastroenteritis and colitis, unspecified: Secondary | ICD-10-CM | POA: Diagnosis not present

## 2014-06-29 DIAGNOSIS — R82998 Other abnormal findings in urine: Secondary | ICD-10-CM

## 2014-06-29 DIAGNOSIS — Z3490 Encounter for supervision of normal pregnancy, unspecified, unspecified trimester: Secondary | ICD-10-CM

## 2014-06-29 LAB — URINALYSIS, ROUTINE W REFLEX MICROSCOPIC
Ketones, ur: >=80 — AB
Nitrite: NEGATIVE
RBC / HPF: NONE SEEN (ref 0–?)
Total Protein, Urine: 30 — AB
Urine Glucose: NEGATIVE
Urobilinogen, UA: 0.2 (ref 0.0–1.0)
pH: 5.5 (ref 5.0–8.0)

## 2014-06-29 LAB — POCT URINALYSIS DIPSTICK
Bilirubin, UA: NEGATIVE
Glucose, UA: NEGATIVE
Nitrite, UA: NEGATIVE
Spec Grav, UA: >=1.03
Urobilinogen, UA: 0.2
pH, UA: 6

## 2014-06-29 LAB — CBC WITH DIFFERENTIAL/PLATELET
BASOS PCT: 0.4 % (ref 0.0–3.0)
Basophils Absolute: 0 10*3/uL (ref 0.0–0.1)
EOS ABS: 0 10*3/uL (ref 0.0–0.7)
Eosinophils Relative: 0.2 % (ref 0.0–5.0)
HCT: 41.5 % (ref 36.0–46.0)
Hemoglobin: 13.9 g/dL (ref 12.0–15.0)
Lymphocytes Relative: 14.7 % (ref 12.0–46.0)
Lymphs Abs: 1.4 10*3/uL (ref 0.7–4.0)
MCHC: 33.5 g/dL (ref 30.0–36.0)
MCV: 84.5 fl (ref 78.0–100.0)
MONOS PCT: 6 % (ref 3.0–12.0)
Monocytes Absolute: 0.6 10*3/uL (ref 0.1–1.0)
NEUTROS PCT: 78.7 % — AB (ref 43.0–77.0)
Neutro Abs: 7.7 10*3/uL (ref 1.4–7.7)
Platelets: 258 10*3/uL (ref 150.0–400.0)
RBC: 4.91 Mil/uL (ref 3.87–5.11)
RDW: 13.2 % (ref 11.5–15.5)
WBC: 9.8 10*3/uL (ref 4.0–10.5)

## 2014-06-29 LAB — BASIC METABOLIC PANEL
BUN: 9 mg/dL (ref 6–23)
CHLORIDE: 102 meq/L (ref 96–112)
CO2: 21 meq/L (ref 19–32)
Calcium: 9.9 mg/dL (ref 8.4–10.5)
Creatinine, Ser: 0.76 mg/dL (ref 0.40–1.20)
GFR: 120.45 mL/min (ref >60.00)
GLUCOSE: 109 mg/dL — AB (ref 70–99)
POTASSIUM: 3.5 meq/L (ref 3.5–5.1)
Sodium: 132 mEq/L — ABNORMAL LOW (ref 135–145)

## 2014-06-29 LAB — POCT URINE PREGNANCY: Preg Test, Ur: POSITIVE

## 2014-06-29 NOTE — Progress Notes (Signed)
Pre visit review using our clinic review tool, if applicable. No additional management support is needed unless otherwise documented below in the visit note. 

## 2014-06-29 NOTE — Patient Instructions (Signed)
Go to the lab to get the blood work  Start  taking a prenatal vitamin today  For the nausea:  Continue taking Zofran as needed, drink plenty of clear fluids. If you are not gradually back to normal within 2 or 3 days : let us know Call or go to the ER if you have severe symptoms, high fever, or if you feel dizzy and very  weak.  We are referring you to a obstetritian for prenatal care.

## 2014-06-29 NOTE — Progress Notes (Signed)
Subjective:    Patient ID: Holly Thornton, female    DOB: 15-Feb-1991, 24 y.o.   MRN: [id number]  DOS:  06/29/2014 Type of visit - description : acute Interval history: The patient stopped taking birth control pills in January, last menstrual period 05/16/2014. On 06-24-2014 was seen with diarrhea nausea and vomiting. Was prescribed Lomotil which she took few times and Zofran for nausea. As she was late on her period, she did a home pregnancy test and it came back positive. Here for further eval.    Review of Systems  At this point the diarrhea is almost gone, still having nausea but is relatively well controlled with Zofran. Had fever with the onset of symptoms, that is resolved. No chills No blood in the stools, mild abdominal pain. No dysuria, gross hematuria difficulty urinating. No vaginal discharge or bleeding. Denies any recent weight loss or polydipsia    Past Medical History  Diagnosis Date  . Colitis     at age 52  . Asthma     child  . Herpes genitalis in women     Past Surgical History  Procedure Laterality Date  . No past surgeries      History   Social History  . Marital Status: Single    Spouse Name: N/A  . Number of Children: 0  . Years of Education: N/A   Occupational History  . college    Social History Main Topics  . Smoking status: Never Smoker   . Smokeless tobacco: Never Used  . Alcohol Use: 0.5 oz/week    1 drink(s) per week     Comment: occasionally  . Drug Use: No  . Sexual Activity: Not on file   Other Topics Concern  . Not on file   Social History Narrative   Regular exercise:  Yes, walks   Caffeine use:  2 glasses soda daily                    Medication List       This list is accurate as of: 06/29/14 11:59 PM.  Always use your most recent med list.               ondansetron 4 MG tablet  Commonly known as:  ZOFRAN  Take 1 tablet (4 mg total) by mouth every 8 (eight) hours as needed for nausea or  vomiting.           Objective:   Physical Exam BP 118/72 mmHg  Pulse 73  Temp(Src) 97.6 F (36.4 C) (Oral)  Wt 132 lb 4 oz (59.988 kg)  SpO2 97%  LMP 05/15/2014  General:   Well developed, well nourished . NAD.  HEENT:  Normocephalic . Face symmetric, atraumatic Lungs:  CTA B Normal respiratory effort, no intercostal retractions, no accessory muscle use. Heart: RRR,  no murmur.  Abdomen,  Slightly TTP at the epigastric area, not tender to palpation at the right upper and lower quadrant; no mass or rebound, not distended, + bowel sounds. Muscle skeletal: no pretibial edema bilaterally  Skin: Not pale. Not jaundice Neurologic:  alert & oriented X3.  Speech normal, gait appropriate for age and unassisted Psych--  Cognition and judgment appear intact.  Cooperative with normal attention span and concentration.  Behavior appropriate. No anxious or depressed appearing.       Assessment & Plan:    Pregnancy, UPT at this office positive, patient is pregnant, recommend prenatal vitamins and referred to obstetrics  Gastroenteritis, Improving, recommend to avoid Lomotil and takes Zofran as needed for nausea. See instructions  UTI? Urinalysis showed  leukocytes, proteins and ketones. No sugars. Will check a BMP and CBC (dehydration?)   Get a UA, urine culture and treat based on cultures

## 2014-06-30 LAB — URINE CULTURE: Colony Count: 50000

## 2014-07-01 ENCOUNTER — Encounter (HOSPITAL_COMMUNITY): Payer: Self-pay

## 2014-07-01 ENCOUNTER — Emergency Department (HOSPITAL_COMMUNITY)
Admission: EM | Admit: 2014-07-01 | Discharge: 2014-07-01 | Disposition: A | Discharge status: Home / Self Care | Payer: Managed Care, Other (non HMO) | Attending: Emergency Medicine | Admitting: Emergency Medicine

## 2014-07-01 DIAGNOSIS — Z8719 Personal history of other diseases of the digestive system: Secondary | ICD-10-CM | POA: Diagnosis not present

## 2014-07-01 DIAGNOSIS — Z79899 Other long term (current) drug therapy: Secondary | ICD-10-CM | POA: Insufficient documentation

## 2014-07-01 DIAGNOSIS — O219 Vomiting of pregnancy, unspecified: Secondary | ICD-10-CM

## 2014-07-01 DIAGNOSIS — Z8619 Personal history of other infectious and parasitic diseases: Secondary | ICD-10-CM | POA: Diagnosis not present

## 2014-07-01 DIAGNOSIS — Z3A01 Less than 8 weeks gestation of pregnancy: Secondary | ICD-10-CM | POA: Insufficient documentation

## 2014-07-01 DIAGNOSIS — O99511 Diseases of the respiratory system complicating pregnancy, first trimester: Secondary | ICD-10-CM | POA: Insufficient documentation

## 2014-07-01 DIAGNOSIS — J45909 Unspecified asthma, uncomplicated: Secondary | ICD-10-CM | POA: Diagnosis not present

## 2014-07-01 DIAGNOSIS — Z880 Allergy status to penicillin: Secondary | ICD-10-CM | POA: Insufficient documentation

## 2014-07-01 DIAGNOSIS — O210 Mild hyperemesis gravidarum: Secondary | ICD-10-CM | POA: Insufficient documentation

## 2014-07-01 LAB — URINE MICROSCOPIC-ADD ON

## 2014-07-01 LAB — URINALYSIS, ROUTINE W REFLEX MICROSCOPIC
GLUCOSE, UA: NEGATIVE mg/dL
HGB URINE DIPSTICK: NEGATIVE
Nitrite: NEGATIVE
Protein, ur: NEGATIVE mg/dL
Specific Gravity, Urine: 1.026 (ref 1.005–1.030)
Urobilinogen, UA: 1 mg/dL (ref 0.0–1.0)
pH: 6 (ref 5.0–8.0)

## 2014-07-01 LAB — I-STAT CHEM 8, ED
BUN: 8 mg/dL (ref 6–23)
CALCIUM ION: 1.18 mmol/L (ref 1.12–1.23)
Chloride: 101 mmol/L (ref 96–112)
Creatinine, Ser: 0.7 mg/dL (ref 0.50–1.10)
Glucose, Bld: 100 mg/dL — ABNORMAL HIGH (ref 70–99)
HEMATOCRIT: 45 % (ref 36.0–46.0)
HEMOGLOBIN: 15.3 g/dL — AB (ref 12.0–15.0)
Potassium: 3.1 mmol/L — ABNORMAL LOW (ref 3.5–5.1)
Sodium: 137 mmol/L (ref 135–145)
TCO2: 20 mmol/L (ref 0–100)

## 2014-07-01 MED ORDER — ACETAMINOPHEN 325 MG PO TABS
650.0000 mg | ORAL_TABLET | Freq: Once | ORAL | Status: AC
  Administered 2014-07-01: 650 mg via ORAL
  Filled 2014-07-01: qty 2

## 2014-07-01 MED ORDER — SODIUM CHLORIDE 0.9 % IV SOLN
1000.0000 mL | Freq: Once | INTRAVENOUS | Status: AC
  Administered 2014-07-01: 1000 mL via INTRAVENOUS

## 2014-07-01 MED ORDER — PROMETHAZINE HCL 25 MG RE SUPP: 25.0000 mg | Freq: Four times a day (QID) | RECTAL | Status: DC | PRN

## 2014-07-01 MED ORDER — ONDANSETRON 8 MG PO TBDP: 8.0000 mg | ORAL_TABLET | Freq: Three times a day (TID) | ORAL | Status: DC | PRN

## 2014-07-01 MED ORDER — DOXYLAMINE-PYRIDOXINE 10-10 MG PO TBEC: DELAYED_RELEASE_TABLET | ORAL | Status: DC

## 2014-07-01 MED ORDER — ONDANSETRON HCL 4 MG/2ML IJ SOLN
4.0000 mg | Freq: Once | INTRAMUSCULAR | Status: AC
  Administered 2014-07-01: 4 mg via INTRAVENOUS
  Filled 2014-07-01: qty 2

## 2014-07-01 MED ORDER — SODIUM CHLORIDE 0.9 % IV SOLN
1000.0000 mL | INTRAVENOUS | Status: DC
  Administered 2014-07-01: 1000 mL via INTRAVENOUS

## 2014-07-01 NOTE — Discharge Instructions (Signed)
Morning Sickness Morning sickness is when you feel sick to your stomach (nauseous) during pregnancy. This nauseous feeling may or may not come with vomiting. It often occurs in the morning but can be a problem any time of day. Morning sickness is most common during the first trimester, but it may continue throughout pregnancy. While morning sickness is unpleasant, it is usually harmless unless you develop severe and continual vomiting (hyperemesis gravidarum). This condition requires more intense treatment.  CAUSES  The cause of morning sickness is not completely known but seems to be related to normal hormonal changes that occur in pregnancy. RISK FACTORS You are at greater risk if you:  Experienced nausea or vomiting before your pregnancy.  Had morning sickness during a previous pregnancy.  Are pregnant with more than one baby, such as twins. TREATMENT  Do not use any medicines (prescription, over-the-counter, or herbal) for morning sickness without first talking to your health care provider. Your health care provider may prescribe or recommend:  Vitamin B6 supplements.  Anti-nausea medicines.  The herbal medicine ginger. HOME CARE INSTRUCTIONS   Only take over-the-counter or prescription medicines as directed by your health care provider.  Taking multivitamins before getting pregnant can prevent or decrease the severity of morning sickness in most women.  Eat a piece of dry toast or unsalted crackers before getting out of bed in the morning.  Eat five or six small meals a day.  Eat dry and bland foods (rice, baked potato). Foods high in carbohydrates are often helpful.  Do not drink liquids with your meals. Drink liquids between meals.  Avoid greasy, fatty, and spicy foods.  Get someone to cook for you if the smell of any food causes nausea and vomiting.  If you feel nauseous after taking prenatal vitamins, take the vitamins at night or with a snack.  Snack on protein  foods (nuts, yogurt, cheese) between meals if you are hungry.  Eat unsweetened gelatins for desserts.  Wearing an acupressure wristband (worn for sea sickness) may be helpful.  Acupuncture may be helpful.  Do not smoke.  Get a humidifier to keep the air in your house free of odors.  Get plenty of fresh air. SEEK MEDICAL CARE IF:   Your home remedies are not working, and you need medicine.  You feel dizzy or lightheaded.  You are losing weight. SEEK IMMEDIATE MEDICAL CARE IF:   You have persistent and uncontrolled nausea and vomiting.  You pass out (faint). MAKE SURE YOU:  Understand these instructions.  Will watch your condition.  Will get help right away if you are not doing well or get worse. Document Released: 04/25/2006 Document Revised: 03/09/2013 Document Reviewed: 08/19/2012 [facility] Patient Information 2015 [place], [place]. This information is not intended to replace advice given to you by your health care provider. Make sure you discuss any questions you have with your health care provider.  Eating Plan for Hyperemesis Gravidarum Severe cases of hyperemesis gravidarum can lead to dehydration and malnutrition. The hyperemesis eating plan is one way to lessen the symptoms of nausea and vomiting. It is often used with prescribed medicines to control your symptoms.  WHAT CAN I DO TO RELIEVE MY SYMPTOMS? Listen to your body. Everyone is different and has different preferences. Find what works best for you. Some of the following things may help:  Eat and drink slowly.  Eat 5-6 small meals daily instead of 3 large meals.   Eat crackers before you get out of bed in the morning.  Starchy foods are usually well tolerated (such as cereal, toast, bread, potatoes, pasta, rice, and pretzels).   Ginger may help with nausea. Add  tsp ground ginger to hot tea or choose ginger tea.   Try drinking 100% fruit juice or an electrolyte drink.  Continue to take your  prenatal vitamins as directed by your health care provider. If you are having trouble taking your prenatal vitamins, talk with your health care provider about different options.  Include at least 1 serving of protein with your meals and snacks (such as meats or poultry, beans, nuts, eggs, or yogurt). Try eating a protein-rich snack before bed (such as cheese and crackers or a half [place] or peanut butter sandwich). WHAT THINGS SHOULD I AVOID TO REDUCE MY SYMPTOMS? The following things may help reduce your symptoms:  Avoid foods with strong smells. Try eating meals in well-ventilated areas that are free of odors.  Avoid drinking water or other beverages with meals. Try not to drink anything less than 30 minutes before and after meals.  Avoid drinking more than 1 cup of fluid at a time.  Avoid fried or high-fat foods, such as butter and cream sauces.  Avoid spicy foods.  Avoid skipping meals the best you can. Nausea can be more intense on an empty stomach. If you cannot tolerate food at that time, do not force it. Try sucking on ice chips or other frozen items and make up the calories later.  Avoid lying down within 2 hours after eating. Document Released: 12/30/2006 Document Revised: 03/09/2013 Document Reviewed: 01/06/2013 [facility] Patient Information 2015 [place], [place]. This information is not intended to replace advice given to you by your health care provider. Make sure you discuss any questions you have with your health care provider.

## 2014-07-01 NOTE — ED Provider Notes (Signed)
CSN: [id number]     Arrival date & time 07/01/14  2202 History   First MD Initiated Contact with Patient 07/01/14 0236     Chief Complaint  Patient presents with  . Morning Sickness     (Consider location/radiation/quality/duration/timing/severity/associated sxs/prior Treatment) HPI 24 year old female presents to emergency department with complaint of nausea and vomiting that has been persistent over the last week.  She was recently diagnosed with pregnancy 2 days ago, LMP 2/29.  Patient has been unable to keep down Zofran.  Patient initially was diagnosed with gastroenteritis, but reports diarrhea has stopped.  She does not yet have a OB.  This is her first pregnancy.  She denies any fever, chills, or dysuria. Past Medical History  Diagnosis Date  . Colitis     at age 67  . Asthma     child  . Herpes genitalis in women    Past Surgical History  Procedure Laterality Date  . No past surgeries     Family History  Problem Relation Age of Onset  . Depression Mother   . Fibroids Mother   . Anemia Mother   . Diabetes Maternal Grandmother    History  Substance Use Topics  . Smoking status: Never Smoker   . Smokeless tobacco: Never Used  . Alcohol Use: 0.5 oz/week    1 drink(s) per week     Comment: occasionally   OB History    No data available     Review of Systems   See History of Present Illness; otherwise all other systems are reviewed and negative  Allergies  Amoxicillin and Peanut-containing drug products  Home Medications   Prior to Admission medications   Medication Sig Start Date End Date Taking? Authorizing Provider  ondansetron (ZOFRAN) 4 MG tablet Take 1 tablet (4 mg total) by mouth every 8 (eight) hours as needed for nausea or vomiting. 06/24/14  Yes Biagio Borg, MD  Prenatal Vit-Fe Fumarate-FA (PRENATAL MULTIVITAMIN) TABS tablet Take 1 tablet by mouth daily at 12 noon.   Yes Historical Provider, MD  Doxylamine-Pyridoxine 10-10 MG TBEC Take 2 tabs po qhs.   If symptoms persist after 2 days, increase to 1 tab po qam and 2 tabs po qhs.  If symptoms persist, may increase to 1 tab po qam, 1 tab po mid afternoon and 2 tab po qhs 07/01/14   Linton Flemings, MD  ondansetron (ZOFRAN ODT) 8 MG disintegrating tablet Take 1 tablet (8 mg total) by mouth every 8 (eight) hours as needed for nausea or vomiting. 07/01/14   Linton Flemings, MD  promethazine (PHENERGAN) 25 MG suppository Place 1 suppository (25 mg total) rectally every 6 (six) hours as needed for nausea or vomiting. 07/01/14   Linton Flemings, MD   BP 115/74 mmHg  Pulse 88  Temp(Src) 98.1 F (36.7 C) (Oral)  Resp 18  Ht 5\' 4"  (1.626 m)  Wt 135 lb (61.236 kg)  BMI 23.16 kg/m2  SpO2 100%  LMP 05/15/2014 Physical Exam  Constitutional: She is oriented to person, place, and time. She appears well-developed and well-nourished.  HENT:  Head: Normocephalic and atraumatic.  Nose: Nose normal.  Mouth/Throat: Oropharynx is clear and moist.  Dry mucous membranes  Eyes: Conjunctivae and EOM are normal. Pupils are equal, round, and reactive to light.  Neck: Normal range of motion. Neck supple. No JVD present. No tracheal deviation present. No thyromegaly present.  Cardiovascular: Normal rate, regular rhythm, normal heart sounds and intact distal pulses.  Exam reveals no gallop  and no friction rub.   No murmur heard. Pulmonary/Chest: Effort normal and breath sounds normal. No stridor. No respiratory distress. She has no wheezes. She has no rales. She exhibits no tenderness.  Abdominal: Soft. Bowel sounds are normal. She exhibits no distension and no mass. There is no tenderness. There is no rebound and no guarding.  Musculoskeletal: Normal range of motion. She exhibits no edema or tenderness.  Lymphadenopathy:    She has no cervical adenopathy.  Neurological: She is alert and oriented to person, place, and time. She displays normal reflexes. She exhibits normal muscle tone. Coordination normal.  Skin: Skin is warm and  dry. No rash noted. No erythema. No pallor.  Psychiatric: She has a normal mood and affect. Her behavior is normal. Judgment and thought content normal.  Nursing note and vitals reviewed.   ED Course  Procedures (including critical care time) Labs Review Labs Reviewed  URINALYSIS, ROUTINE W REFLEX MICROSCOPIC - Abnormal; Notable for the following:    Color, Urine AMBER (*)    APPearance CLOUDY (*)    Bilirubin Urine SMALL (*)    Ketones, ur >80 (*)    Leukocytes, UA LARGE (*)    All other components within normal limits  URINE MICROSCOPIC-ADD ON - Abnormal; Notable for the following:    Squamous Epithelial / LPF MANY (*)    Bacteria, UA FEW (*)    All other components within normal limits  I-STAT CHEM 8, ED - Abnormal; Notable for the following:    Potassium 3.1 (*)    Glucose, Bld 100 (*)    Hemoglobin 15.3 (*)    All other components within normal limits  URINE CULTURE    Imaging Review No results found.   EKG Interpretation None      MDM   Final diagnoses:  Morning sickness      6:07 AM Pt has received 3 liters of fluid.  She reports nausea is gone.  Will d/c to f/u with MAU for further problems, has f/u with OB pending.  Will d/c with diclegis and instructions on how to formulate otc, and other   Linton Flemings, MD 07/01/14 [id number]

## 2014-07-01 NOTE — ED Notes (Signed)
Pt is [redacted] weeks pregnant and she's having morning sickness, she's unable to stop vomiting and was told by her doctor to come to the ED to get some fluids

## 2014-07-02 LAB — URINE CULTURE: Colony Count: 75000

## 2014-07-10 ENCOUNTER — Encounter (HOSPITAL_COMMUNITY): Payer: Self-pay | Admitting: *Deleted

## 2014-07-10 ENCOUNTER — Inpatient Hospital Stay (HOSPITAL_COMMUNITY)
Admission: AD | Admit: 2014-07-10 | Discharge: 2014-07-10 | Disposition: A | Discharge status: Home / Self Care | Payer: Medicaid Other | Source: Ambulatory Visit | Attending: Obstetrics & Gynecology | Admitting: Obstetrics & Gynecology

## 2014-07-10 DIAGNOSIS — Z3A08 8 weeks gestation of pregnancy: Secondary | ICD-10-CM | POA: Insufficient documentation

## 2014-07-10 DIAGNOSIS — O210 Mild hyperemesis gravidarum: Secondary | ICD-10-CM | POA: Insufficient documentation

## 2014-07-10 DIAGNOSIS — K5900 Constipation, unspecified: Secondary | ICD-10-CM | POA: Insufficient documentation

## 2014-07-10 LAB — COMPREHENSIVE METABOLIC PANEL
ALT: 23 U/L (ref 0–35)
AST: 25 U/L (ref 0–37)
Albumin: 5.1 g/dL (ref 3.5–5.2)
Alkaline Phosphatase: 31 U/L — ABNORMAL LOW (ref 39–117)
Anion gap: 13 (ref 5–15)
BUN: 10 mg/dL (ref 6–23)
CALCIUM: 9.8 mg/dL (ref 8.4–10.5)
CO2: 22 mmol/L (ref 19–32)
CREATININE: 0.63 mg/dL (ref 0.50–1.10)
Chloride: 97 mmol/L (ref 96–112)
GLUCOSE: 94 mg/dL (ref 70–99)
Potassium: 3.3 mmol/L — ABNORMAL LOW (ref 3.5–5.1)
Sodium: 132 mmol/L — ABNORMAL LOW (ref 135–145)
TOTAL PROTEIN: 8.2 g/dL (ref 6.0–8.3)
Total Bilirubin: 0.8 mg/dL (ref 0.3–1.2)

## 2014-07-10 LAB — URINE MICROSCOPIC-ADD ON

## 2014-07-10 LAB — URINALYSIS, ROUTINE W REFLEX MICROSCOPIC
Glucose, UA: NEGATIVE mg/dL
Ketones, ur: >80 mg/dL — AB
NITRITE: NEGATIVE
PH: 6 (ref 5.0–8.0)
PROTEIN: 100 mg/dL — AB
Specific Gravity, Urine: >1.03 — ABNORMAL HIGH (ref 1.005–1.030)
UROBILINOGEN UA: 1 mg/dL (ref 0.0–1.0)

## 2014-07-10 LAB — CBC
HCT: 39.5 % (ref 36.0–46.0)
HEMOGLOBIN: 14.4 g/dL (ref 12.0–15.0)
MCH: 28.8 pg (ref 26.0–34.0)
MCHC: 36.5 g/dL — AB (ref 30.0–36.0)
MCV: 79 fL (ref 78.0–100.0)
PLATELETS: 263 10*3/uL (ref 150–400)
RBC: 5 MIL/uL (ref 3.87–5.11)
RDW: 12.2 % (ref 11.5–15.5)
WBC: 8.9 10*3/uL (ref 4.0–10.5)

## 2014-07-10 MED ORDER — PROMETHAZINE HCL 25 MG PO TABS: 25.0000 mg | ORAL_TABLET | Freq: Four times a day (QID) | ORAL | Status: DC | PRN

## 2014-07-10 MED ORDER — PROMETHAZINE HCL 25 MG/ML IJ SOLN
25.0000 mg | Freq: Once | INTRAMUSCULAR | Status: AC
  Administered 2014-07-10: 25 mg via INTRAVENOUS
  Filled 2014-07-10: qty 1

## 2014-07-10 MED ORDER — M.V.I. ADULT IV INJ
Freq: Once | INTRAVENOUS | Status: AC
  Administered 2014-07-10: 14:00:00 via INTRAVENOUS
  Filled 2014-07-10: qty 1000

## 2014-07-10 NOTE — MAU Note (Signed)
Pt says she cannot keep anything down, she says this started around two weeks ago, got a little better but has gotten worse since yesterday.  Denies VB/cramping.

## 2014-07-10 NOTE — MAU Provider Note (Signed)
History     CSN: [id number]  Arrival date and time: 07/10/14 1200   First Provider Initiated Contact with Patient 07/10/14 1228      Chief Complaint  Patient presents with  . Nausea  . Emesis   HPI Holly Thornton 24 y.o. [redacted]w[redacted]d  Comes to MAU with nausea and vomiting.  Family accompanies her.  Unable to keep down food all week.  Was at [facility] ER on .  Was prescribed Zofran and Phenergan suppositories.  Has been using Zofran but stopped it on Friday night as it did not seem to be helping.  Has continued to have vomiting.  Client states she was unaware of the Phenergan suppositories.  Also reports constipation this week.  Has an appointment on Thursday to see Dr. Charlesetta Garibaldi for the first prenatal visit in this pregnancy.  Has been seen 4 times this month by different providers- primary care and ER visits.  Weight last week at ER was 135.  Weight today is 121.   OB History    Gravida Para Term Preterm AB TAB SAB Ectopic Multiple Living   1               Past Medical History  Diagnosis Date  . Colitis     at age 63  . Asthma     child  . Herpes genitalis in women     Past Surgical History  Procedure Laterality Date  . No past surgeries      Family History  Problem Relation Age of Onset  . Depression Mother   . Fibroids Mother   . Anemia Mother   . Diabetes Maternal Grandmother     History  Substance Use Topics  . Smoking status: Never Smoker   . Smokeless tobacco: Never Used  . Alcohol Use: 0.5 oz/week    1 Standard drinks or equivalent per week     Comment: occasionally    Allergies:  Allergies  Allergen Reactions  . Amoxicillin Anaphylaxis and Swelling  . Peanut-Containing Drug Products Anaphylaxis, Hives, Diarrhea, Rash and Other (See Comments)    Puffiness of the eys     Prescriptions prior to admission  Medication Sig Dispense Refill Last Dose  . Doxylamine-Pyridoxine 10-10 MG TBEC Take 2 tabs po qhs.  If symptoms persist after 2 days, increase to 1 tab  po qam and 2 tabs po qhs.  If symptoms persist, may increase to 1 tab po qam, 1 tab po mid afternoon and 2 tab po qhs 60 tablet 0   . ondansetron (ZOFRAN ODT) 8 MG disintegrating tablet Take 1 tablet (8 mg total) by mouth every 8 (eight) hours as needed for nausea or vomiting. 20 tablet 0   . ondansetron (ZOFRAN) 4 MG tablet Take 1 tablet (4 mg total) by mouth every 8 (eight) hours as needed for nausea or vomiting. 30 tablet 0 06/30/2014 at Unknown time  . Prenatal Vit-Fe Fumarate-FA (PRENATAL MULTIVITAMIN) TABS tablet Take 1 tablet by mouth daily at 12 noon.   06/30/2014 at Unknown time  . promethazine (PHENERGAN) 25 MG suppository Place 1 suppository (25 mg total) rectally every 6 (six) hours as needed for nausea or vomiting. 12 suppository 0     Review of Systems  Constitutional: Negative for fever.  Gastrointestinal: Positive for nausea and vomiting. Negative for abdominal pain.  Genitourinary:       No vaginal discharge. No vaginal bleeding. No leaking. No dysuria.   Physical Exam   Blood pressure 126/87, pulse 94,  temperature 97.9 F (36.6 C), temperature source Oral, resp. rate 18, height 5\' 3"  (1.6 m), weight 121 lb 9.6 oz (55.157 kg), last menstrual period 05/15/2014.  Physical Exam  Nursing note and vitals reviewed. Constitutional: She is oriented to person, place, and time. She appears well-developed and well-nourished.  No vomiting while in MAU.  Lying in bed.  HENT:  Head: Normocephalic.  Eyes: EOM are normal.  Neck: Neck supple.  Respiratory: Effort normal.  Musculoskeletal: Normal range of motion.  Neurological: She is alert and oriented to person, place, and time.  Skin: Skin is warm and dry.  Psychiatric: She has a normal mood and affect.    MAU Course  Procedures Results for orders placed or performed during the hospital encounter of 07/10/14 (from the past 24 hour(s))  Urinalysis, Routine w reflex microscopic     Status: Abnormal   Collection Time: 07/10/14  12:08 PM  Result Value Ref Range   Color, Urine YELLOW YELLOW   APPearance CLEAR CLEAR   Specific Gravity, Urine >1.030 (H) 1.005 - 1.030   pH 6.0 5.0 - 8.0   Glucose, UA NEGATIVE NEGATIVE mg/dL   Hgb urine dipstick TRACE (A) NEGATIVE   Bilirubin Urine MODERATE (A) NEGATIVE   Ketones, ur >80 (A) NEGATIVE mg/dL   Protein, ur 100 (A) NEGATIVE mg/dL   Urobilinogen, UA 1.0 0.0 - 1.0 mg/dL   Nitrite NEGATIVE NEGATIVE   Leukocytes, UA SMALL (A) NEGATIVE  Urine microscopic-add on     Status: Abnormal   Collection Time: 07/10/14 12:08 PM  Result Value Ref Range   Squamous Epithelial / LPF MANY (A) RARE   WBC, UA 11-20 <3 WBC/hpf   RBC / HPF 0-2 <3 RBC/hpf   Bacteria, UA FEW (A) RARE   Urine-Other MUCOUS PRESENT   CBC     Status: Abnormal   Collection Time: 07/10/14 12:50 PM  Result Value Ref Range   WBC 8.9 4.0 - 10.5 K/uL   RBC 5.00 3.87 - 5.11 MIL/uL   Hemoglobin 14.4 12.0 - 15.0 g/dL   HCT 39.5 36.0 - 46.0 %   MCV 79.0 78.0 - 100.0 fL   MCH 28.8 26.0 - 34.0 pg   MCHC 36.5 (H) 30.0 - 36.0 g/dL   RDW 12.2 11.5 - 15.5 %   Platelets 263 150 - 400 K/uL  Comprehensive metabolic panel     Status: Abnormal   Collection Time: 07/10/14 12:50 PM  Result Value Ref Range   Sodium 132 (L) 135 - 145 mmol/L   Potassium 3.3 (L) 3.5 - 5.1 mmol/L   Chloride 97 96 - 112 mmol/L   CO2 22 19 - 32 mmol/L   Glucose, Bld 94 70 - 99 mg/dL   BUN 10 6 - 23 mg/dL   Creatinine, Ser 0.63 0.50 - 1.10 mg/dL   Calcium 9.8 8.4 - 10.5 mg/dL   Total Protein 8.2 6.0 - 8.3 g/dL   Albumin 5.1 3.5 - 5.2 g/dL   AST 25 0 - 37 U/L   ALT 23 0 - 35 U/L   Alkaline Phosphatase 31 (L) 39 - 117 U/L   Total Bilirubin 0.8 0.3 - 1.2 mg/dL   GFR calc non Af Amer >90 >90 mL/min   GFR calc Af Amer >90 >90 mL/min   Anion gap 13 5 - 15    MDM Constipation may be coming from Zofran and from minimal intake the last week.  Advised to take Miralax by the package directions on a daily basis while  taking Zofran.  Additionally  intake has been less this week and with less food intake, there are less bowel movements.   Will order another urine culture to see if possibly a UTI is complicating her physical condition or if urine results today are due more to dehydration.  Bag of fluids with Phenergan infused - patient is sleeping.  Will encourage ice chips on awakening.  Will give another bag of fluids with multivitamins.  Awakened to discuss discharge instructions.  Family members in the room for discussion.  Client able to take ice chips well and says the nausea is relieved at present.  Assessment and Plan  Hyperemesis  Plan Will need to get phenergan suppositories filled and use also to assist with controlling the vomiting.  Also prescribed Phenergan tablets and discussed to use the suppositories if unable to keep down the tablets.  Advised 1/2 tablet if taking one tablet makes her too drowsy. Advised liquid/soft diet tonight and advance as tolerated.   Urine culture pending.  Will notify client if additional medication is needed. Keep your appointment to begin prenatal care on Thursday.  BURLESON,TERRI 07/10/2014, 12:47 PM

## 2014-07-10 NOTE — Discharge Instructions (Signed)
Use your medications to control the nausea and vomiting.  Can use Zofran and Phenergan together.  If you are vomiting the Phenergan tablets, you can use the phenergan suppositories. Eat a liquid or soft diet and advance slowly as tolerated.  Do not eat fried foods or high fat foods. Keep your appointment to begin prenatal care.

## 2014-07-12 LAB — URINE CULTURE: Colony Count: 30000

## 2014-07-14 LAB — OB RESULTS CONSOLE HIV ANTIBODY (ROUTINE TESTING): HIV: NONREACTIVE

## 2014-07-14 LAB — OB RESULTS CONSOLE RUBELLA ANTIBODY, IGM: Rubella: IMMUNE

## 2014-07-14 LAB — OB RESULTS CONSOLE ABO/RH: RH Type: POSITIVE

## 2014-07-14 LAB — OB RESULTS CONSOLE GC/CHLAMYDIA
CHLAMYDIA, DNA PROBE: NEGATIVE
GC PROBE AMP, GENITAL: NEGATIVE

## 2014-07-14 LAB — OB RESULTS CONSOLE HEPATITIS B SURFACE ANTIGEN: HEP B S AG: NEGATIVE

## 2014-07-14 LAB — OB RESULTS CONSOLE ANTIBODY SCREEN: ANTIBODY SCREEN: NEGATIVE

## 2014-07-14 LAB — OB RESULTS CONSOLE RPR: RPR: NONREACTIVE

## 2014-12-04 ENCOUNTER — Encounter (HOSPITAL_COMMUNITY): Payer: Self-pay | Admitting: *Deleted

## 2014-12-04 ENCOUNTER — Inpatient Hospital Stay (HOSPITAL_COMMUNITY)
Admission: AD | Admit: 2014-12-04 | Discharge: 2014-12-04 | Disposition: A | Discharge status: Home / Self Care | Payer: Medicaid Other | Source: Ambulatory Visit | Attending: Obstetrics and Gynecology | Admitting: Obstetrics and Gynecology

## 2014-12-04 DIAGNOSIS — O26893 Other specified pregnancy related conditions, third trimester: Secondary | ICD-10-CM | POA: Diagnosis not present

## 2014-12-04 DIAGNOSIS — R21 Rash and other nonspecific skin eruption: Secondary | ICD-10-CM | POA: Insufficient documentation

## 2014-12-04 DIAGNOSIS — Z3A29 29 weeks gestation of pregnancy: Secondary | ICD-10-CM | POA: Insufficient documentation

## 2014-12-04 DIAGNOSIS — L259 Unspecified contact dermatitis, unspecified cause: Secondary | ICD-10-CM

## 2014-12-04 LAB — URINALYSIS, ROUTINE W REFLEX MICROSCOPIC
BILIRUBIN URINE: NEGATIVE
GLUCOSE, UA: NEGATIVE mg/dL
Ketones, ur: NEGATIVE mg/dL
Nitrite: NEGATIVE
PH: 6.5 (ref 5.0–8.0)
Protein, ur: NEGATIVE mg/dL
Specific Gravity, Urine: 1.02 (ref 1.005–1.030)
Urobilinogen, UA: 0.2 mg/dL (ref 0.0–1.0)

## 2014-12-04 LAB — URINE MICROSCOPIC-ADD ON

## 2014-12-04 LAB — POCT PREGNANCY, URINE: PREG TEST UR: POSITIVE — AB

## 2014-12-04 NOTE — Discharge Instructions (Signed)

## 2014-12-04 NOTE — MAU Note (Signed)
Patient presents at [redacted] weeks gestation with c/o a slightly red, nonraised rash that doesn't itch between her thighs X 3 days. Fetus active. Denies bleeding or discharge.

## 2014-12-04 NOTE — MAU Provider Note (Signed)
History    Holly Thornton is a 24y.o. G1P0 at 29wks who presents, unannounced, for rash.  Patient reports rash started two days ago after extended ambulation. Patient reports onset of pain last night, but had relief after application of cortisone cream to area per husbands suggestion.  Patient reports history of eczema, but denies itching, pustules, or ongoing issues with the area.  Patient denies LoF, VB, ctx, and reports active fetus.  Patient Active Problem List   Diagnosis Date Noted  . Gastroenteritis, acute 06/24/2014  . Encounter for routine pelvic examination 07/24/2013  . Unspecified contraceptive management 07/24/2013  . Muscle spasm of back 12/10/2012  . Need for prophylactic vaccination and inoculation against influenza 12/10/2012  . Routine general medical examination at a health care facility 10/09/2012  . Night sweats 05/13/2012  . Colitis, indeterminate 05/13/2012  . ADHD 03/01/2009    Chief Complaint  Patient presents with  . Rash   HPI  OB History    Gravida Para Term Preterm AB TAB SAB Ectopic Multiple Living   1 0        0      Past Medical History  Diagnosis Date  . Colitis     at age 67  . Asthma     child  . Herpes genitalis in women     Past Surgical History  Procedure Laterality Date  . No past surgeries      Family History  Problem Relation Age of Onset  . Depression Mother   . Fibroids Mother   . Anemia Mother   . Diabetes Maternal Grandmother     Social History  Substance Use Topics  . Smoking status: Never Smoker   . Smokeless tobacco: Never Used  . Alcohol Use: 0.5 oz/week    1 Standard drinks or equivalent per week     Comment: occasionally    Allergies:  Allergies  Allergen Reactions  . Amoxicillin Anaphylaxis and Swelling  . Peanut-Containing Drug Products Anaphylaxis, Hives, Diarrhea, Rash and Other (See Comments)    Puffiness of the eys     Prescriptions prior to admission  Medication Sig Dispense Refill Last  Dose  . acetaminophen (TYLENOL) 325 MG tablet Take 650 mg by mouth every 6 (six) hours as needed for mild pain or headache.   Past Week at Unknown time  . Doxylamine-Pyridoxine 10-10 MG TBEC Take 2 tabs po qhs.  If symptoms persist after 2 days, increase to 1 tab po qam and 2 tabs po qhs.  If symptoms persist, may increase to 1 tab po qam, 1 tab po mid afternoon and 2 tab po qhs (Patient not taking: Reported on 07/10/2014) 60 tablet 0   . ondansetron (ZOFRAN ODT) 8 MG disintegrating tablet Take 1 tablet (8 mg total) by mouth every 8 (eight) hours as needed for nausea or vomiting. 20 tablet 0 Past Week at Unknown time  . Prenatal Vit-Min-FA-Fish Oil ([employer] PRENATAL GUMMY) 0.4-113.5 MG CHEW Chew 2 each by mouth daily.   Past Week at Unknown time  . promethazine (PHENERGAN) 25 MG suppository Place 1 suppository (25 mg total) rectally every 6 (six) hours as needed for nausea or vomiting. (Patient not taking: Reported on 07/10/2014) 12 suppository 0   . promethazine (PHENERGAN) 25 MG tablet Take 1 tablet (25 mg total) by mouth every 6 (six) hours as needed for nausea or vomiting. 30 tablet 0     ROS  See HPI Above Physical Exam   Blood pressure 123/65, pulse 109,  temperature 98.1 F (36.7 C), temperature source Oral, resp. rate 16, height 5\' 3"  (1.6 m), weight 55.14 kg (121 lb 9 oz), last menstrual period 05/15/2014.  Results for orders placed or performed during the hospital encounter of 12/04/14 (from the past 24 hour(s))  Pregnancy, urine POC     Status: Abnormal   Collection Time: 12/04/14  1:27 PM  Result Value Ref Range   Preg Test, Ur POSITIVE (A) NEGATIVE    Physical Exam  Skin: Skin is warm, dry and intact. Rash noted.  Bilateral inner thighs: 3cmx1cm irritations noted.  Striations apparent, redness, appears dry.  Eczema type presentation     FHR:145 bpm, Mod Var, -Decels, +Accels UC: None graphed or palpated ED Course  Assessment: IUP at 29 weeks Cat I FT Rash  Plan: -Patient  informed that most likely eczema flair or friction burn from thighs rubbing together -Patient states she felt this may be the case and "only came cause my mom told me to." -Instructed to continue hydrocortisone cream BID and at HS after shower -Report if symptoms worsen -Bleeding and PTL precautions -Given information regarding who and when to call for pregnancy related concerns -Encouraged to call if any questions or concerns arise prior to next scheduled office visit.  -Keep appt as scheduled: 12/09/2014   [facility], [place] Sloop LYNN CNM, MSN 12/04/2014 1:43 PM

## 2014-12-25 ENCOUNTER — Encounter (HOSPITAL_COMMUNITY): Payer: Self-pay | Admitting: *Deleted

## 2014-12-25 ENCOUNTER — Inpatient Hospital Stay (HOSPITAL_COMMUNITY)
Admission: AD | Admit: 2014-12-25 | Discharge: 2014-12-25 | Disposition: A | Discharge status: Home / Self Care | Payer: Medicaid Other | Source: Ambulatory Visit | Attending: Obstetrics & Gynecology | Admitting: Obstetrics & Gynecology

## 2014-12-25 DIAGNOSIS — B373 Candidiasis of vulva and vagina: Secondary | ICD-10-CM

## 2014-12-25 DIAGNOSIS — O26893 Other specified pregnancy related conditions, third trimester: Secondary | ICD-10-CM | POA: Diagnosis not present

## 2014-12-25 DIAGNOSIS — B3731 Acute candidiasis of vulva and vagina: Secondary | ICD-10-CM

## 2014-12-25 DIAGNOSIS — Z3A32 32 weeks gestation of pregnancy: Secondary | ICD-10-CM | POA: Diagnosis not present

## 2014-12-25 LAB — WET PREP, GENITAL
CLUE CELLS WET PREP: NONE SEEN
Trich, Wet Prep: NONE SEEN

## 2014-12-25 LAB — AMNISURE RUPTURE OF MEMBRANE (ROM) NOT AT ARMC: Amnisure ROM: NEGATIVE

## 2014-12-25 MED ORDER — TERCONAZOLE 0.4 % VA CREA: 1.0000 | TOPICAL_CREAM | Freq: Every day | VAGINAL | Status: DC

## 2014-12-25 NOTE — Discharge Instructions (Signed)

## 2014-12-25 NOTE — MAU Note (Signed)
About 1230 got out of shower and was drying off. Bent over drying feet and gush fld came out. Have leaked a small amt since then. Slight pain lower back

## 2014-12-25 NOTE — MAU Provider Note (Signed)
History    Holly Thornton is a 24 y.o. G1P0 at 32wks who presents, after phone call, for leakage of fluid.  Patient states she had gush of fluid, after getting out of shower. Instructed to put on pad and observe for 30 minutes and patient reports pad was damp.  Patient states that she has had some back pain that started while ambulating.  Patient reports active fetus and denies VB or contractions.  Patient further denies constipation, diarrhea, other pain, or issues with urination.   Patient Active Problem List   Diagnosis Date Noted  . Gastroenteritis, acute 06/24/2014  . Encounter for routine pelvic examination 07/24/2013  . Unspecified contraceptive management 07/24/2013  . Muscle spasm of back 12/10/2012  . Need for prophylactic vaccination and inoculation against influenza 12/10/2012  . Routine general medical examination at a health care facility 10/09/2012  . Night sweats 05/13/2012  . Colitis, indeterminate 05/13/2012  . ADHD 03/01/2009    Chief Complaint  Patient presents with  . Rupture of Membranes   HPI  OB History    Gravida Para Term Preterm AB TAB SAB Ectopic Multiple Living   1 0        0      Past Medical History  Diagnosis Date  . Colitis     at age 48  . Asthma     child  . Herpes genitalis in women     Past Surgical History  Procedure Laterality Date  . No past surgeries      Family History  Problem Relation Age of Onset  . Depression Mother   . Fibroids Mother   . Anemia Mother   . Diabetes Maternal Grandmother     Social History  Substance Use Topics  . Smoking status: Never Smoker   . Smokeless tobacco: Never Used  . Alcohol Use: 0.5 oz/week    1 Standard drinks or equivalent per week     Comment: occasionally    Allergies:  Allergies  Allergen Reactions  . Amoxicillin Anaphylaxis and Swelling  . Peanut-Containing Drug Products Anaphylaxis, Hives, Diarrhea, Rash and Other (See Comments)    Puffiness of the eys      Prescriptions prior to admission  Medication Sig Dispense Refill Last Dose  . acetaminophen (TYLENOL) 500 MG tablet Take 1,000 mg by mouth every 6 (six) hours as needed for moderate pain.   Past Week at Unknown time  . FIBER, GUAR GUM, PO Take 1 tablet by mouth daily.    12/03/2014 at Unknown time  . Hydrocortisone ([id number]-27 EX) Apply 1 application topically 2 (two) times daily.   12/04/2014 at Unknown time  . Prenatal Vit-Min-FA-Fish Oil ([employer] PRENATAL GUMMY) 0.4-113.5 MG CHEW Chew 2 each by mouth daily.   12/04/2014 at Unknown time    ROS  See HPI Above Physical Exam   Blood pressure 118/75, pulse 112, temperature 98 F (36.7 C), resp. rate 18, height 5\' 3"  (1.6 m), weight 75.206 kg (165 lb 12.8 oz), last menstrual period 05/15/2014.  No results found for this or any previous visit (from the past 24 hour(s)).  Physical Exam  Constitutional: She is oriented to person, place, and time. She appears well-developed and well-nourished.  HENT:  Head: Normocephalic and atraumatic.  Eyes: EOM are normal.  Neck: Normal range of motion.  Cardiovascular: Normal rate, regular rhythm and normal heart sounds.   Respiratory: Effort normal and breath sounds normal.  GI: Soft.  Musculoskeletal: Normal range of motion.  Neurological: She is  alert and oriented to person, place, and time.  Skin: Skin is warm and dry.   FHR:142 bpm, Mod Var, -Decels, +Accels UC: None graphed or palpated ED Course  Assessment: IUP at 32wks Cat I FT Leakage of Fluid  Plan: -PE as above -Labs: UA, Wet Prep, Amnisure -Discussed yeast infection and vaginal hygiene  Follow Up (0255) -Wet Prep-+ Yeast  -Amnisure Negative -In room to discuss results and reinforce vaginal hygiene -Instructed to reduce usage of scented body wash -Rx for Terazol 0.4 x 7 days at hs  -Keep appt as scheduled: 01/09/2015 -Encouraged to call if any questions or concerns arise prior to next scheduled office visit.  -Discharged to  home in improved condition  Preston Garabedian LYNN CNM, MSN 12/25/2014 2:13 AM

## 2015-01-25 LAB — OB RESULTS CONSOLE GBS: STREP GROUP B AG: NEGATIVE

## 2015-01-31 ENCOUNTER — Inpatient Hospital Stay (HOSPITAL_COMMUNITY)
Admission: AD | Admit: 2015-01-31 | Discharge: 2015-01-31 | Disposition: A | Discharge status: Home / Self Care | Payer: Medicaid Other | Source: Ambulatory Visit | Attending: Obstetrics and Gynecology | Admitting: Obstetrics and Gynecology

## 2015-01-31 ENCOUNTER — Encounter (HOSPITAL_COMMUNITY): Payer: Self-pay | Admitting: *Deleted

## 2015-01-31 DIAGNOSIS — Z3A37 37 weeks gestation of pregnancy: Secondary | ICD-10-CM | POA: Diagnosis not present

## 2015-01-31 DIAGNOSIS — O212 Late vomiting of pregnancy: Secondary | ICD-10-CM | POA: Diagnosis present

## 2015-01-31 DIAGNOSIS — R112 Nausea with vomiting, unspecified: Secondary | ICD-10-CM

## 2015-01-31 DIAGNOSIS — E860 Dehydration: Secondary | ICD-10-CM | POA: Insufficient documentation

## 2015-01-31 LAB — URINALYSIS, ROUTINE W REFLEX MICROSCOPIC
Bilirubin Urine: NEGATIVE
GLUCOSE, UA: NEGATIVE mg/dL
Ketones, ur: 15 mg/dL — AB
Nitrite: NEGATIVE
PH: 6.5 (ref 5.0–8.0)
Protein, ur: NEGATIVE mg/dL
Specific Gravity, Urine: 1.02 (ref 1.005–1.030)

## 2015-01-31 LAB — URINE MICROSCOPIC-ADD ON

## 2015-01-31 MED ORDER — ONDANSETRON HCL 4 MG PO TABS: 4.0000 mg | ORAL_TABLET | Freq: Three times a day (TID) | ORAL | Status: DC | PRN

## 2015-01-31 MED ORDER — DEXTROSE IN LACTATED RINGERS 5 % IV SOLN
INTRAVENOUS | Status: AC
  Administered 2015-01-31: 15:00:00 via INTRAVENOUS

## 2015-01-31 MED ORDER — SODIUM CHLORIDE 0.9 % IV SOLN
8.0000 mg | Freq: Once | INTRAVENOUS | Status: AC
  Administered 2015-01-31: 8 mg via INTRAVENOUS
  Filled 2015-01-31: qty 4

## 2015-01-31 NOTE — MAU Note (Addendum)
Vomiting since yesterday, keeping a small amount of liquids down.  Started having diarrhea around 1100 - 2 episodes.    Having lower abd cramping, denies bleeding.  C/O watery discharge for the past 2 days.

## 2015-01-31 NOTE — Discharge Instructions (Signed)
Nausea and Vomiting  Nausea means you feel sick to your stomach. Throwing up (vomiting) is a reflex where stomach contents come out of your mouth.  HOME CARE   · Take medicine as told by your doctor.  · Do not force yourself to eat. However, you do need to drink fluids.  · If you feel like eating, eat a normal diet as told by your doctor.    Eat rice, wheat, potatoes, bread, lean meats, yogurt, fruits, and vegetables.    Avoid high-fat foods.  · Drink enough fluids to keep your pee (urine) clear or pale yellow.  · Ask your doctor how to replace body fluid losses (rehydrate). Signs of body fluid loss (dehydration) include:    Feeling very thirsty.    Dry lips and mouth.    Feeling dizzy.    Dark pee.    Peeing less than normal.    Feeling confused.    Fast breathing or heart rate.  GET HELP RIGHT AWAY IF:   · You have blood in your throw up.  · You have black or bloody poop (stool).  · You have a bad headache or stiff neck.  · You feel confused.  · You have bad belly (abdominal) pain.  · You have chest pain or trouble breathing.  · You do not pee at least once every 8 hours.  · You have cold, clammy skin.  · You keep throwing up after 24 to 48 hours.  · You have a fever.  MAKE SURE YOU:   · Understand these instructions.  · Will watch your condition.  · Will get help right away if you are not doing well or get worse.     This information is not intended to replace advice given to you by your health care provider. Make sure you discuss any questions you have with your health care provider.     Document Released: 08/21/2007 Document Revised: 05/27/2011 Document Reviewed: 08/03/2010  [employer]vier Interactive Patient Education ©2016 [employer]

## 2015-01-31 NOTE — MAU Provider Note (Signed)
History   24 yo, G1P0, 37.2wks presented to the MAU after calling the [place] office.  Pt c/o N&V that started 25 hrs ago.   States she has not been able to keep food or water down over the past 24 hrs.   Denies HA or stomach pain.  States she has occasional BH contractions and is not currently feeling any contractions. Also complains of increase watery discharge when she wipes. Discharge does not  Itch or have an odor. Denies Lof .  Affirms +FM.   Patient Active Problem List   Diagnosis Date Noted  . Gastroenteritis, acute 06/24/2014  . Encounter for routine pelvic examination 07/24/2013  . Unspecified contraceptive management 07/24/2013  . Muscle spasm of back 12/10/2012  . Need for prophylactic vaccination and inoculation against influenza 12/10/2012  . Routine general medical examination at a health care facility 10/09/2012  . Night sweats 05/13/2012  . Colitis, indeterminate 05/13/2012  . ADHD 03/01/2009    Chief Complaint  Patient presents with  . Emesis During Pregnancy  . Abdominal Pain   HPI  OB History    Gravida Para Term Preterm AB TAB SAB Ectopic Multiple Living   1 0        0      Past Medical History  Diagnosis Date  . Colitis     at age 15  . Asthma     child  . Herpes genitalis in women     Past Surgical History  Procedure Laterality Date  . No past surgeries      Family History  Problem Relation Age of Onset  . Depression Mother   . Fibroids Mother   . Anemia Mother   . Diabetes Maternal Grandmother     Social History  Substance Use Topics  . Smoking status: Never Smoker   . Smokeless tobacco: Never Used  . Alcohol Use: 0.5 oz/week    1 Standard drinks or equivalent per week     Comment: occasionally    Allergies:  Allergies  Allergen Reactions  . Amoxicillin Anaphylaxis and Swelling  . Peanut-Containing Drug Products Anaphylaxis, Hives, Diarrhea, Rash and Other (See Comments)    Puffiness of the eys     Prescriptions prior to  admission  Medication Sig Dispense Refill Last Dose  . acetaminophen (TYLENOL) 500 MG tablet Take 1,000 mg by mouth every 6 (six) hours as needed for moderate pain.   Past Week at Unknown time  . calcium carbonate (TUMS - DOSED IN MG ELEMENTAL CALCIUM) 500 MG chewable tablet Chew 2 tablets by mouth daily as needed for indigestion or heartburn.   Past Week at Unknown time  . Prenatal Vit-Min-FA-Fish Oil ([facility] PRENATAL GUMMY) 0.4-113.5 MG CHEW Chew 2 each by mouth daily.   01/31/2015 at Unknown time  . FIBER, GUAR GUM, PO Take 1 tablet by mouth daily as needed (for constipation).    prn  . terconazole (TERAZOL 7) 0.4 % vaginal cream Place 1 applicator vaginally at bedtime. (Patient not taking: Reported on 01/31/2015) 45 g 0 Not Taking at Unknown time    ROS - see above Physical Exam   Blood pressure 114/74, pulse 113, temperature 97.9 F (36.6 C), temperature source Oral, resp. rate 18, last menstrual period 05/15/2014.  Results for orders placed or performed during the hospital encounter of 01/31/15 (from the past 24 hour(s))  Urinalysis, Routine w reflex microscopic (not at [facility])     Status: Abnormal   Collection Time: 01/31/15  1:45 PM  Result  Value Ref Range   Color, Urine YELLOW YELLOW   APPearance CLOUDY (A) CLEAR   Specific Gravity, Urine 1.020 1.005 - 1.030   pH 6.5 5.0 - 8.0   Glucose, UA NEGATIVE NEGATIVE mg/dL   Hgb urine dipstick SMALL (A) NEGATIVE   Bilirubin Urine NEGATIVE NEGATIVE   Ketones, ur 15 (A) NEGATIVE mg/dL   Protein, ur NEGATIVE NEGATIVE mg/dL   Nitrite NEGATIVE NEGATIVE   Leukocytes, UA MODERATE (A) NEGATIVE  Urine microscopic-add on     Status: Abnormal   Collection Time: 01/31/15  1:45 PM  Result Value Ref Range   Squamous Epithelial / LPF 6-30 (A) NONE SEEN   WBC, UA 6-30 0 - 5 WBC/hpf   RBC / HPF 6-30 0 - 5 RBC/hpf   Bacteria, UA MANY (A) NONE SEEN   Urine-Other MUCOUS PRESENT     Cat 1 tracing, 130 bpm, moderate variability, + Accels, no decels UC:   Irritable, no contractions  SVE: deferred   Physical Exam  ED Course  Assessment: IUP at 37.2wks Nausea & Vomiting Dehydration  - Elevated Ketones,  15 mg/dl   Plan: IV hydration Zofran 8 mg  Discussed normal discharge changes in pregnancy   Keep scheduled appt at [facility] tomorrow, 02/01/15 Call if symptom worsen   Lavetta Nielsen CNM, MSN 01/31/2015 2:53 PM

## 2015-02-10 ENCOUNTER — Inpatient Hospital Stay (HOSPITAL_COMMUNITY)
Admission: AD | Admit: 2015-02-10 | Discharge: 2015-02-12 | DRG: 775 | Disposition: A | Discharge status: Home / Self Care | Payer: Medicaid Other | Source: Ambulatory Visit | Attending: Obstetrics and Gynecology | Admitting: Obstetrics and Gynecology

## 2015-02-10 ENCOUNTER — Inpatient Hospital Stay (HOSPITAL_COMMUNITY): Payer: Medicaid Other | Admitting: Anesthesiology

## 2015-02-10 ENCOUNTER — Encounter (HOSPITAL_COMMUNITY): Payer: Self-pay

## 2015-02-10 DIAGNOSIS — R030 Elevated blood-pressure reading, without diagnosis of hypertension: Secondary | ICD-10-CM | POA: Diagnosis present

## 2015-02-10 DIAGNOSIS — O9081 Anemia of the puerperium: Secondary | ICD-10-CM | POA: Diagnosis present

## 2015-02-10 DIAGNOSIS — T7801XA Anaphylactic reaction due to peanuts, initial encounter: Secondary | ICD-10-CM

## 2015-02-10 DIAGNOSIS — Z833 Family history of diabetes mellitus: Secondary | ICD-10-CM | POA: Diagnosis not present

## 2015-02-10 DIAGNOSIS — Z880 Allergy status to penicillin: Secondary | ICD-10-CM | POA: Diagnosis not present

## 2015-02-10 DIAGNOSIS — Z87440 Personal history of urinary (tract) infections: Secondary | ICD-10-CM

## 2015-02-10 DIAGNOSIS — Z8719 Personal history of other diseases of the digestive system: Secondary | ICD-10-CM

## 2015-02-10 DIAGNOSIS — Z889 Allergy status to unspecified drugs, medicaments and biological substances status: Secondary | ICD-10-CM

## 2015-02-10 DIAGNOSIS — Z6830 Body mass index (BMI) 30.0-30.9, adult: Secondary | ICD-10-CM | POA: Diagnosis not present

## 2015-02-10 DIAGNOSIS — O99214 Obesity complicating childbirth: Secondary | ICD-10-CM | POA: Diagnosis present

## 2015-02-10 DIAGNOSIS — E669 Obesity, unspecified: Secondary | ICD-10-CM

## 2015-02-10 DIAGNOSIS — Z3A38 38 weeks gestation of pregnancy: Secondary | ICD-10-CM

## 2015-02-10 DIAGNOSIS — D649 Anemia, unspecified: Secondary | ICD-10-CM | POA: Diagnosis present

## 2015-02-10 DIAGNOSIS — Z8249 Family history of ischemic heart disease and other diseases of the circulatory system: Secondary | ICD-10-CM | POA: Diagnosis not present

## 2015-02-10 HISTORY — DX: Attention-deficit hyperactivity disorder, unspecified type: F90.9

## 2015-02-10 HISTORY — DX: Urinary tract infection, site not specified: N39.0

## 2015-02-10 LAB — COMPREHENSIVE METABOLIC PANEL
ALBUMIN: 3.6 g/dL (ref 3.5–5.0)
ALK PHOS: 203 U/L — AB (ref 38–126)
ALT: 18 U/L (ref 14–54)
ANION GAP: 9 (ref 5–15)
AST: 25 U/L (ref 15–41)
BILIRUBIN TOTAL: 0.5 mg/dL (ref 0.3–1.2)
BUN: 9 mg/dL (ref 6–20)
CALCIUM: 10.8 mg/dL — AB (ref 8.9–10.3)
CO2: 22 mmol/L (ref 22–32)
Chloride: 103 mmol/L (ref 101–111)
Creatinine, Ser: 0.53 mg/dL (ref 0.44–1.00)
GLUCOSE: 90 mg/dL (ref 65–99)
POTASSIUM: 4.1 mmol/L (ref 3.5–5.1)
Sodium: 134 mmol/L — ABNORMAL LOW (ref 135–145)
TOTAL PROTEIN: 7.4 g/dL (ref 6.5–8.1)

## 2015-02-10 LAB — RPR: RPR Ser Ql: NONREACTIVE

## 2015-02-10 LAB — CBC
HEMATOCRIT: 35.7 % — AB (ref 36.0–46.0)
Hemoglobin: 11.8 g/dL — ABNORMAL LOW (ref 12.0–15.0)
MCH: 27.4 pg (ref 26.0–34.0)
MCHC: 33.1 g/dL (ref 30.0–36.0)
MCV: 83 fL (ref 78.0–100.0)
Platelets: 185 10*3/uL (ref 150–400)
RBC: 4.3 MIL/uL (ref 3.87–5.11)
RDW: 14.8 % (ref 11.5–15.5)
WBC: 9.8 10*3/uL (ref 4.0–10.5)

## 2015-02-10 LAB — PROTEIN / CREATININE RATIO, URINE
CREATININE, URINE: 113 mg/dL
Protein Creatinine Ratio: 0.19 mg/mg{Cre} — ABNORMAL HIGH (ref 0.00–0.15)
Total Protein, Urine: 22 mg/dL

## 2015-02-10 LAB — URIC ACID: URIC ACID, SERUM: 4.4 mg/dL (ref 2.3–6.6)

## 2015-02-10 LAB — TYPE AND SCREEN
ABO/RH(D): O POS
ANTIBODY SCREEN: NEGATIVE

## 2015-02-10 LAB — ABO/RH: ABO/RH(D): O POS

## 2015-02-10 LAB — LACTATE DEHYDROGENASE: LDH: 179 U/L (ref 98–192)

## 2015-02-10 LAB — POCT FERN TEST: POCT Fern Test: POSITIVE

## 2015-02-10 MED ORDER — DIPHENHYDRAMINE HCL 50 MG/ML IJ SOLN: 12.5000 mg | INTRAMUSCULAR | Status: DC | PRN

## 2015-02-10 MED ORDER — NALBUPHINE HCL 10 MG/ML IJ SOLN
10.0000 mg | INTRAMUSCULAR | Status: DC | PRN
  Administered 2015-02-10 (×2): 10 mg via INTRAVENOUS
  Filled 2015-02-10 (×2): qty 1

## 2015-02-10 MED ORDER — PHENYLEPHRINE 40 MCG/ML (10ML) SYRINGE FOR IV PUSH (FOR BLOOD PRESSURE SUPPORT)
80.0000 ug | PREFILLED_SYRINGE | INTRAVENOUS | Status: DC | PRN
  Filled 2015-02-10: qty 2
  Filled 2015-02-10: qty 20

## 2015-02-10 MED ORDER — FENTANYL 2.5 MCG/ML BUPIVACAINE 1/10 % EPIDURAL INFUSION (WH - ANES)
14.0000 mL/h | INTRAMUSCULAR | Status: DC | PRN
  Administered 2015-02-10 (×2): 14 mL/h via EPIDURAL
  Filled 2015-02-10 (×2): qty 125

## 2015-02-10 MED ORDER — TERBUTALINE SULFATE 1 MG/ML IJ SOLN
0.2500 mg | Freq: Once | INTRAMUSCULAR | Status: AC | PRN
  Administered 2015-02-10: 0.25 mg via SUBCUTANEOUS
  Filled 2015-02-10: qty 1

## 2015-02-10 MED ORDER — OXYCODONE-ACETAMINOPHEN 5-325 MG PO TABS: 2.0000 | ORAL_TABLET | ORAL | Status: DC | PRN

## 2015-02-10 MED ORDER — OXYCODONE-ACETAMINOPHEN 5-325 MG PO TABS
1.0000 | ORAL_TABLET | ORAL | Status: DC | PRN
  Administered 2015-02-11: 1 via ORAL
  Filled 2015-02-10: qty 1

## 2015-02-10 MED ORDER — OXYTOCIN BOLUS FROM INFUSION
500.0000 mL | INTRAVENOUS | Status: DC
  Administered 2015-02-10: 500 mL via INTRAVENOUS

## 2015-02-10 MED ORDER — ONDANSETRON HCL 4 MG/2ML IJ SOLN
4.0000 mg | Freq: Four times a day (QID) | INTRAMUSCULAR | Status: DC | PRN
  Administered 2015-02-10 (×2): 4 mg via INTRAVENOUS
  Filled 2015-02-10 (×2): qty 2

## 2015-02-10 MED ORDER — LACTATED RINGERS IV SOLN
INTRAVENOUS | Status: DC
  Administered 2015-02-10: 300 mL via INTRAUTERINE

## 2015-02-10 MED ORDER — MISOPROSTOL 200 MCG PO TABS: 50.0000 ug | ORAL_TABLET | ORAL | Status: DC | PRN

## 2015-02-10 MED ORDER — MISOPROSTOL 200 MCG PO TABS
ORAL_TABLET | ORAL | Status: AC
  Filled 2015-02-10: qty 5

## 2015-02-10 MED ORDER — HYDROXYZINE HCL 50 MG PO TABS
50.0000 mg | ORAL_TABLET | Freq: Four times a day (QID) | ORAL | Status: DC | PRN
  Filled 2015-02-10: qty 1

## 2015-02-10 MED ORDER — CITRIC ACID-SODIUM CITRATE 334-500 MG/5ML PO SOLN: 30.0000 mL | ORAL | Status: DC | PRN

## 2015-02-10 MED ORDER — ACETAMINOPHEN 325 MG PO TABS
650.0000 mg | ORAL_TABLET | ORAL | Status: DC | PRN
Start: 2015-02-10 — End: 2015-02-11

## 2015-02-10 MED ORDER — MISOPROSTOL 200 MCG PO TABS
1000.0000 ug | ORAL_TABLET | Freq: Once | ORAL | Status: AC
Start: 2015-02-10 — End: 2015-02-10
  Administered 2015-02-10: 1000 ug via RECTAL

## 2015-02-10 MED ORDER — FLEET ENEMA 7-19 GM/118ML RE ENEM: 1.0000 | ENEMA | RECTAL | Status: DC | PRN

## 2015-02-10 MED ORDER — LIDOCAINE HCL (PF) 1 % IJ SOLN
INTRAMUSCULAR | Status: DC | PRN
  Administered 2015-02-10 (×2): 8 mL via EPIDURAL

## 2015-02-10 MED ORDER — OXYTOCIN 40 UNITS IN LACTATED RINGERS INFUSION - SIMPLE MED
62.5000 mL/h | INTRAVENOUS | Status: DC
  Filled 2015-02-10: qty 1000

## 2015-02-10 MED ORDER — LACTATED RINGERS IV SOLN
INTRAVENOUS | Status: DC
  Administered 2015-02-10: 05:00:00 via INTRAVENOUS
  Administered 2015-02-10: 125 mL/h via INTRAVENOUS

## 2015-02-10 MED ORDER — EPHEDRINE 5 MG/ML INJ
10.0000 mg | INTRAVENOUS | Status: DC | PRN
  Filled 2015-02-10: qty 2

## 2015-02-10 MED ORDER — LACTATED RINGERS IV SOLN
500.0000 mL | INTRAVENOUS | Status: DC | PRN
Start: 2015-02-10 — End: 2015-02-11
  Administered 2015-02-10: 500 mL via INTRAVENOUS

## 2015-02-10 MED ORDER — LIDOCAINE HCL (PF) 1 % IJ SOLN
30.0000 mL | INTRAMUSCULAR | Status: DC | PRN
  Filled 2015-02-10: qty 30

## 2015-02-10 MED ORDER — OXYTOCIN 40 UNITS IN LACTATED RINGERS INFUSION - SIMPLE MED
62.5000 mL/h | INTRAVENOUS | Status: DC
  Administered 2015-02-10: 1 mL/h via INTRAVENOUS
  Administered 2015-02-10: 62.5 mL/h via INTRAVENOUS

## 2015-02-10 NOTE — Progress Notes (Signed)
Subjective: Pt resting in bed.  S/p 1 dose nubain, IV medications. Coping with contractions. Indicates contractions are getting stronger.  Inquires when she is able to receive another dose of Iv pain medication.   Pt desires epidural for pain management but does not want it "too soon".   Objective: BP 127/77 mmHg  Pulse 97  Temp(Src) 97.6 F (36.4 C) (Oral)  Resp 16  Ht 5\' 4"  (1.626 m)  Wt 79.833 kg (176 lb)  BMI 30.20 kg/m2  SpO2 97%  LMP 05/15/2014      Filed Vitals:   02/10/15 0709 02/10/15 0714 02/10/15 0730 02/10/15 0801  BP:    127/77  Pulse: 95 99  97  Temp:   97.6 F (36.4 C)   TempSrc:   Oral   Resp:   16   Height:      Weight:      SpO2: 99% 97%      FHT: Category 1, 115 bpm, moderate/mininal variability, +accels, no decels  (s/p nubain abministration) UC:   irregular, every 1-4 minutes SVE:   Dilation: 1 Effacement (%): 70 Station: -1 Exam by:: Lavetta Nielsen, CNM  Pain management: S/p nubain, x2doses   Induction/Augmentation:  Pitocin: none Cytotec:  HOLD due to current contraction pattern  Assessment:  IUP at 38.4 wks Normotensive now, elevated BP on admission PIH labs normal SROM, light meconium x 6 hrs Latent labor Cat 1 FHRT GBS neg Anxiety Elevated BMI (30.3) Allergy to Amoxicillin H/O colitis H/O ADHD Cervix is more favorable   Plan: Discussed plan of care- Expectant management versus augmentation In light of current contraction pattern (irregular 1-4 min, will continue expectant management Pain med/epidural prn PCR via catheter when foley is placed for an epidural Consult prn Expect progress   Alfred, [place] 02/10/2015, 8:51 AM

## 2015-02-10 NOTE — Progress Notes (Signed)
  Subjective: Feeling better. Feeling pressure with contractions.  Objective: BP 126/70 mmHg  Pulse 97  Temp(Src) 97.7 F (36.5 C) (Oral)  Resp 16  Ht 5\' 4"  (1.626 m)  Wt 79.833 kg (176 lb)  BMI 30.20 kg/m2  SpO2 95%  LMP 05/15/2014 I/O last 3 completed shifts: In: -  Out: 300 [Urine:300]    FHR 60-90 bpm x several minutes -- slow to recover, but did so w/ pt in hands/knees position, placement of 02, Pit off, increase in IVFs and dose of Terb.  UC:   regular, every 1 minute SVE: C/C/+1 @ 21:17   FSE applied  Assessment:  Tachysystole Cat 2 FHRT resolved to Cat 1 w/ above measures  Plan: Updated Dr. Cletis Media. Will proceed w/ allowing pt to push. If no descent, will allow passive descent x 1 hr and update Dr. Cletis Media.  Farrel Gordon CNM 02/10/2015, 9:51 PM  ADDENDUM: Updated Dr. Cletis Media that delivery was imminent.  Farrel Gordon, CNM 02/10/15, 10:50 PM

## 2015-02-10 NOTE — Anesthesia Preprocedure Evaluation (Signed)
Anesthesia Evaluation  Patient identified by MRN, date of birth, ID band Patient awake    Reviewed: Allergy & Precautions, H&P , NPO status , Patient's Chart, lab work & pertinent test results  Airway Mallampati: I  TM Distance: >3 FB Neck ROM: full    Dental no notable dental hx.    Pulmonary    Pulmonary exam normal        Cardiovascular negative cardio ROS Normal cardiovascular exam     Neuro/Psych negative neurological ROS     GI/Hepatic negative GI ROS, Neg liver ROS,   Endo/Other  negative endocrine ROS  Renal/GU negative Renal ROS     Musculoskeletal   Abdominal Normal abdominal exam  (+)   Peds  Hematology negative hematology ROS (+)   Anesthesia Other Findings   Reproductive/Obstetrics (+) Pregnancy                             Anesthesia Physical Anesthesia Plan  ASA: II  Anesthesia Plan: Epidural   Post-op Pain Management:    Induction:   Airway Management Planned:   Additional Equipment:   Intra-op Plan:   Post-operative Plan:   Informed Consent: I have reviewed the patients History and Physical, chart, labs and discussed the procedure including the risks, benefits and alternatives for the proposed anesthesia with the patient or authorized representative who has indicated his/her understanding and acceptance.     Plan Discussed with:   Anesthesia Plan Comments:         Anesthesia Quick Evaluation

## 2015-02-10 NOTE — Anesthesia Procedure Notes (Signed)
Epidural Patient location during procedure: OB Start time: 02/10/2015 11:44 AM End time: 02/10/2015 11:48 AM  Staffing Anesthesiologist: Lyn Hollingshead Performed by: anesthesiologist   Preanesthetic Checklist Completed: patient identified, surgical consent, pre-op evaluation, timeout performed, IV checked, risks and benefits discussed and monitors and equipment checked  Epidural Patient position: sitting Prep: site prepped and draped and DuraPrep Patient monitoring: continuous pulse ox and blood pressure Approach: midline Location: L3-L4 Injection technique: LOR air  Needle:  Needle type: Tuohy  Needle gauge: 17 G Needle length: 9 cm and 9 Needle insertion depth: 7 cm Catheter type: closed end flexible Catheter size: 19 Gauge Catheter at skin depth: 12 cm Test dose: negative and Other  Assessment Sensory level: T9 Events: blood not aspirated, injection not painful, no injection resistance, negative IV test and no paresthesia  Additional Notes Reason for block:procedure for pain

## 2015-02-10 NOTE — Progress Notes (Signed)
Subjective: Resting comfortable in bed  s/p epidural placement  Objective: BP 107/66 mmHg  Pulse 93  Temp(Src) 98.1 F (36.7 C) (Oral)  Resp 18  Ht 5\' 4"  (1.626 m)  Wt 79.833 kg (176 lb)  BMI 30.20 kg/m2  SpO2 95%  LMP 05/15/2014       Filed Vitals:   02/10/15 1212 02/10/15 1214 02/10/15 1217 02/10/15 1232  BP: 107/64  104/63 107/66  Pulse: 112 111 99 93  Temp:      TempSrc:      Resp:    18  Height:      Weight:      SpO2:        FHT: Category 1, 130 bpm, moderate variability, +accels, no decels UC:  irregular, every 2-5 minutes, mild SVE:   Dilation: 1.5 Effacement (%): 70 Station: -1 Exam by:: Apolonio Schneiders, Calliope Delangel, cnm Membranes: SROM at 0250 today   Pain Management: Epidural  Augmentation: Pitocin: None  Assessment:  IUP at 38.4 wks SROM, light meconium x 10.5 hrs Latent labor Inadequate contractions Cat 1 FHRT GBS neg Anxiety Elevated BMI (30.3) Allergy to Amoxicillin   Plan: Begin Pitocin, 1x2, per protocol R&B of augmentation with pitocin discussed including risk of tachysystole, potential need for further intervention, and  risk of C/S.  Patient and FOB seem to understand these risks and are agreeable with proceeding with pitocin  Dr. Alesia Richards aware and available   Holly Thornton CNM, [place] 02/10/2015, 1:08 PM

## 2015-02-10 NOTE — Progress Notes (Signed)
Subjective: Comfortable with epidural  Objective: BP 99/63 mmHg  Pulse 92  Temp(Src) 97.7 F (36.5 C) (Oral)  Resp 16  Ht 5\' 4"  (1.626 m)  Wt 79.833 kg (176 lb)  BMI 30.20 kg/m2  SpO2 95%  LMP 05/15/2014   Total I/O In: -  Out: 300 [Urine:300]  FHT: Category 1 , 130 bpm, moderate variability, +accels, early decelerations UC:   regular, every 2-2.5 minutes SVE:   Dilation: 3.5 Effacement (%): 90 Station: -1 Exam by:: Lavetta Nielsen, CNM Membranes: SROM at   Augmentation ; Pitocin at 10 milliunits/min  Assessment:  IUP at 38.4 wks SROM, light meconium x 16 hrs Latent/Early labor Inadequate contractions Cat 1 FHRT GBS neg Anxiety Elevated BMI (30.3) Allergy to Amoxicillin  Plan: Continue current plan of care  Ken Caryl, [place] 02/10/2015, 6:32 PM

## 2015-02-10 NOTE — MAU Note (Signed)
Pt states water broke at 0250-clear fluid. Contracting every 10 mins. +FM. Denies vag bleeding.

## 2015-02-10 NOTE — Progress Notes (Signed)
Assuming care. Family at bedside.  Subjective: Vomiting, yet o/w comfortable.  Objective: BP 115/68 mmHg  Pulse 100  Temp(Src) 97.7 F (36.5 C) (Oral)  Resp 16  Ht 5\' 4"  (1.626 m)  Wt 79.833 kg (176 lb)  BMI 30.20 kg/m2  SpO2 95%  LMP 05/15/2014 I/O last 3 completed shifts: In: -  Out: 300 [Urine:300]    FHT: BL 130 w/ min-mod variability, +accels, occ late, repetitive variables in spite of intrauterine resuscitative measures UC:   irregular, every 1-6 minutes, intermittent coupling - MVUs 195 SVE:   Dilation: 9 Effacement (%): 100 Station: 0, +1 Exam by:: D Jasso, RN Pitocin at 12 mU/min  Assessment:  IUP at 38.4 wks SROM since 0250, light mec Cat 2 FHRT Active labor Adequate MVUs  Plan: In light of repetitive variables, will begin amnioinfusion Will also proceed w/ IVF bolus and other resuscitative measures as indicated Consult prn   Farrel Gordon CNM 02/10/2015, 8:59 PM

## 2015-02-10 NOTE — Progress Notes (Signed)
Farrel Gordon, CNM stated that she checked the patient in the Maternal Admissions Unit. She stated that she was a tight 1cm, thick, and vertex.

## 2015-02-10 NOTE — H&P (Signed)
Holly Thornton is a 24 y.o. female, G1P0 at 38.4 weeks, presenting for SROM, light meconium at 2:50 am today. +FM and ctxs q 10 min. Denies bleeding. Elevated BP on arrival. Denies h/a, visual disturbances, RUQ pain, CP, SOB or severe N/V.  Patient Active Problem List   Diagnosis Date Noted  . Normal labor 02/10/2015  . Obesity (BMI 30-39.9) 02/10/2015  . History of colitis 02/10/2015  . History of frequent urinary tract infections 02/10/2015  . Allergy to drug - Amoxicillin - anaphylaxis and swelling 02/10/2015  . 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 ADHD 03/01/2009    History of present pregnancy: Patient entered care at 11 weeks.   EDC of 02/20/15 was established by sure LMP.   Anatomy scan: 20 weeks, with normal findings and a posterior placenta.   Additional [place] evaluations: None. Significant prenatal events: Unplanned pregnancy but desired; supported by FOB and MGM. Common discomforts of pregnancy in all trimesters. Struggled w/ allergy sxs and sinus pressure. Seen in MAU for c/o LOF (neg); + yeast infection instead - treated. Evaluated at [facility] for dog bite. Rapid wt gain. TWG: 55.5 lbs. Received both flu and Tdap. GBS neg. Last evaluation: Office on 02/08/15 @ 38.2 wks by SDJ, CNM. Cvx 0/40%/-3. BP 116/72. Wt: 176.5 lbs. Treated for yeast.  OB History    Gravida Para Term Preterm AB TAB SAB Ectopic Multiple Living   1 0        0     Past Medical History  Diagnosis Date  . Colitis     at age 28  . Asthma     child  . Herpes genitalis in women    Past Surgical History  Procedure Laterality Date  . No past surgeries     Family History: family history includes Anemia in her mother; Depression in her mother; Diabetes in her maternal grandmother; Fibroids in her mother.HTN and acute leukemia in her MGM and dementia in her PGM. Social History:  reports that she has never  smoked. She has never used smokeless tobacco. She reports that she drinks about 0.5 oz of alcohol per week. She reports that she does not use illicit drugs. 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 She will accept blood in an emergency.    Prenatal Transfer Tool  Maternal Diabetes: No Genetic Screening: Normal first trimester and AFP Maternal Ultrasounds/Referrals: Normal Fetal Ultrasounds or other Referrals:  None Maternal Substance Abuse:  No Significant Maternal Medications:  Meds include: Other: PNVs, Terconazole 0.4% vaginal cream, Tyl prn Significant Maternal Lab Results: Lab values include: Group B Strep negative  TDAP: 11/25/14 Flu: 01/09/15  ROS: 10 Systems reviewed and are negative for acute change except as noted in the HPI.   Allergies  Allergen Reactions  . Amoxicillin Anaphylaxis and Swelling  . Peanut-Containing Drug Products Anaphylaxis, Hives, Diarrhea, Rash and Other (See Comments)    Puffiness of the eys      Blood pressure 132/77, pulse 103, temperature 98.4 F (36.9 C), temperature source Oral, resp. rate 16, height 5\' 4"  (1.626 m), weight 79.833 kg (176 lb), last menstrual period 05/15/2014, SpO2 96 %. Gen: Anxious Chest clear Heart RRR without murmur Abd gravid, NT, FH CWD Pelvic: [id number], cephalic Ext: 2+ DTRs bilaterally, no clonus EFW: 7 lbs; cephalic FHR: Category 1 UCs:  q 4-5 min  Prenatal labs: ABO, Rh: --/--/O POS (11/25 [id number]) Antibody: NEG (11/25 0438) Rubella: Immune (07/14/14) RPR: Nonreactive (04/28 0000)  HBsAg: Negative (04/28 0000)  HIV: Non-reactive (04/28 0000)  GBS: Negative (11/09 0000) Sickle cell/Hgb electrophoresis: AA - normal study Pap: Normal (07/23/13) GC: Neg (07/14/14) Chlamydia: Neg (07/14/14) Genetic screenings: Neg first trimester on 08/16/14 and neg AFP on 09/13/14 Glucola: Normal  at 80   Hgb 13.9 on 06/29/14, 14.4 on 07/10/14 and 02/22/15 on 07/14/14 at [facility], 11.2 at 28 weeks  Assessment/Plan: IUP at 38.4 wks Elevated BP on admission SROM, light meconium Latent labor Cat 1 FHRT GBS neg Anxiety Elevated BMI (30.3) Allergy to Amoxicillin H/O colitis H/O ADHD Unfavorable cvx  Plan: Admit to [facility] Routine [facility] orders. PreE labs + PCR w/ admission labs Pain med/epidural prn Reviewed R&B of augmentation with patient and FOB, including need for serial induction, risk of C/S, need for further intervention. Patient and FOB seem to understand these risks and are agreeable with proceeding if need be.  Reviewed options of augmentation to include po Cytotec and Pitocin. In light of unfavorable cvx, will begin with po Cytotec as warranted as pt is already contracting.  Consult prn Expect progress   Laurey Morale, MS 02/10/2015, 6:07 AM

## 2015-02-11 ENCOUNTER — Encounter (HOSPITAL_COMMUNITY): Payer: Self-pay | Admitting: Anesthesiology

## 2015-02-11 ENCOUNTER — Encounter (HOSPITAL_COMMUNITY): Payer: Self-pay | Admitting: *Deleted

## 2015-02-11 LAB — CBC
HEMATOCRIT: 27.9 % — AB (ref 36.0–46.0)
Hemoglobin: 9.3 g/dL — ABNORMAL LOW (ref 12.0–15.0)
MCH: 27.8 pg (ref 26.0–34.0)
MCHC: 33.3 g/dL (ref 30.0–36.0)
MCV: 83.5 fL (ref 78.0–100.0)
PLATELETS: 164 10*3/uL (ref 150–400)
RBC: 3.34 MIL/uL — AB (ref 3.87–5.11)
RDW: 14.8 % (ref 11.5–15.5)
WBC: 17.5 10*3/uL — AB (ref 4.0–10.5)

## 2015-02-11 MED ORDER — TETANUS-DIPHTH-ACELL PERTUSSIS 5-2.5-18.5 LF-MCG/0.5 IM SUSP: 0.5000 mL | Freq: Once | INTRAMUSCULAR | Status: DC

## 2015-02-11 MED ORDER — LANOLIN HYDROUS EX OINT: TOPICAL_OINTMENT | CUTANEOUS | Status: DC | PRN

## 2015-02-11 MED ORDER — ONDANSETRON HCL 4 MG/2ML IJ SOLN: 4.0000 mg | INTRAMUSCULAR | Status: DC | PRN

## 2015-02-11 MED ORDER — IBUPROFEN 600 MG PO TABS
600.0000 mg | ORAL_TABLET | Freq: Four times a day (QID) | ORAL | Status: DC
  Administered 2015-02-11 – 2015-02-12 (×7): 600 mg via ORAL
  Filled 2015-02-11 (×7): qty 1

## 2015-02-11 MED ORDER — WITCH HAZEL-GLYCERIN EX PADS: 1.0000 "application " | MEDICATED_PAD | CUTANEOUS | Status: DC | PRN

## 2015-02-11 MED ORDER — OXYCODONE-ACETAMINOPHEN 5-325 MG PO TABS
1.0000 | ORAL_TABLET | ORAL | Status: DC | PRN
  Filled 2015-02-11: qty 1

## 2015-02-11 MED ORDER — SIMETHICONE 80 MG PO CHEW: 80.0000 mg | CHEWABLE_TABLET | ORAL | Status: DC | PRN

## 2015-02-11 MED ORDER — PRENATAL MULTIVITAMIN CH
1.0000 | ORAL_TABLET | Freq: Every day | ORAL | Status: DC
  Administered 2015-02-11 – 2015-02-12 (×2): 1 via ORAL
  Filled 2015-02-11 (×2): qty 1

## 2015-02-11 MED ORDER — DIPHENHYDRAMINE HCL 25 MG PO CAPS: 25.0000 mg | ORAL_CAPSULE | Freq: Four times a day (QID) | ORAL | Status: DC | PRN

## 2015-02-11 MED ORDER — FERROUS SULFATE 325 (65 FE) MG PO TABS
325.0000 mg | ORAL_TABLET | Freq: Two times a day (BID) | ORAL | Status: DC
  Administered 2015-02-11 – 2015-02-12 (×2): 325 mg via ORAL
  Filled 2015-02-11 (×2): qty 1

## 2015-02-11 MED ORDER — ONDANSETRON HCL 4 MG PO TABS: 4.0000 mg | ORAL_TABLET | ORAL | Status: DC | PRN

## 2015-02-11 MED ORDER — ACETAMINOPHEN 325 MG PO TABS
650.0000 mg | ORAL_TABLET | ORAL | Status: DC | PRN
  Administered 2015-02-11 – 2015-02-12 (×2): 650 mg via ORAL
  Filled 2015-02-11 (×2): qty 2

## 2015-02-11 MED ORDER — METHYLERGONOVINE MALEATE 0.2 MG PO TABS: 0.2000 mg | ORAL_TABLET | ORAL | Status: DC | PRN

## 2015-02-11 MED ORDER — DIBUCAINE 1 % RE OINT: 1.0000 "application " | TOPICAL_OINTMENT | RECTAL | Status: DC | PRN

## 2015-02-11 MED ORDER — ZOLPIDEM TARTRATE 5 MG PO TABS: 5.0000 mg | ORAL_TABLET | Freq: Every evening | ORAL | Status: DC | PRN

## 2015-02-11 MED ORDER — METHYLERGONOVINE MALEATE 0.2 MG/ML IJ SOLN: 0.2000 mg | INTRAMUSCULAR | Status: DC | PRN

## 2015-02-11 MED ORDER — OXYCODONE-ACETAMINOPHEN 5-325 MG PO TABS: 2.0000 | ORAL_TABLET | ORAL | Status: DC | PRN

## 2015-02-11 MED ORDER — BENZOCAINE-MENTHOL 20-0.5 % EX AERO
1.0000 "application " | INHALATION_SPRAY | CUTANEOUS | Status: DC | PRN
  Administered 2015-02-11: 1 via TOPICAL
  Filled 2015-02-11: qty 56

## 2015-02-11 MED ORDER — SENNOSIDES-DOCUSATE SODIUM 8.6-50 MG PO TABS
2.0000 | ORAL_TABLET | ORAL | Status: DC
  Filled 2015-02-11: qty 2

## 2015-02-11 NOTE — Anesthesia Postprocedure Evaluation (Signed)
Anesthesia Post Note  Patient: Holly Thornton  Procedure(s) Performed: * No procedures listed *  Patient location during evaluation: Mother Baby Anesthesia Type: Epidural Level of consciousness: awake and alert and oriented Pain management: pain level controlled Vital Signs Assessment: post-procedure vital signs reviewed and stable Cardiovascular status: blood pressure returned to baseline Postop Assessment: No headache, No backache, Adequate PO intake, No signs of nausea or vomiting and Patient able to bend at knees Anesthetic complications: no    Last Vitals:  Filed Vitals:   02/11/15 0230 02/11/15 0631  BP: 117/73 117/69  Pulse: 93 94  Temp: 36.8 C 36.8 C  Resp: 18 18    Last Pain:  Filed Vitals:   02/11/15 0631  PainSc: 0-No pain                 Pacey Willadsen

## 2015-02-11 NOTE — Lactation Note (Signed)
This note was copied from the chart of Holly Optician, dispensing. Lactation Consultation Note New mom states baby has been sleepy, has been mainly licking breast and not actually sucking. Looking at breast nipples are flat. Areolas and nipples are very compressible and w/stimulation and rolling nipple everts to short shaft for latch. Concerned if breast become engorged or filling, will not compress for latch. Shells given to wear between BF in bra. Hand pump given to evert nipples as well. Mom can express colostrum.  Mom encouraged to feed baby 8-12 times/24 hours and with feeding cues. Mom encouraged to do skin-to-skin. Educated about newborn behavior, I&O, positioning, cluster feeding, supply and demand. Referred to Baby and Me Book in Breastfeeding section Pg. 22-23 for position options and Proper latch demonstration. [place] brochure given w/resources, support groups and [place] services. Patient Name: Holly Thornton [id number] Date: 02/11/2015 Reason for consult: Initial assessment   Maternal Data Has patient been taught Hand Expression?: Yes Does the patient have breastfeeding experience prior to this delivery?: No  Feeding Feeding Type: Breast Fed Length of feed: 15 min  LATCH Score/Interventions Latch: Repeated attempts needed to sustain latch, nipple held in mouth throughout feeding, stimulation needed to elicit sucking reflex. Intervention(s): Skin to skin;Teach feeding cues;Waking techniques Intervention(s): Adjust position;Assist with latch;Breast massage;Breast compression  Audible Swallowing: Spontaneous and intermittent Intervention(s): Hand expression;Skin to skin  Type of Nipple: Everted at rest and after stimulation (semi flat/nipple compressible)  Comfort (Breast/Nipple): Soft / non-tender     Hold (Positioning): Assistance needed to correctly position infant at breast and maintain latch. Intervention(s): Skin to skin;Position options;Support Pillows;Breastfeeding basics  reviewed  LATCH Score: 8  Lactation Tools Discussed/Used Tools: Shells;Pump Shell Type: Inverted Breast pump type: Manual WIC Program: Yes Pump Review: Setup, frequency, and cleaning;Milk Storage Initiated by:: Allayne Stack RN Date initiated:: 02/11/15   Consult Status Consult Status: Follow-up Date: 02/12/15 Follow-up type: In-patient    Theodoro Kalata 02/11/2015, 12:04 PM

## 2015-02-11 NOTE — Progress Notes (Signed)
Acknowledged order for social work consult for history of anxiety   Met briefly with MOB, and informed her of reason for consult.  She was surprised and immediately stated that she has no hx of anxiety.  She also denies any hx of mental illness.  She denies any current symptoms of depression or anxiety.  Mother reports having an excellent support system.  Multiple visitor present when CSW entered the room.   CSW did not complete full assessment since MOB stated that it was not needed.  Contact CSW if needs arise or upon MOB request.

## 2015-02-11 NOTE — Discharge Summary (Signed)
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 yo G1P0 at [redacted]w[redacted]d was admitted in Latent Laboron 02/10/2015. Patient had an uncomplicated labor course as follows:  Membrane Rupture Time/Date: 2:50 AM ,02/10/2015   Intrapartum Procedures: Episiotomy: None [1]                                         Lacerations:  None [1]  Patient had a delivery of a Viable infant. 02/10/2015  Information for the patient's newborn:  Lurena Nida Girl [employer] [id number]  Delivery Method: Vaginal, Spontaneous Delivery (Filed from Delivery Summary)    Pateint had an uncomplicated postpartum course.  She is ambulating, tolerating a regular diet, passing flatus, and urinating well. Patient is discharged home in stable condition on No discharge date for patient encounter.Marland Kitchen    Physical exam  Filed Vitals:   02/11/15 0032 02/11/15 0115 02/11/15 0230 02/11/15 0631  BP: 121/77 116/75 117/73 117/69  Pulse: 115 105 93 94  Temp:  98.4 F (36.9 C) 98.3 F (36.8 C) 98.2 F (36.8 C)  TempSrc:      Resp:  18 18 18   Height:      Weight:      SpO2:        General: alert and cooperative Lochia: appropriate Uterine Fundus: firm Incision: Skid marks DVT Evaluation: No evidence of DVT seen on physical exam. Labs: Lab Results  Component Value Date   WBC 17.5* 02/11/2015   HGB 9.3* 02/11/2015   HCT 27.9* 02/11/2015   MCV 83.5 02/11/2015   PLT 164 02/11/2015   CMP Latest Ref Rng 02/10/2015  Glucose 65 - 99 mg/dL 90  BUN 6 - 20 mg/dL 9  Creatinine 0.44 - 1.00 mg/dL 0.53  Sodium 135 - 145 mmol/L 134(L)  Potassium 3.5 - 5.1 mmol/L 4.1  Chloride 101 - 111 mmol/L 103  CO2 22 - 32 mmol/L 22  Calcium 8.9 - 10.3 mg/dL 10.8(H)  Total Protein 6.5 - 8.1 g/dL 7.4  Total Bilirubin 0.3 - 1.2 mg/dL 0.5  Alkaline Phos 38 - 126 U/L 203(H)  AST 15 - 41 U/L 25  ALT 14 - 54 U/L 18    Discharge instruction: per After Visit Summary and "Baby and Me  Booklet".  Medications:  Current facility-administered medications:  .  acetaminophen (TYLENOL) tablet 650 mg, 650 mg, Oral, Q4H PRN, Farrel Gordon, CNM .  benzocaine-Menthol (DERMOPLAST) 20-0.5 % topical spray 1 application, 1 application, Topical, PRN, Farrel Gordon, CNM, 1 application at [id number] .  witch hazel-glycerin (TUCKS) pad 1 application, 1 application, Topical, PRN **AND** dibucaine (NUPERCAINAL) 1 % rectal ointment 1 application, 1 application, Rectal, PRN, Farrel Gordon, CNM .  diphenhydrAMINE (BENADRYL) capsule 25 mg, 25 mg, Oral, Q6H PRN, Farrel Gordon, CNM .  ferrous sulfate tablet 325 mg, 325 mg, Oral, BID WC, Farrel Gordon, CNM .  ibuprofen (ADVIL,MOTRIN) tablet 600 mg, 600 mg, Oral, 4 times per day, Farrel Gordon, CNM, 600 mg at 02/11/15 [id number] .  lanolin ointment, , Topical, PRN, Farrel Gordon, CNM .  methylergonovine (METHERGINE) tablet 0.2 mg, 0.2 mg, Oral, Q4H PRN **OR** methylergonovine (METHERGINE) injection 0.2 mg, 0.2 mg, Intramuscular, Q4H PRN, Farrel Gordon, CNM .  ondansetron (ZOFRAN) tablet 4 mg, 4 mg, Oral, Q4H PRN **OR** ondansetron  (ZOFRAN) injection 4 mg, 4 mg, Intravenous, Q4H PRN, Farrel Gordon, CNM .  oxyCODONE-acetaminophen (PERCOCET/ROXICET) 5-325 MG per tablet 1 tablet, 1 tablet, Oral, Q4H PRN, Farrel Gordon, CNM .  oxyCODONE-acetaminophen (PERCOCET/ROXICET) 5-325 MG per tablet 2 tablet, 2 tablet, Oral, Q4H PRN, Farrel Gordon, CNM .  prenatal multivitamin tablet 1 tablet, 1 tablet, Oral, Q1200, Farrel Gordon, CNM .  senna-docusate (Senokot-S) tablet 2 tablet, 2 tablet, Oral, Q24H, Farrel Gordon, CNM .  simethicone (MYLICON) chewable tablet 80 mg, 80 mg, Oral, PRN, Farrel Gordon, CNM .  Tdap (BOOSTRIX) injection 0.5 mL, 0.5 mL, Intramuscular, Once, Farrel Gordon, CNM .  zolpidem (AMBIEN) tablet 5 mg, 5 mg, Oral, QHS PRN, Farrel Gordon, CNM  Facility-Administered Medications Ordered in Other Encounters:  .  lidocaine (PF) (XYLOCAINE) 1 % injection, , , Anesthesia Intra-op, Lyn Hollingshead, MD, 8 mL at 02/10/15 1148 After Visit Meds:    Medication List    ASK your doctor about these medications        acetaminophen 500 MG tablet  Commonly known as:  TYLENOL  Take 1,000 mg by mouth every 6 (six) hours as needed for headache.     calcium carbonate 500 MG chewable tablet  Commonly known as:  TUMS - dosed in mg elemental calcium  Chew 2 tablets by mouth daily as needed for indigestion or heartburn.     [employer] PRENATAL GUMMY 0.4-113.5 MG Chew  Chew 2 each by mouth daily.     ondansetron 4 MG tablet  Commonly known as:  ZOFRAN  Take 1 tablet (4 mg total) by mouth every 8 (eight) hours as needed for nausea or vomiting.        Diet: routine diet  Activity: Advance as tolerated. Pelvic rest for 6 weeks.   Outpatient follow up:6 weeks Follow up Appt:No future appointments. Follow up visit: No Follow-up on file.  Postpartum contraception: Undecided  Newborn Data: Live born female  Birth Weight: 6 lb 15.1 oz (3150 g) APGAR: 8, 9  Baby Feeding: Breast Disposition:home  with mother   02/11/2015 Jaquawn Saffran, CNM   All information confirmed prior to discharge   Postpartum Care After Vaginal Delivery  After you deliver your newborn (postpartum period), the usual stay in the hospital is 24 72 hours. If there were problems with your labor or delivery, or if you have other medical problems, you might be in the hospital longer.  While you are in the hospital, you will receive help and instructions  on how to care for yourself and your newborn during the postpartum period.  While you are in the hospital:  Be sure to tell your nurses if you have pain or discomfort, as well as where you feel the pain and what makes the pain worse.  If you had an incision made near your vagina (episiotomy) or if you had some tearing during delivery, the nurses may put ice packs on your episiotomy or tear. The ice packs may help to reduce the pain and swelling.  If you are breastfeeding, you may feel uncomfortable contractions of your uterus for a couple of weeks. This is normal. The contractions help your uterus get back to normal size.  It is normal to have some bleeding after delivery.  For the first 1 3 days after delivery, the flow is red and the amount may be similar to a period.  It is common for the flow to start and stop.  In the first few days, you may pass some small clots. Let your nurses know if you begin to pass large clots or your flow increases.  Do not  flush blood clots down the toilet before having the nurse look at them.  During the next 3 10 days after delivery, your flow should become more watery and pink or brown-tinged in color.  Ten to fourteen days after delivery, your flow should be a small amount of yellowish-white discharge.  The amount of your flow will decrease over the first few weeks after delivery. Your flow may stop in 6 8 weeks. Most women have had their flow stop by 12 weeks after delivery.  You should change your sanitary pads  frequently.  Wash your hands thoroughly with soap and water for at least 20 seconds after changing pads, using the toilet, or before holding or feeding your newborn.  You should feel like you need to empty your bladder within the first 6 8 hours after delivery.  In case you become weak, lightheaded, or faint, call your nurse before you get out of bed for the first time and before you take a shower for the first time.  Within the first few days after delivery, your breasts may begin to feel tender and full. This is called engorgement. Breast tenderness usually goes away within 48 72 hours after engorgement occurs. You may also notice milk leaking from your breasts. If you are not breastfeeding, do not stimulate your breasts. Breast stimulation can make your breasts produce more milk.  Spending as much time as possible with your newborn is very important. During this time, you and your newborn can feel close and get to know each other. Having your newborn stay in your room (rooming in) will help to strengthen the bond with your newborn. It will give you time to get to know your newborn and become comfortable caring for your newborn.  Your hormones change after delivery. Sometimes the hormone changes can temporarily cause you to feel sad or tearful. These feelings should not last more than a few days. If these feelings last longer than that, you should talk to your caregiver.  If desired, talk to your caregiver about methods of family planning or contraception.  Talk to your caregiver about immunizations. Your caregiver may want you to have the following immunizations before leaving the hospital:  Tetanus, diphtheria, and pertussis (Tdap) or tetanus and diphtheria (Td) immunization. It is very important that you and your family (including grandparents) or others caring for your newborn are up-to-date  with the Tdap or Td immunizations. The Tdap or Td immunization can help protect your newborn from  getting ill.  Rubella immunization.  Varicella (chickenpox) immunization.  Influenza immunization. You should receive this annual immunization if you did not receive the immunization during your pregnancy. Document Released: 12/30/2006 Document Revised: 11/27/2011 Document Reviewed: 10/30/2011 [facility] Patient Information 2014 [place].   Postpartum Depression and Baby Blues  The postpartum period begins right after the birth of a baby. During this time, there is often a great amount of joy and excitement. It is also a time of considerable changes in the life of the parent(s). Regardless of how many times a mother gives birth, each child brings new challenges and dynamics to the family. It is not unusual to have feelings of excitement accompanied by confusing shifts in moods, emotions, and thoughts. All mothers are at risk of developing postpartum depression or the "baby blues." These mood changes can occur right after giving birth, or they may occur many months after giving birth. The baby blues or postpartum depression can be mild or severe. Additionally, postpartum depression can resolve rather quickly, or it can be a long-term condition. CAUSES Elevated hormones and their rapid decline are thought to be a main cause of postpartum depression and the baby blues. There are a number of hormones that radically change during and after pregnancy. Estrogen and progesterone usually decrease immediately after delivering your baby. The level of thyroid hormone and various cortisol steroids also rapidly drop. Other factors that play a major role in these changes include major life events and genetics.  RISK FACTORS If you have any of the following risks for the baby blues or postpartum depression, know what symptoms to watch out for during the postpartum period. Risk factors that may increase the likelihood of getting the baby blues or postpartum depression include: 1. Havinga personal or family  history of depression. 2. Having depression while being pregnant. 3. Having premenstrual or oral contraceptive-associated mood issues. 4. Having exceptional life stress. 5. Having marital conflict. 6. Lacking a social support network. 7. Having a baby with special needs. 8. Having health problems such as diabetes. SYMPTOMS Baby blues symptoms include:  Brief fluctuations in mood, such as going from extreme happiness to sadness.  Decreased concentration.  Difficulty sleeping.  Crying spells, tearfulness.  Irritability.  Anxiety. Postpartum depression symptoms typically begin within the first month after giving birth. These symptoms include:  Difficulty sleeping or excessive sleepiness.  Marked weight loss.  Agitation.  Feelings of worthlessness.  Lack of interest in activity or food. Postpartum psychosis is a very concerning condition and can be dangerous. Fortunately, it is rare. Displaying any of the following symptoms is cause for immediate medical attention. Postpartum psychosis symptoms include:  Hallucinations and delusions.  Bizarre or disorganized behavior.  Confusion or disorientation. DIAGNOSIS  A diagnosis is made by an evaluation of your symptoms. There are no medical or lab tests that lead to a diagnosis, but there are various questionnaires that a caregiver may use to identify those with the baby blues, postpartum depression, or psychosis. Often times, a screening tool called the [place] Postnatal Depression Scale is used to diagnose depression in the postpartum period.  TREATMENT The baby blues usually goes away on its own in 1 to 2 weeks. Social support is often all that is needed. You should be encouraged to get adequate sleep and rest. Occasionally, you may be given medicines to help you sleep.  Postpartum depression requires treatment as it  can last several months or longer if it is not treated. Treatment may include individual or group therapy,  medicine, or both to address any social, physiological, and psychological factors that may play a role in the depression. Regular exercise, a healthy diet, rest, and social support may also be strongly recommended.  Postpartum psychosis is more serious and needs treatment right away. Hospitalization is often needed. HOME CARE INSTRUCTIONS  Get as much rest as you can. Nap when the baby sleeps.  Exercise regularly. Some women find yoga and walking to be beneficial.  Eat a balanced and nourishing diet.  Do little things that you enjoy. Have a cup of tea, take a bubble bath, read your favorite magazine, or listen to your favorite music.  Avoid alcohol.  Ask for help with household chores, cooking, grocery shopping, or running errands as needed. Do not try to do everything.  Talk to people close to you about how you are feeling. Get support from your partner, family members, friends, or other new moms.  Try to stay positive in how you think. Think about the things you are grateful for.  Do not spend a lot of time alone.  Only take medicine as directed by your caregiver.  Keep all your postpartum appointments.  Let your caregiver know if you have any concerns. SEEK MEDICAL CARE IF: You are having a reaction or problems with your medicine. SEEK IMMEDIATE MEDICAL CARE IF:  You have suicidal feelings.  You feel you may harm the baby or someone else. Document Released: 12/07/2003 Document Revised: 05/27/2011 Document Reviewed: 01/08/2011 [facility] Patient Information 2014 [place], [place].     Breastfeeding Deciding to breastfeed is one of the best choices you can make for you and your baby. A change in hormones during pregnancy causes your breast tissue to grow and increases the number and size of your milk ducts. These hormones also allow proteins, sugars, and fats from your blood supply to make breast milk in your milk-producing glands. Hormones prevent breast milk from being  released before your baby is born as well as prompt milk flow after birth. Once breastfeeding has begun, thoughts of your baby, as well as his or her sucking or crying, can stimulate the release of milk from your milk-producing glands.  BENEFITS OF BREASTFEEDING For Your Baby  Your first milk (colostrum) helps your baby's digestive system function better.   There are antibodies in your milk that help your baby fight off infections.   Your baby has a lower incidence of asthma, allergies, and sudden infant death syndrome.   The nutrients in breast milk are better for your baby than infant formulas and are designed uniquely for your baby's needs.   Breast milk improves your baby's brain development.   Your baby is less likely to develop other conditions, such as childhood obesity, asthma, or type 2 diabetes mellitus.  For You   Breastfeeding helps to create a very special bond between you and your baby.   Breastfeeding is convenient. Breast milk is always available at the correct temperature and costs nothing.   Breastfeeding helps to burn calories and helps you lose the weight gained during pregnancy.   Breastfeeding makes your uterus contract to its prepregnancy size faster and slows bleeding (lochia) after you give birth.   Breastfeeding helps to lower your risk of developing type 2 diabetes mellitus, osteoporosis, and breast or ovarian cancer later in life. SIGNS THAT YOUR BABY IS HUNGRY Early Signs of Hunger  Increased alertness or  activity.  Stretching.  Movement of the head from side to side.  Movement of the head and opening of the mouth when the corner of the mouth or cheek is stroked (rooting).  Increased sucking sounds, smacking lips, cooing, sighing, or squeaking.  Hand-to-mouth movements.  Increased sucking of fingers or hands. Late Signs of Hunger  Fussing.  Intermittent crying. Extreme Signs of Hunger Signs of extreme hunger will require calming  and consoling before your baby will be able to breastfeed successfully. Do not wait for the following signs of extreme hunger to occur before you initiate breastfeeding:   Restlessness.  A loud, strong cry.   Screaming.   BREASTFEEDING BASICS Breastfeeding Initiation  Find a comfortable place to sit or lie down, with your neck and back well supported.  Place a pillow or rolled up blanket under your baby to bring him or her to the level of your breast (if you are seated). Nursing pillows are specially designed to help support your arms and your baby while you breastfeed.  Make sure that your baby's abdomen is facing your abdomen.   Gently massage your breast. With your fingertips, massage from your chest wall toward your nipple in a circular motion. This encourages milk flow. You may need to continue this action during the feeding if your milk flows slowly.  Support your breast with 4 fingers underneath and your thumb above your nipple. Make sure your fingers are well away from your nipple and your baby's mouth.   Stroke your baby's lips gently with your finger or nipple.   When your baby's mouth is open wide enough, quickly bring your baby to your breast, placing your entire nipple and as much of the colored area around your nipple (areola) as possible into your baby's mouth.   More areola should be visible above your baby's upper lip than below the lower lip.   Your baby's tongue should be between his or her lower gum and your breast.   Ensure that your baby's mouth is correctly positioned around your nipple (latched). Your baby's lips should create a seal on your breast and be turned out (everted).  It is common for your baby to suck about 2-3 minutes in order to start the flow of breast milk. Latching Teaching your baby how to latch on to your breast properly is very important. An improper latch can cause nipple pain and decreased milk supply for you and poor weight gain  in your baby. Also, if your baby is not latched onto your nipple properly, he or she may swallow some air during feeding. This can make your baby fussy. Burping your baby when you switch breasts during the feeding can help to get rid of the air. However, teaching your baby to latch on properly is still the best way to prevent fussiness from swallowing air while breastfeeding. Signs that your baby has successfully latched on to your nipple:    Silent tugging or silent sucking, without causing you pain.   Swallowing heard between every 3-4 sucks.    Muscle movement above and in front of his or her ears while sucking.  Signs that your baby has not successfully latched on to nipple:   Sucking sounds or smacking sounds from your baby while breastfeeding.  Nipple pain. If you think your baby has not latched on correctly, slip your finger into the corner of your baby's mouth to break the suction and place it between your baby's gums. Attempt breastfeeding initiation again. Signs  of Successful Breastfeeding Signs from your baby:   A gradual decrease in the number of sucks or complete cessation of sucking.   Falling asleep.   Relaxation of his or her body.   Retention of a small amount of milk in his or her mouth.   Letting go of your breast by himself or herself. Signs from you:  Breasts that have increased in firmness, weight, and size 1-3 hours after feeding.   Breasts that are softer immediately after breastfeeding.  Increased milk volume, as well as a change in milk consistency and color by the fifth day of breastfeeding.   Nipples that are not sore, cracked, or bleeding. Signs That Your Randel Books is Getting Enough Milk  Wetting at least 3 diapers in a 24-hour period. The urine should be clear and pale yellow by age 24 days.  At least 3 stools in a 24-hour period by age 24 days. The stool should be soft and yellow.  At least 3 stools in a 24-hour period by age 70 days. The  stool should be seedy and yellow.  No loss of weight greater than 10% of birth weight during the first 21 days of age.  Average weight gain of 4-7 ounces (113-198 g) per week after age 40 days.  Consistent daily weight gain by age 16 days, without weight loss after the age of 2 weeks. After a feeding, your baby may spit up a small amount. This is common. BREASTFEEDING FREQUENCY AND DURATION Frequent feeding will help you make more milk and can prevent sore nipples and breast engorgement. Breastfeed when you feel the need to reduce the fullness of your breasts or when your baby shows signs of hunger. This is called "breastfeeding on demand." Avoid introducing a pacifier to your baby while you are working to establish breastfeeding (the first 4-6 weeks after your baby is born). After this time you may choose to use a pacifier. Research has shown that pacifier use during the first year of a baby's life decreases the risk of sudden infant death syndrome (SIDS). Allow your baby to feed on each breast as long as he or she wants. Breastfeed until your baby is finished feeding. When your baby unlatches or falls asleep while feeding from the first breast, offer the second breast. Because newborns are often sleepy in the first few weeks of life, you may need to awaken your baby to get him or her to feed. Breastfeeding times will vary from baby to baby. However, the following rules can serve as a guide to help you ensure that your baby is properly fed:  Newborns (babies 59 weeks of age or younger) may breastfeed every 1-3 hours.  Newborns should not go longer than 3 hours during the day or 5 hours during the night without breastfeeding.  You should breastfeed your baby a minimum of 8 times in a 24-hour period until you begin to introduce solid foods to your baby at around 42 months of age. BREAST MILK PUMPING Pumping and storing breast milk allows you to ensure that your baby is exclusively fed your breast milk,  even at times when you are unable to breastfeed. This is especially important if you are going back to work while you are still breastfeeding or when you are not able to be present during feedings. Your lactation consultant can give you guidelines on how long it is safe to store breast milk.  A breast pump is a machine that allows you to pump milk from  your breast into a sterile bottle. The pumped breast milk can then be stored in a refrigerator or freezer. Some breast pumps are operated by hand, while others use electricity. Ask your lactation consultant which type will work best for you. Breast pumps can be purchased, but some hospitals and breastfeeding support groups lease breast pumps on a monthly basis. A lactation consultant can teach you how to hand express breast milk, if you prefer not to use a pump.  CARING FOR YOUR BREASTS WHILE YOU BREASTFEED Nipples can become dry, cracked, and sore while breastfeeding. The following recommendations can help keep your breasts moisturized and healthy:  Avoid using soap on your nipples.   Wear a supportive bra. Although not required, special nursing bras and tank tops are designed to allow access to your breasts for breastfeeding without taking off your entire bra or top. Avoid wearing underwire-style bras or extremely tight bras.  Air dry your nipples for 3-42minutes after each feeding.   Use only cotton bra pads to absorb leaked breast milk. Leaking of breast milk between feedings is normal.   Use lanolin on your nipples after breastfeeding. Lanolin helps to maintain your skin's normal moisture barrier. If you use pure lanolin, you do not need to wash it off before feeding your baby again. Pure lanolin is not toxic to your baby. You may also hand express a few drops of breast milk and gently massage that milk into your nipples and allow the milk to air dry. In the first few weeks after giving birth, some women experience extremely full breasts  (engorgement). Engorgement can make your breasts feel heavy, warm, and tender to the touch. Engorgement peaks within 3-5 days after you give birth. The following recommendations can help ease engorgement:  Completely empty your breasts while breastfeeding or pumping. You may want to start by applying warm, moist heat (in the shower or with warm water-soaked hand towels) just before feeding or pumping. This increases circulation and helps the milk flow. If your baby does not completely empty your breasts while breastfeeding, pump any extra milk after he or she is finished.  Wear a snug bra (nursing or regular) or tank top for 1-2 days to signal your body to slightly decrease milk production.  Apply ice packs to your breasts, unless this is too uncomfortable for you.  Make sure that your baby is latched on and positioned properly while breastfeeding. If engorgement persists after 48 hours of following these recommendations, contact your health care provider or a Science writer. OVERALL HEALTH CARE RECOMMENDATIONS WHILE BREASTFEEDING  Eat healthy foods. Alternate between meals and snacks, eating 3 of each per day. Because what you eat affects your breast milk, some of the foods may make your baby more irritable than usual. Avoid eating these foods if you are sure that they are negatively affecting your baby.  Drink milk, fruit juice, and water to satisfy your thirst (about 10 glasses a day).   Rest often, relax, and continue to take your prenatal vitamins to prevent fatigue, stress, and anemia.  Continue breast self-awareness checks.  Avoid chewing and smoking tobacco.  Avoid alcohol and drug use. Some medicines that may be harmful to your baby can pass through breast milk. It is important to ask your health care provider before taking any medicine, including all over-the-counter and prescription medicine as well as vitamin and herbal supplements. It is possible to become pregnant while  breastfeeding. If birth control is desired, ask your health care provider about  options that will be safe for your baby. SEEK MEDICAL CARE IF:   You feel like you want to stop breastfeeding or have become frustrated with breastfeeding.  You have painful breasts or nipples.  Your nipples are cracked or bleeding.  Your breasts are red, tender, or warm.  You have a swollen area on either breast.  You have a fever or chills.  You have nausea or vomiting.  You have drainage other than breast milk from your nipples.  Your breasts do not become full before feedings by the fifth day after you give birth.  You feel sad and depressed.  Your baby is too sleepy to eat well.  Your baby is having trouble sleeping.   Your baby is wetting less than 3 diapers in a 24-hour period.  Your baby has less than 3 stools in a 24-hour period.  Your baby's skin or the white part of his or her eyes becomes yellow.   Your baby is not gaining weight by 78 days of age. SEEK IMMEDIATE MEDICAL CARE IF:   Your baby is overly tired (lethargic) and does not want to wake up and feed.  Your baby develops an unexplained fever. Document Released: 03/04/2005 Document Revised: 03/09/2013 Document Reviewed: 08/26/2012 [facility] Patient Information 2015 [place], [place]. This information is not intended to replace advice given to you by your health care provider. Make sure you discuss any questions you have with your health care provider.

## 2015-02-11 NOTE — Progress Notes (Signed)
Holly Thornton   Subjective: Post Partum Day 1 Vaginal delivery, skid mark Patient up ad lib, denies syncope or dizziness. Reports consuming regular diet without issues and denies N/V No issues with urination and reports bleeding is appropriate  Feeding:  breast Contraceptive plan:   unsure  Objective: Temp:  [97.7 F (36.5 C)-98.4 F (36.9 C)] 98.2 F (36.8 C) (11/26 0631) Pulse Rate:  [81-158] 94 (11/26 0631) Resp:  [16-20] 18 (11/26 0631) BP: (99-141)/(50-86) 117/69 mmHg (11/26 0631) SpO2:  [95 %-100 %] 95 % (11/25 1204)  Physical Exam:  General: alert and cooperative Ext: WNL, no significant  edema. No evidence of DVT seen on physical exam. Breast: Soft filling Lungs: CTAB Heart RRR without murmur  Abdomen:  Soft, fundus firm, lochia scant, + bowel sounds, non distended, non tender Lochia: appropriate Uterine Fundus: firm Laceration: healing well    Recent Labs  02/10/15 0438 02/11/15 0540  HGB 11.8* 9.3*  HCT 35.7* 27.9*    Assessment S/P Vaginal Delivery-Day 1 Stable  Normal Involution Breastfeeding  Plan: Continue current care Dr. Cletis Media updated on patient status  Plan for discharge tomorrow Lactation support   Blaise Palladino, CNM, MSN 02/11/2015, 11:14 AM

## 2015-02-12 MED ORDER — FERROUS SULFATE 325 (65 FE) MG PO TABS: 325.0000 mg | ORAL_TABLET | Freq: Two times a day (BID) | ORAL | Status: DC

## 2015-02-12 MED ORDER — IBUPROFEN 600 MG PO TABS: 600.0000 mg | ORAL_TABLET | Freq: Four times a day (QID) | ORAL | Status: DC

## 2015-02-12 MED ORDER — OXYCODONE-ACETAMINOPHEN 5-325 MG PO TABS: 1.0000 | ORAL_TABLET | ORAL | Status: DC | PRN

## 2015-02-12 NOTE — Lactation Note (Signed)
This note was copied from the chart of Girl Optician, dispensing. Lactation Consultation Note  Patient Name: Girl Kandie Tamas [id number] Date: 02/12/2015 Reason for consult: Follow-up assessment Mom and baby set for d/c today. Mom reports feeding are going well. She does have bilateral nipple soreness after some bf. No skin break down noted. Given comfort gels. She does have a Metallurgist. Reviewed set up, cleaning, and milk storage. Discussed feeding frequency, getting a deep latch every time, breast changes, manual expression, and nipple care. She is aware of OP services and support group. She plans to call [facility] on 02-13-15.   Maternal Data    Feeding Feeding Type: Breast Fed Length of feed: 30 min  LATCH Score/Interventions                      Lactation Tools Discussed/Used     Consult Status Consult Status: Complete Follow-up type: Call as needed    Denzil Hughes 02/12/2015, 9:53 AM

## 2015-02-17 ENCOUNTER — Encounter (HOSPITAL_COMMUNITY): Payer: Self-pay | Admitting: *Deleted

## 2015-02-23 NOTE — Progress Notes (Signed)
Post discharge chart review completed.  

## 2018-07-24 ENCOUNTER — Encounter (HOSPITAL_COMMUNITY): Payer: Self-pay | Admitting: *Deleted

## 2018-07-24 ENCOUNTER — Other Ambulatory Visit: Payer: Self-pay

## 2018-07-24 ENCOUNTER — Inpatient Hospital Stay (HOSPITAL_COMMUNITY)
Admission: AD | Admit: 2018-07-24 | Discharge: 2018-07-24 | Disposition: A | Discharge status: Home / Self Care | Payer: Managed Care, Other (non HMO) | Attending: Obstetrics & Gynecology | Admitting: Obstetrics & Gynecology

## 2018-07-24 ENCOUNTER — Inpatient Hospital Stay (HOSPITAL_COMMUNITY): Payer: Managed Care, Other (non HMO)

## 2018-07-24 DIAGNOSIS — R109 Unspecified abdominal pain: Secondary | ICD-10-CM | POA: Insufficient documentation

## 2018-07-24 DIAGNOSIS — O26891 Other specified pregnancy related conditions, first trimester: Secondary | ICD-10-CM | POA: Insufficient documentation

## 2018-07-24 DIAGNOSIS — J45909 Unspecified asthma, uncomplicated: Secondary | ICD-10-CM | POA: Diagnosis not present

## 2018-07-24 DIAGNOSIS — O00101 Right tubal pregnancy without intrauterine pregnancy: Secondary | ICD-10-CM | POA: Insufficient documentation

## 2018-07-24 DIAGNOSIS — O99511 Diseases of the respiratory system complicating pregnancy, first trimester: Secondary | ICD-10-CM | POA: Insufficient documentation

## 2018-07-24 DIAGNOSIS — O26899 Other specified pregnancy related conditions, unspecified trimester: Secondary | ICD-10-CM | POA: Diagnosis present

## 2018-07-24 DIAGNOSIS — Z3A01 Less than 8 weeks gestation of pregnancy: Secondary | ICD-10-CM | POA: Diagnosis not present

## 2018-07-24 DIAGNOSIS — O00109 Unspecified tubal pregnancy without intrauterine pregnancy: Secondary | ICD-10-CM | POA: Diagnosis present

## 2018-07-24 LAB — DIFFERENTIAL
Abs Immature Granulocytes: 0 10*3/uL (ref 0.00–0.07)
Basophils Absolute: 0 10*3/uL (ref 0.0–0.1)
Basophils Relative: 0 %
Eosinophils Absolute: 0 10*3/uL (ref 0.0–0.5)
Eosinophils Relative: 0 %
Lymphocytes Relative: 25 %
Lymphs Abs: 3.2 10*3/uL (ref 0.7–4.0)
Monocytes Absolute: 0.1 10*3/uL (ref 0.1–1.0)
Monocytes Relative: 1 %
Neutro Abs: 9.5 10*3/uL — ABNORMAL HIGH (ref 1.7–7.7)
Neutrophils Relative %: 74 %
nRBC: 0 /100 WBC

## 2018-07-24 LAB — CBC
HCT: 42.4 % (ref 36.0–46.0)
HCT: 41.6 % (ref 36.0–46.0)
Hemoglobin: 14.2 g/dL (ref 12.0–15.0)
Hemoglobin: 14 g/dL (ref 12.0–15.0)
MCH: 27.8 pg (ref 26.0–34.0)
MCH: 27.9 pg (ref 26.0–34.0)
MCHC: 33.5 g/dL (ref 30.0–36.0)
MCHC: 33.7 g/dL (ref 30.0–36.0)
MCV: 83 fL (ref 80.0–100.0)
MCV: 82.9 fL (ref 80.0–100.0)
Platelets: 345 10*3/uL (ref 150–400)
Platelets: 354 10*3/uL (ref 150–400)
RBC: 5.11 MIL/uL (ref 3.87–5.11)
RBC: 5.02 MIL/uL (ref 3.87–5.11)
RDW: 12.8 % (ref 11.5–15.5)
RDW: 12.9 % (ref 11.5–15.5)
WBC: 12.2 10*3/uL — ABNORMAL HIGH (ref 4.0–10.5)
WBC: 12.9 10*3/uL — ABNORMAL HIGH (ref 4.0–10.5)
nRBC: 0 % (ref 0.0–0.2)
nRBC: 0 % (ref 0.0–0.2)

## 2018-07-24 LAB — URINALYSIS, ROUTINE W REFLEX MICROSCOPIC
Bilirubin Urine: NEGATIVE
Glucose, UA: NEGATIVE mg/dL
Hgb urine dipstick: NEGATIVE
Ketones, ur: 80 mg/dL — AB
Nitrite: NEGATIVE
Protein, ur: 30 mg/dL — AB
Specific Gravity, Urine: 1.042 — ABNORMAL HIGH (ref 1.005–1.030)
pH: 5 (ref 5.0–8.0)

## 2018-07-24 LAB — COMPREHENSIVE METABOLIC PANEL
ALT: 43 U/L (ref 0–44)
AST: 29 U/L (ref 15–41)
Albumin: 5 g/dL (ref 3.5–5.0)
Alkaline Phosphatase: 32 U/L — ABNORMAL LOW (ref 38–126)
Anion gap: 14 (ref 5–15)
BUN: 12 mg/dL (ref 6–20)
CO2: 18 mmol/L — ABNORMAL LOW (ref 22–32)
Calcium: 9.8 mg/dL (ref 8.9–10.3)
Chloride: 103 mmol/L (ref 98–111)
Creatinine, Ser: 0.83 mg/dL (ref 0.44–1.00)
GFR calc Af Amer: >60 mL/min (ref >60)
GFR calc non Af Amer: >60 mL/min (ref >60)
Glucose, Bld: 104 mg/dL — ABNORMAL HIGH (ref 70–99)
Potassium: 3.7 mmol/L (ref 3.5–5.1)
Sodium: 135 mmol/L (ref 135–145)
Total Bilirubin: 0.8 mg/dL (ref 0.3–1.2)
Total Protein: 8.6 g/dL — ABNORMAL HIGH (ref 6.5–8.1)

## 2018-07-24 LAB — WET PREP, GENITAL
Clue Cells Wet Prep HPF POC: NONE SEEN
Sperm: NONE SEEN
Trich, Wet Prep: NONE SEEN

## 2018-07-24 LAB — POCT PREGNANCY, URINE: Preg Test, Ur: POSITIVE — AB

## 2018-07-24 LAB — HCG, QUANTITATIVE, PREGNANCY: hCG, Beta Chain, Quant, S: 1873 m[IU]/mL — ABNORMAL HIGH (ref <5)

## 2018-07-24 MED ORDER — METHOTREXATE FOR ECTOPIC PREGNANCY
50.0000 mg/m2 | Freq: Once | INTRAMUSCULAR | Status: AC
  Administered 2018-07-24: 95 mg via INTRAMUSCULAR
  Filled 2018-07-24: qty 1

## 2018-07-24 NOTE — Discharge Instructions (Signed)
Please avoid taking Ibuprofen (Motrin, Aleve, Advil, and NSAID). Avoid direct sunlight. Do not continue taking prenatal vitamins.

## 2018-07-24 NOTE — MAU Provider Note (Signed)
History     CSN: [id number]  Arrival date and time: 07/24/18 1435   First Provider Initiated Contact with Patient 07/24/18 1523      Chief Complaint  Patient presents with  . Abdominal Pain   HPI  Ms.  Holly Thornton is a 28 y.o. year old G32P1001 female at [redacted]w[redacted]d weeks gestation who presents to MAU reporting a (+) HPT on 07/20/2018. She reports that her periods are regular. Her LMP was 4/4-7. When she didn't get her period on 5/4, she took a HPT. She reports that she has had "severe" lower abdominal pain since 5/4. She has not taken anything for the pain. She denies any VB, but does report having some thick, clumpy vaginal d/c that she has noticed. She has a h/o vaginal yeast infections with her first pregnancy.  Past Medical History:  Diagnosis Date  . ADHD (attention deficit hyperactivity disorder)   . Asthma    child  . Colitis    at age 79  . Frequent UTI   . Herpes genitalis in women     Past Surgical History:  Procedure Laterality Date  . NO PAST SURGERIES      Family History  Problem Relation Age of Onset  . Depression Mother   . Fibroids Mother   . Anemia Mother   . Diabetes Maternal Grandmother     Social History   Tobacco Use  . Smoking status: Never Smoker  . Smokeless tobacco: Never Used  Substance Use Topics  . Alcohol use: Not Currently    Alcohol/week: 1.0 standard drinks    Types: 1 Standard drinks or equivalent per week  . Drug use: No    Allergies:  Allergies  Allergen Reactions  . Amoxicillin Anaphylaxis and Swelling    Has patient had a PCN reaction causing immediate rash, facial/tongue/throat swelling, SOB or lightheadedness with hypotension: Yes Has patient had a PCN reaction causing severe rash involving mucus membranes or skin necrosis: No Has patient had a PCN reaction that required hospitalization No Has patient had a PCN reaction occurring within the last 10 years: No If all of the above answers are "NO", then may proceed with  Cephalosporin use.   Marland Kitchen Peanut-Containing Drug Products Anaphylaxis, Hives, Diarrhea, Rash and Other (See Comments)    Puffiness of the eys     Medications Prior to Admission  Medication Sig Dispense Refill Last Dose  . ferrous sulfate 325 (65 FE) MG tablet Take 1 tablet (325 mg total) by mouth 2 (two) times daily with a meal. 60 tablet 3   . ibuprofen (ADVIL,MOTRIN) 600 MG tablet Take 1 tablet (600 mg total) by mouth every 6 (six) hours. 30 tablet 0   . oxyCODONE-acetaminophen (PERCOCET/ROXICET) 5-325 MG tablet Take 1 tablet by mouth every 4 (four) hours as needed (for pain scale 4-7). 30 tablet 0   . Prenatal Vit-Min-FA-Fish Oil ([facility] PRENATAL GUMMY) 0.4-113.5 MG CHEW Chew 2 each by mouth daily.   02/09/2015 at Unknown time    Review of Systems  Constitutional: Negative.   HENT: Negative.   Eyes: Negative.   Respiratory: Negative.   Cardiovascular: Negative.   Gastrointestinal: Negative.   Endocrine: Negative.   Genitourinary: Positive for pelvic pain (sharp lower abd pain).  Musculoskeletal: Negative.   Skin: Negative.   Allergic/Immunologic: Negative.   Neurological: Negative.   Hematological: Negative.   Psychiatric/Behavioral: Negative.    Physical Exam   Blood pressure 128/73, pulse 78, temperature 98.7 F (37.1 C), temperature source Oral, resp.  rate 18, height 5\' 4"  (1.626 m), weight 75.5 kg, last menstrual period 06/20/2018, SpO2 100 %.  Physical Exam  Nursing note and vitals reviewed. Constitutional: She is oriented to person, place, and time. She appears well-developed and well-nourished.  HENT:  Head: Normocephalic and atraumatic.  Eyes: Pupils are equal, round, and reactive to light.  Neck: Normal range of motion.  Cardiovascular: Normal rate, regular rhythm, normal heart sounds and intact distal pulses.  Respiratory: Effort normal and breath sounds normal.  GI: Soft. Bowel sounds are normal.  Genitourinary:    Genitourinary Comments: Uterus: non-tender, SE:  cervix is smooth, pink, no lesions, small amt of thick,cottage cheese like, white vaginal d/c -- WP, GC/CT done, closed/long/firm, no CMT or friability, no adnexal tenderness    Musculoskeletal: Normal range of motion.  Neurological: She is alert and oriented to person, place, and time. She has normal reflexes.  Skin: Skin is warm and dry.  Psychiatric: She has a normal mood and affect. Her behavior is normal. Judgment and thought content normal.    MAU Course  Procedures  MDM CCUA UPT CBC Known ABO/Rh -- O Positive HCG Wet Prep GC/CT -- pending HIV -- pending OB < 14 wks [place] with TV  Results for orders placed or performed during the hospital encounter of 07/24/18 (from the past 24 hour(s))  Urinalysis, Routine w reflex microscopic     Status: Abnormal   Collection Time: 07/24/18  2:56 PM  Result Value Ref Range   Color, Urine AMBER (A) YELLOW   APPearance CLOUDY (A) CLEAR   Specific Gravity, Urine 1.042 (H) 1.005 - 1.030   pH 5.0 5.0 - 8.0   Glucose, UA NEGATIVE NEGATIVE mg/dL   Hgb urine dipstick NEGATIVE NEGATIVE   Bilirubin Urine NEGATIVE NEGATIVE   Ketones, ur 80 (A) NEGATIVE mg/dL   Protein, ur 30 (A) NEGATIVE mg/dL   Nitrite NEGATIVE NEGATIVE   Leukocytes,Ua MODERATE (A) NEGATIVE   RBC / HPF 0-5 0 - 5 RBC/hpf   WBC, UA 6-10 0 - 5 WBC/hpf   Bacteria, UA FEW (A) NONE SEEN   Squamous Epithelial / LPF 11-20 0 - 5   Mucus PRESENT   Pregnancy, urine POC     Status: Abnormal   Collection Time: 07/24/18  3:08 PM  Result Value Ref Range   Preg Test, Ur POSITIVE (A) NEGATIVE  Wet prep, genital     Status: Abnormal   Collection Time: 07/24/18  3:22 PM  Result Value Ref Range   Yeast Wet Prep HPF POC PRESENT (A) NONE SEEN   Trich, Wet Prep NONE SEEN NONE SEEN   Clue Cells Wet Prep HPF POC NONE SEEN NONE SEEN   WBC, Wet Prep HPF POC MANY (A) NONE SEEN   Sperm NONE SEEN   CBC     Status: Abnormal   Collection Time: 07/24/18  3:58 PM  Result Value Ref Range   WBC 12.2  (H) 4.0 - 10.5 K/uL   RBC 5.11 3.87 - 5.11 MIL/uL   Hemoglobin 14.2 12.0 - 15.0 g/dL   HCT 42.4 36.0 - 46.0 %   MCV 83.0 80.0 - 100.0 fL   MCH 27.8 26.0 - 34.0 pg   MCHC 33.5 30.0 - 36.0 g/dL   RDW 12.8 11.5 - 15.5 %   Platelets 345 150 - 400 K/uL   nRBC 0.0 0.0 - 0.2 %  hCG, quantitative, pregnancy     Status: Abnormal   Collection Time: 07/24/18  3:58 PM  Result Value  Ref Range   hCG, Beta Chain, Quant, S 1,873 (H) <5 mIU/mL  Comprehensive metabolic panel     Status: Abnormal   Collection Time: 07/24/18  5:55 PM  Result Value Ref Range   Sodium 135 135 - 145 mmol/L   Potassium 3.7 3.5 - 5.1 mmol/L   Chloride 103 98 - 111 mmol/L   CO2 18 (L) 22 - 32 mmol/L   Glucose, Bld 104 (H) 70 - 99 mg/dL   BUN 12 6 - 20 mg/dL   Creatinine, Ser 0.83 0.44 - 1.00 mg/dL   Calcium 9.8 8.9 - 10.3 mg/dL   Total Protein 8.6 (H) 6.5 - 8.1 g/dL   Albumin 5.0 3.5 - 5.0 g/dL   AST 29 15 - 41 U/L   ALT 43 0 - 44 U/L   Alkaline Phosphatase 32 (L) 38 - 126 U/L   Total Bilirubin 0.8 0.3 - 1.2 mg/dL   GFR calc non Af Amer >60 >60 mL/min   GFR calc Af Amer >60 >60 mL/min   Anion gap 14 5 - 15  Differential     Status: Abnormal   Collection Time: 07/24/18  5:55 PM  Result Value Ref Range   Neutrophils Relative % 74 %   Neutro Abs 9.5 (H) 1.7 - 7.7 K/uL   Lymphocytes Relative 25 %   Lymphs Abs 3.2 0.7 - 4.0 K/uL   Monocytes Relative 1 %   Monocytes Absolute 0.1 0.1 - 1.0 K/uL   Eosinophils Relative 0 %   Eosinophils Absolute 0.0 0.0 - 0.5 K/uL   Basophils Relative 0 %   Basophils Absolute 0.0 0.0 - 0.1 K/uL   WBC Morphology See Note    nRBC 0 0 /100 WBC   Abs Immature Granulocytes 0.00 0.00 - 0.07 K/uL  CBC     Status: Abnormal   Collection Time: 07/24/18  5:55 PM  Result Value Ref Range   WBC 12.9 (H) 4.0 - 10.5 K/uL   RBC 5.02 3.87 - 5.11 MIL/uL   Hemoglobin 14.0 12.0 - 15.0 g/dL   HCT 41.6 36.0 - 46.0 %   MCV 82.9 80.0 - 100.0 fL   MCH 27.9 26.0 - 34.0 pg   MCHC 33.7 30.0 - 36.0 g/dL    RDW 12.9 11.5 - 15.5 %   Platelets 354 150 - 400 K/uL   nRBC 0.0 0.0 - 0.2 %    US Ob Less Than 14 Weeks With Ob Transvaginal  Result Date: 07/24/2018 CLINICAL DATA:  28 year old pregnant female presents with abdominal pain. Quantitative beta HCG pending. EDC by LMP: 03/27/2019, projecting to an expected gestational age of [redacted] weeks 6 days. EXAM: OBSTETRIC <14 WK [place] AND TRANSVAGINAL OB US TECHNIQUE: Both transabdominal and transvaginal ultrasound examinations were performed for complete evaluation of the gestation as well as the maternal uterus, adnexal regions, and pelvic cul-de-sac. Transvaginal technique was performed to assess early pregnancy. COMPARISON:  No prior scans from this gestation FINDINGS: Anteverted uterus. No uterine fibroids or other myometrial abnormalities. There is a nonspecific tiny 4 x 3 x 5 mm thin-walled cystic structure within the uterine cavity, without associated yolk sac, embryo or embryonic cardiac activity. No additional focal endometrial abnormality. Right ovary measures 4.2 x 2.1 x 1.7 cm and contains a corpus luteum. There is a separate right adnexal mass with mixed echogenicity measuring 2.5 x 1.7 x 1.6 cm with central cystic focus, which is separate from the right ovary, and is suspicious for a right tubal ectopic pregnancy. No yolk  sac, embryo or embryonic activity are demonstrated within this right adnexal mass. Left ovary measures 2.2 x 1.9 x 2.2 cm. No abnormal left ovarian or left adnexal masses. No abnormal free fluid in the pelvis. IMPRESSION: 1. Right adnexal 2.5 x 1.7 x 1.6 cm mass with mixed echogenicity with central cystic focus, separate from the right ovary, suspicious for a right tubal ectopic pregnancy. No yolk sac, embryo or embryonic activity within this right adnexal mass. No abnormal free fluid in the pelvis. 2. No definite intrauterine gestation. Nonspecific tiny 5 mm thin-walled cystic structure within the uterine cavity without yolk sac, embryo or  embryonic cardiac activity, favored to represent a pseudo-gestational sac. Critical Value/emergent results were called by telephone at the time of interpretation on 07/24/2018 at 5:14 pm to Dr. Laury Deep, who verbally acknowledged these results. Electronically Signed   By: Ilona Sorrel M.D.   On: 07/24/2018 17:17   Assessment and Plan  Right tubal pregnancy without intrauterine pregnancy  - MTX treatment plan discussed with patient - Return to MAU for increased pain that is not relieved with Tylenol - Information provided on abd pain in pg, ectopic pregnancy and MTX treatment for ectopic pg    Candida Vaginitis - Rx Terazol cream 5.6% 1 applicatorful hs x 5 days   - Discharge patient - Appts scheduled for Monday 5/11 and Thursday 5/14/ @ [place], MSN, CNM 07/24/2018, 3:23 PM

## 2018-07-24 NOTE — MAU Note (Signed)
+  HPT on Monday.  Since then, has been having severe pain on RLQ.

## 2018-07-25 ENCOUNTER — Other Ambulatory Visit: Payer: Self-pay | Admitting: Obstetrics and Gynecology

## 2018-07-25 DIAGNOSIS — B3731 Acute candidiasis of vulva and vagina: Secondary | ICD-10-CM

## 2018-07-25 DIAGNOSIS — B373 Candidiasis of vulva and vagina: Secondary | ICD-10-CM

## 2018-07-25 LAB — HIV ANTIBODY (ROUTINE TESTING W REFLEX): HIV Screen 4th Generation wRfx: NONREACTIVE

## 2018-07-25 MED ORDER — TERCONAZOLE 0.4 % VA CREA: 1.0000 | TOPICAL_CREAM | Freq: Every day | VAGINAL | 0 refills | Status: AC

## 2018-07-25 NOTE — Progress Notes (Signed)
LVM notifying patient of Rx sent to pharmacy. Instructed to call MAU provider's office with any questions.  Laury Deep, CNM

## 2018-07-27 ENCOUNTER — Ambulatory Visit (INDEPENDENT_AMBULATORY_CARE_PROVIDER_SITE_OTHER): Payer: Managed Care, Other (non HMO) | Admitting: *Deleted

## 2018-07-27 ENCOUNTER — Other Ambulatory Visit: Payer: Self-pay

## 2018-07-27 DIAGNOSIS — O00101 Right tubal pregnancy without intrauterine pregnancy: Secondary | ICD-10-CM

## 2018-07-27 LAB — GC/CHLAMYDIA PROBE AMP (~~LOC~~) NOT AT ARMC
Chlamydia: NEGATIVE
Neisseria Gonorrhea: NEGATIVE

## 2018-07-27 LAB — BETA HCG QUANT (REF LAB): hCG Quant: 1726 m[IU]/mL

## 2018-07-27 NOTE — Progress Notes (Signed)
Pt presents for Day 4 Stat bhcg s/p methotrexate. Pt denies any vaginal bleeding or abdominal pain.  Advised pt that the lab takes 1-2 hours to result and she will be contacted with the results and plan of care.  Pt verbalized understanding and stated a good number where she can be reached is [phone].   STAT bhcg resulted at 1726.  Reviewed with Dr. Hulan Fray.  Pt to follow up for day 7 labs s/p MTX. Called pt with results and plan of care.  Pt verbalized understanding and has appointment for Thursday 5/14 @ 1100.

## 2018-07-29 ENCOUNTER — Other Ambulatory Visit: Payer: Self-pay | Admitting: Obstetrics & Gynecology

## 2018-07-29 ENCOUNTER — Observation Stay (HOSPITAL_COMMUNITY)
Admission: AD | Admit: 2018-07-29 | Discharge: 2018-07-30 | Disposition: A | Discharge status: Home / Self Care | Payer: Managed Care, Other (non HMO) | Attending: Obstetrics & Gynecology | Admitting: Obstetrics & Gynecology

## 2018-07-29 ENCOUNTER — Inpatient Hospital Stay (HOSPITAL_COMMUNITY): Payer: Managed Care, Other (non HMO)

## 2018-07-29 ENCOUNTER — Encounter (HOSPITAL_COMMUNITY): Payer: Self-pay

## 2018-07-29 ENCOUNTER — Other Ambulatory Visit: Payer: Self-pay

## 2018-07-29 DIAGNOSIS — O009 Unspecified ectopic pregnancy without intrauterine pregnancy: Principal | ICD-10-CM | POA: Diagnosis present

## 2018-07-29 DIAGNOSIS — O26899 Other specified pregnancy related conditions, unspecified trimester: Secondary | ICD-10-CM

## 2018-07-29 DIAGNOSIS — R1031 Right lower quadrant pain: Secondary | ICD-10-CM | POA: Diagnosis present

## 2018-07-29 DIAGNOSIS — O00101 Right tubal pregnancy without intrauterine pregnancy: Secondary | ICD-10-CM

## 2018-07-29 LAB — CBC WITH DIFFERENTIAL/PLATELET
Abs Immature Granulocytes: 0.04 10*3/uL (ref 0.00–0.07)
Basophils Absolute: 0 10*3/uL (ref 0.0–0.1)
Basophils Relative: 0 %
Eosinophils Absolute: 0 10*3/uL (ref 0.0–0.5)
Eosinophils Relative: 0 %
HCT: 37.6 % (ref 36.0–46.0)
Hemoglobin: 12.6 g/dL (ref 12.0–15.0)
Immature Granulocytes: 0 %
Lymphocytes Relative: 19 %
Lymphs Abs: 2.4 10*3/uL (ref 0.7–4.0)
MCH: 27.7 pg (ref 26.0–34.0)
MCHC: 33.5 g/dL (ref 30.0–36.0)
MCV: 82.6 fL (ref 80.0–100.0)
Monocytes Absolute: 0.4 10*3/uL (ref 0.1–1.0)
Monocytes Relative: 3 %
Neutro Abs: 10.1 10*3/uL — ABNORMAL HIGH (ref 1.7–7.7)
Neutrophils Relative %: 78 %
Platelets: 315 10*3/uL (ref 150–400)
RBC: 4.55 MIL/uL (ref 3.87–5.11)
RDW: 12.9 % (ref 11.5–15.5)
WBC: 13 10*3/uL — ABNORMAL HIGH (ref 4.0–10.5)
nRBC: 0 % (ref 0.0–0.2)

## 2018-07-29 LAB — URINALYSIS, ROUTINE W REFLEX MICROSCOPIC
Bacteria, UA: NONE SEEN
Bilirubin Urine: NEGATIVE
Glucose, UA: NEGATIVE mg/dL
Hgb urine dipstick: NEGATIVE
Ketones, ur: 20 mg/dL — AB
Nitrite: NEGATIVE
Protein, ur: NEGATIVE mg/dL
Specific Gravity, Urine: 1.023 (ref 1.005–1.030)
pH: 5 (ref 5.0–8.0)

## 2018-07-29 LAB — HCG, QUANTITATIVE, PREGNANCY: hCG, Beta Chain, Quant, S: 2520 m[IU]/mL — ABNORMAL HIGH (ref <5)

## 2018-07-29 MED ORDER — HYDROMORPHONE HCL 1 MG/ML IJ SOLN
0.5000 mg | Freq: Once | INTRAMUSCULAR | Status: AC
  Administered 2018-07-29: 0.5 mg via INTRAMUSCULAR
  Filled 2018-07-29: qty 1

## 2018-07-29 MED ORDER — ONDANSETRON 4 MG PO TBDP
4.0000 mg | ORAL_TABLET | Freq: Once | ORAL | Status: AC
  Administered 2018-07-29: 23:00:00 4 mg via ORAL
  Filled 2018-07-29: qty 1

## 2018-07-29 NOTE — MAU Provider Note (Signed)
Chief Complaint: Abdominal Pain   First Provider Initiated Contact with Patient 07/29/18 2222        SUBJECTIVE HPI: Holly Thornton is a 28 y.o. G2P1001 at [redacted]w[redacted]d by LMP who presents to maternity admissions reporting increased abdominal pain since receiving Methotrexate for a right ectopic on 07/24/2018.  Pain is lower and upper right side.  Also has nausea. . She denies vaginal bleeding, vaginal itching/burning, urinary symptoms, h/a, dizziness, or fever/chills.    RN Note: Pt received methotrexate on Friday for ectopic pg. Had day 4 labs Monday.  Has felt fine until noon today when she  began feeling pain again (8/8), chills and nausea. No fever or bleeding.   Past Medical History:  Diagnosis Date  . ADHD (attention deficit hyperactivity disorder)   . Asthma    child  . Colitis    at age 42  . Frequent UTI   . Herpes genitalis in women    Past Surgical History:  Procedure Laterality Date  . NO PAST SURGERIES     Social History   Socioeconomic History  . Marital status: Married    Spouse name: Not on file  . Number of children: 0  . Years of education: Not on file  . Highest education level: Not on file  Occupational History  . Occupation: college  Social Needs  . Financial resource strain: Not on file  . Food insecurity:    Worry: Not on file    Inability: Not on file  . Transportation needs:    Medical: Not on file    Non-medical: Not on file  Tobacco Use  . Smoking status: Never Smoker  . Smokeless tobacco: Never Used  Substance and Sexual Activity  . Alcohol use: Not Currently    Alcohol/week: 1.0 standard drinks    Types: 1 Standard drinks or equivalent per week  . Drug use: No  . Sexual activity: Yes    Birth control/protection: None  Lifestyle  . Physical activity:    Days per week: Not on file    Minutes per session: Not on file  . Stress: Not on file  Relationships  . Social connections:    Talks on phone: Not on file    Gets together: Not on  file    Attends religious service: Not on file    Active member of club or organization: Not on file    Attends meetings of clubs or organizations: Not on file    Relationship status: Not on file  . Intimate partner violence:    Fear of current or ex partner: Not on file    Emotionally abused: Not on file    Physically abused: Not on file    Forced sexual activity: Not on file  Other Topics Concern  . Not on file  Social History Narrative   Regular exercise:  Yes, walks   Caffeine use:  2 glasses soda daily            No current facility-administered medications on file prior to encounter.    Current Outpatient Medications on File Prior to Encounter  Medication Sig Dispense Refill  . acetaminophen (TYLENOL) 500 MG tablet Take 500 mg by mouth every 6 (six) hours as needed.    Marland Kitchen terconazole (TERAZOL 7) 0.4 % vaginal cream Place 1 applicator vaginally at bedtime for 7 days. 45 g 0   Allergies  Allergen Reactions  . Amoxicillin Anaphylaxis and Swelling    Has patient had a PCN reaction causing  immediate rash, facial/tongue/throat swelling, SOB or lightheadedness with hypotension: Yes Has patient had a PCN reaction causing severe rash involving mucus membranes or skin necrosis: No Has patient had a PCN reaction that required hospitalization No Has patient had a PCN reaction occurring within the last 10 years: No If all of the above answers are "NO", then may proceed with Cephalosporin use.   Marland Kitchen Peanut-Containing Drug Products Anaphylaxis, Hives, Diarrhea, Rash and Other (See Comments)    Puffiness of the eys     I have reviewed patient's Past Medical Hx, Surgical Hx, Family Hx, Social Hx, medications and allergies.   ROS:  Review of Systems  Constitutional: Negative for chills and fever.  Respiratory: Negative for shortness of breath.   Gastrointestinal: Positive for abdominal pain. Negative for constipation and diarrhea.  Genitourinary: Negative for dysuria and vaginal  bleeding.   Review of Systems  Other systems negative   Physical Exam  Patient Vitals for the past 24 hrs:  BP Temp Temp src Pulse Resp SpO2 Height Weight  07/29/18 2208 133/77 98.2 F (36.8 C) Oral 74 18 100 % 5\' 4"  (1.626 m) 77.5 kg    Constitutional: Well-developed, well-nourished female in no acute distress.  Cardiovascular: normal rate Respiratory: normal effort GI: Abd soft, tender over right side,  No guarding or rebound. Pos BS x 4 MS: Extremities nontender, no edema, normal ROM Neurologic: Alert and oriented x 4.  GU: Neg CVAT.  PELVIC EXAM: deferred due to recent Methotrexate  LAB RESULTS No results found for this or any previous visit (from the past 24 hour(s)).     IMAGING US Ob Transvaginal  Result Date: 07/30/2018 CLINICAL DATA:  Known right ectopic pregnancy, 5 days post methotrexate. New pelvic pain. EXAM: TRANSVAGINAL OB ULTRASOUND TECHNIQUE: Transvaginal ultrasound was performed for complete evaluation of the gestation as well as the maternal uterus, adnexal regions, and pelvic cul-de-sac. COMPARISON:  Obstetric ultrasound 5 days prior 07/24/2018 FINDINGS: Intrauterine gestational sac: None. Yolk sac:  Not Visualized. Embryo:  Not Visualized. Maternal uterus/adnexae: Tiny cystic structures in the endometrium all measure less than 4 mm,, slightly diminished in size and increased in number from prior exam. Heterogeneous masslike focus adjacent to the right ovary measures 1.8 x 1.6 x 1.9 cm consistent with known ectopic pregnancy. This previously measured 2.5 x 1.7 x 1.6 cm. The central cystic focus on prior exam persists but is less well appreciated. The adjacent right ovary measures 2.7 x 1.9 x 1.7 cm. The left ovary appears normal. Moderate volume of complex free fluid in the pelvis is new from prior. IMPRESSION: Known right ectopic pregnancy persists and has slightly diminished in size over the past 5 days with persistent 1.9 cm masslike area. There is a new moderate  volume of complex free fluid in the pelvis, which is concerning for interval rupture. Critical Value/emergent results were called by telephone at the time of interpretation on 07/30/2018 at 12:04 am to [facility] , who verbally acknowledged these results. Electronically Signed   By: Keith Rake M.D.   On: 07/30/2018 00:07    MAU Management/MDM: Ordered CBC and [place].  Hemoglobin has dropped slightly  Ref. Range 07/24/2018 17:55  07/29/2018 22:35  Hemoglobin Latest Ref Range: 12.0 - 15.0 g/dL 14.0  12.6   HCGs  Day 0              Day 4         Day 6  Ref. Range 07/24/2018 15:58 07/27/18  10:58 07/29/2018 22:35  HCG, Beta Chain, Quant, S Latest Ref Range: <5 mIU/mL 1,873 (H) 1726 (H) 2,520 (H)   Dilaudid 0.5mg  gave good relief from pain Zofran gave good relief from nausea  Discussed with Dr Elonda Husky Will admit for obs over night and recheck CBC in am  WIll start Saline Lock and order Type/Screen Dilaudid and Zofran PRN tonight  If hemoglobin stable in AM, will discharge home   ASSESSMENT 1. Abdominal pain affecting pregnancy   2. Right tubal pregnancy without intrauterine pregnancy     PLAN  See above Admit for observation Recheck CBC in am at 0600 MD to follow   Hansel Feinstein CNM, MSN Certified Nurse-Midwife 07/29/2018  10:22 PM

## 2018-07-29 NOTE — MAU Note (Signed)
Pt received methotrexate on Friday for ectopic pg. Had day 4 labs Monday.  Has felt fine until noon today when she  began feeling pain again (8/8), chills and nausea. No fever or bleeding.

## 2018-07-30 ENCOUNTER — Other Ambulatory Visit: Payer: Managed Care, Other (non HMO)

## 2018-07-30 ENCOUNTER — Encounter (HOSPITAL_COMMUNITY): Payer: Self-pay | Admitting: *Deleted

## 2018-07-30 ENCOUNTER — Other Ambulatory Visit: Payer: Self-pay

## 2018-07-30 DIAGNOSIS — O009 Unspecified ectopic pregnancy without intrauterine pregnancy: Secondary | ICD-10-CM | POA: Diagnosis present

## 2018-07-30 LAB — CBC
HCT: 34.1 % — ABNORMAL LOW (ref 36.0–46.0)
Hemoglobin: 11.5 g/dL — ABNORMAL LOW (ref 12.0–15.0)
MCH: 27.9 pg (ref 26.0–34.0)
MCHC: 33.7 g/dL (ref 30.0–36.0)
MCV: 82.8 fL (ref 80.0–100.0)
Platelets: 291 10*3/uL (ref 150–400)
RBC: 4.12 MIL/uL (ref 3.87–5.11)
RDW: 13 % (ref 11.5–15.5)
WBC: 12.3 10*3/uL — ABNORMAL HIGH (ref 4.0–10.5)
nRBC: 0 % (ref 0.0–0.2)

## 2018-07-30 LAB — TYPE AND SCREEN
ABO/RH(D): O POS
Antibody Screen: NEGATIVE

## 2018-07-30 LAB — ABO/RH: ABO/RH(D): O POS

## 2018-07-30 MED ORDER — ONDANSETRON 4 MG PO TBDP: 4.0000 mg | ORAL_TABLET | Freq: Three times a day (TID) | ORAL | Status: DC | PRN

## 2018-07-30 MED ORDER — DOCUSATE SODIUM 100 MG PO CAPS: 100.0000 mg | ORAL_CAPSULE | Freq: Every day | ORAL | Status: DC

## 2018-07-30 MED ORDER — HYDROMORPHONE HCL 1 MG/ML IJ SOLN
0.5000 mg | INTRAMUSCULAR | Status: DC | PRN
  Administered 2018-07-30: 0.5 mg via INTRAMUSCULAR
  Filled 2018-07-30: qty 0.5

## 2018-07-30 MED ORDER — CALCIUM CARBONATE ANTACID 500 MG PO CHEW: 2.0000 | CHEWABLE_TABLET | ORAL | Status: DC | PRN

## 2018-07-30 MED ORDER — ACETAMINOPHEN 325 MG PO TABS: 650.0000 mg | ORAL_TABLET | ORAL | Status: DC | PRN

## 2018-07-30 MED ORDER — ZOLPIDEM TARTRATE 5 MG PO TABS: 5.0000 mg | ORAL_TABLET | Freq: Every evening | ORAL | Status: DC | PRN

## 2018-07-30 MED ORDER — KETOROLAC TROMETHAMINE 10 MG PO TABS: 10.0000 mg | ORAL_TABLET | Freq: Three times a day (TID) | ORAL | 0 refills | Status: DC | PRN

## 2018-07-30 NOTE — Discharge Summary (Signed)
Physician Discharge Summary  Patient ID: Holly Thornton MRN: [id number] DOB/AGE: Aug 01, 1990 28 y.o.  Admit date: 07/29/2018 Discharge date: 07/30/2018  Admission Diagnoses: Ectopic pregnancy s/p methotrexate x 1 with abdominal pain  Discharge Diagnoses:  Active Problems:   Ectopic pregnancy   Discharged Condition: stable  Hospital Course: pt has had resolution of pain and is without symptoms.  Her hemoglobin is relatively stable 10 hours after initial draw Her sonogram definitely has some free fluid and probably blood but it is minimal not moderate certainly less than 50 cc is estimated  She understands there is still a danger of needing surgical intervention which she wants to avoid right now if possible.  She is given strict instructions  Consults: None  Significant Diagnostic Studies: labs:  and sonogram  Treatments: observation  Discharge Exam: Blood pressure 126/68, pulse 79, temperature 98.2 F (36.8 C), temperature source Oral, resp. rate 18, height 5\' 4"  (1.626 m), weight 77.5 kg, last menstrual period 06/20/2018, SpO2 99 %, unknown if currently breastfeeding. General appearance: alert, cooperative and no distress GI: soft, non-tender; bowel sounds normal; no masses,  no organomegaly Completely benign Disposition: Discharge disposition: 01-Home or Self Care       Discharge Instructions    Call MD for:  severe uncontrolled pain   Complete by:  As directed    Diet - low sodium heart healthy   Complete by:  As directed    Increase activity slowly   Complete by:  As directed    Sexual Activity Restrictions   Complete by:  As directed    No sex      Follow-up Information    [facility] Follow up on 08/02/2018.   Specialty:  Obstetrics and Gynecology Why:  repeat HCG level Contact information: [address] [id number] [place] [phone]          Signed: Florian Buff 07/30/2018, 7:45  AM

## 2018-07-30 NOTE — H&P (Signed)
Attested      Attestation signed by Florian Buff, MD at 07/30/2018 4:42 AM  Attestation of Attending Supervision of Advanced Practitioner (CNM/NP/PA): Evaluation and management procedures were performed by the Advanced Practitioner under my supervision and collaboration. I have reviewed the Advanced Practitioner's note and chart, and I agree with the management and plan.  Jacelyn Grip MD Attending Physician for the [facility] 07/30/2018 4:42 AM     Expand All Collapse All    Show:Clear all [x] Manual[x] Template[x] Copied  Added by: [x] Seabron Spates, CNM  [] Hover for details Chief Complaint: Abdominal Pain   First Provider Initiated Contact with Patient 07/29/18 2222        SUBJECTIVE HPI: Holly Thornton is a 28 y.o. G2P1001 at [redacted]w[redacted]d by LMP who presents to maternity admissions reporting increased abdominal pain since receiving Methotrexate for a right ectopic on 07/24/2018.  Pain is lower and upper right side.  Also has nausea. . She denies vaginal bleeding, vaginal itching/burning, urinary symptoms, h/a, dizziness, or fever/chills.    RN Note: Pt received methotrexate on Friday for ectopic pg. Had day 4 labs Monday. Has felt fine until noon today when she began feeling pain again (8/8), chills and nausea. No fever or bleeding.       Past Medical History:  Diagnosis Date  . ADHD (attention deficit hyperactivity disorder)   . Asthma    child  . Colitis    at age 13  . Frequent UTI   . Herpes genitalis in women         Past Surgical History:  Procedure Laterality Date  . NO PAST SURGERIES     Social History        Socioeconomic History  . Marital status: Married    Spouse name: Not on file  . Number of children: 0  . Years of education: Not on file  . Highest education level: Not on file  Occupational History  . Occupation: college  Social Needs  . Financial resource strain: Not on file  . Food insecurity:     Worry: Not on file    Inability: Not on file  . Transportation needs:    Medical: Not on file    Non-medical: Not on file  Tobacco Use  . Smoking status: Never Smoker  . Smokeless tobacco: Never Used  Substance and Sexual Activity  . Alcohol use: Not Currently    Alcohol/week: 1.0 standard drinks    Types: 1 Standard drinks or equivalent per week  . Drug use: No  . Sexual activity: Yes    Birth control/protection: None  Lifestyle  . Physical activity:    Days per week: Not on file    Minutes per session: Not on file  . Stress: Not on file  Relationships  . Social connections:    Talks on phone: Not on file    Gets together: Not on file    Attends religious service: Not on file    Active member of club or organization: Not on file    Attends meetings of clubs or organizations: Not on file    Relationship status: Not on file  . Intimate partner violence:    Fear of current or ex partner: Not on file    Emotionally abused: Not on file    Physically abused: Not on file    Forced sexual activity: Not on file  Other Topics Concern  . Not on file  Social History Narrative   Regular  exercise:  Yes, walks   Caffeine use:  2 glasses soda daily            No current facility-administered medications on file prior to encounter.          Current Outpatient Medications on File Prior to Encounter  Medication Sig Dispense Refill  . acetaminophen (TYLENOL) 500 MG tablet Take 500 mg by mouth every 6 (six) hours as needed.    Marland Kitchen terconazole (TERAZOL 7) 0.4 % vaginal cream Place 1 applicator vaginally at bedtime for 7 days. 45 g 0        Allergies  Allergen Reactions  . Amoxicillin Anaphylaxis and Swelling    Has patient had a PCN reaction causing immediate rash, facial/tongue/throat swelling, SOB or lightheadedness with hypotension: Yes Has patient had a PCN reaction causing severe rash involving mucus membranes or skin necrosis:  No Has patient had a PCN reaction that required hospitalization No Has patient had a PCN reaction occurring within the last 10 years: No If all of the above answers are "NO", then may proceed with Cephalosporin use.   Marland Kitchen Peanut-Containing Drug Products Anaphylaxis, Hives, Diarrhea, Rash and Other (See Comments)    Puffiness of the eys     I have reviewed patient's Past Medical Hx, Surgical Hx, Family Hx, Social Hx, medications and allergies.   ROS:  Review of Systems  Constitutional: Negative for chills and fever.  Respiratory: Negative for shortness of breath.   Gastrointestinal: Positive for abdominal pain. Negative for constipation and diarrhea.  Genitourinary: Negative for dysuria and vaginal bleeding.   Review of Systems  Other systems negative   Physical Exam  Patient Vitals for the past 24 hrs:  BP Temp Temp src Pulse Resp SpO2 Height Weight  07/29/18 2208 133/77 98.2 F (36.8 C) Oral 74 18 100 % 5\' 4"  (1.626 m) 77.5 kg    Constitutional: Well-developed, well-nourished female in no acute distress.  Cardiovascular: normal rate Respiratory: normal effort GI: Abd soft, tender over right side,  No guarding or rebound. Pos BS x 4 MS: Extremities nontender, no edema, normal ROM Neurologic: Alert and oriented x 4.  GU: Neg CVAT.  PELVIC EXAM: deferred due to recent Methotrexate  LAB RESULTS LabResultsLast24Hours  No results found for this or any previous visit (from the past 24 hour(s)).     IMAGING US Ob Transvaginal  Result Date: 07/30/2018 CLINICAL DATA:  Known right ectopic pregnancy, 5 days post methotrexate. New pelvic pain. EXAM: TRANSVAGINAL OB ULTRASOUND TECHNIQUE: Transvaginal ultrasound was performed for complete evaluation of the gestation as well as the maternal uterus, adnexal regions, and pelvic cul-de-sac. COMPARISON:  Obstetric ultrasound 5 days prior 07/24/2018 FINDINGS: Intrauterine gestational sac: None. Yolk sac:  Not  Visualized. Embryo:  Not Visualized. Maternal uterus/adnexae: Tiny cystic structures in the endometrium all measure less than 4 mm,, slightly diminished in size and increased in number from prior exam. Heterogeneous masslike focus adjacent to the right ovary measures 1.8 x 1.6 x 1.9 cm consistent with known ectopic pregnancy. This previously measured 2.5 x 1.7 x 1.6 cm. The central cystic focus on prior exam persists but is less well appreciated. The adjacent right ovary measures 2.7 x 1.9 x 1.7 cm. The left ovary appears normal. Moderate volume of complex free fluid in the pelvis is new from prior. IMPRESSION: Known right ectopic pregnancy persists and has slightly diminished in size over the past 5 days with persistent 1.9 cm masslike area. There is a new moderate volume of complex free  fluid in the pelvis, which is concerning for interval rupture. Critical Value/emergent results were called by telephone at the time of interpretation on 07/30/2018 at 12:04 am to [facility] , who verbally acknowledged these results. Electronically Signed   By: Keith Rake M.D.   On: 07/30/2018 00:07    MAU Management/MDM: Ordered CBC and [place].  Hemoglobin has dropped slightly  Ref. Range 07/24/2018 17:55  07/29/2018 22:35  Hemoglobin Latest Ref Range: 12.0 - 15.0 g/dL 14.0  12.6   HCGs                                           Day 0              Day 4         Day 6  Ref. Range 07/24/2018 15:58 07/27/18  10:58 07/29/2018 22:35  HCG, Beta Chain, Quant, S Latest Ref Range: <5 mIU/mL 1,873 (H) 1726 (H) 2,520 (H)   Dilaudid 0.5mg  gave good relief from pain Zofran gave good relief from nausea  Discussed with Dr Elonda Husky Will admit for obs over night and recheck CBC in am  WIll start Saline Lock and order Type/Screen Dilaudid and Zofran PRN tonight  If hemoglobin stable in AM, will discharge home   ASSESSMENT 1. Abdominal pain affecting pregnancy   2. Right tubal pregnancy without intrauterine  pregnancy     PLAN  See above Admit for observation Recheck CBC in am at 0600 MD to follow   Hansel Feinstein CNM, MSN Certified Nurse-Midwife 07/29/2018  10:22 PM

## 2018-07-30 NOTE — Discharge Instructions (Signed)
Ectopic Pregnancy  An ectopic pregnancy happens when a fertilized egg grows outside the womb (uterus). The fertilized egg cannot stay alive outside of the womb. This problem often happens in a fallopian tube. It is often caused by damage to the tube. If this problem is found early, you may be treated with medicine that stops the egg from growing. If your tube tears or bursts open (ruptures), you will bleed inside. Often, there is very bad pain in the lower belly. This is an emergency. You will need surgery. Get help right away. Follow these instructions at home: After being treated with medicine or surgery:  Rest and limit your activity for as long as told by your doctor.  Until your doctor says that it is safe: ? Do not lift anything that is heavier than 10 lb (4.5 kg) or the limit that your doctor tells you. ? Avoid exercise and any movement that takes a lot of effort.  To prevent problems when pooping (constipation): ? Eat a healthy diet. This includes:  Fruits.  Vegetables.  Whole grains. ? Drink 6-8 glasses of water a day. Contact a doctor if: Get help right away if:  You have sudden and very bad pain in your belly.  You have very bad pain in your shoulders or neck.  You have pain that gets worse and is not helped by medicine.  You have: ? A fever or chills. ? Vaginal bleeding. ? Redness or swelling at the site of a surgical cut (incision).  You feel sick to your stomach (nauseous) or you throw up (vomit).  You feel dizzy or weak.  You feel light-headed or you pass out (faint). Summary  An ectopic pregnancy happens when a fertilized egg grows outside the womb (uterus).  If this problem is found early, you may be treated with medicine that stops the egg from growing.  If your tube tears or bursts open (ruptures), you will need surgery. This is an emergency. Get help right away. This information is not intended to replace advice given to you by your health care  provider. Make sure you discuss any questions you have with your health care provider. Document Released: 05/31/2008 Document Revised: 03/28/2016 Document Reviewed: 03/28/2016 Elsevier Interactive Patient Education  2019 [employer].

## 2018-08-02 ENCOUNTER — Other Ambulatory Visit: Payer: Self-pay

## 2018-08-02 ENCOUNTER — Inpatient Hospital Stay (HOSPITAL_COMMUNITY)
Admission: AD | Admit: 2018-08-02 | Discharge: 2018-08-02 | Disposition: A | Discharge status: Home / Self Care | Payer: Managed Care, Other (non HMO) | Attending: Obstetrics and Gynecology | Admitting: Obstetrics and Gynecology

## 2018-08-02 DIAGNOSIS — O0281 Inappropriate change in quantitative human chorionic gonadotropin (hCG) in early pregnancy: Secondary | ICD-10-CM

## 2018-08-02 DIAGNOSIS — O00101 Right tubal pregnancy without intrauterine pregnancy: Secondary | ICD-10-CM | POA: Diagnosis present

## 2018-08-02 DIAGNOSIS — Z880 Allergy status to penicillin: Secondary | ICD-10-CM | POA: Diagnosis not present

## 2018-08-02 DIAGNOSIS — Z3A01 Less than 8 weeks gestation of pregnancy: Secondary | ICD-10-CM | POA: Insufficient documentation

## 2018-08-02 LAB — CBC
HCT: 34.4 % — ABNORMAL LOW (ref 36.0–46.0)
Hemoglobin: 11.6 g/dL — ABNORMAL LOW (ref 12.0–15.0)
MCH: 28.2 pg (ref 26.0–34.0)
MCHC: 33.7 g/dL (ref 30.0–36.0)
MCV: 83.7 fL (ref 80.0–100.0)
Platelets: 297 10*3/uL (ref 150–400)
RBC: 4.11 MIL/uL (ref 3.87–5.11)
RDW: 13.1 % (ref 11.5–15.5)
WBC: 7.9 10*3/uL (ref 4.0–10.5)
nRBC: 0 % (ref 0.0–0.2)

## 2018-08-02 LAB — HCG, QUANTITATIVE, PREGNANCY: hCG, Beta Chain, Quant, S: 1807 m[IU]/mL — ABNORMAL HIGH (ref <5)

## 2018-08-02 NOTE — MAU Provider Note (Signed)
History     CSN: [id number]  Arrival date and time: 08/02/18 0741   None    Chief Complaint  Patient presents with  . Labs Only   HPI Holly Thornton is a 28 y.o. G2P1001 at [redacted]w[redacted]d who presents to MAU for follow-up Quant hCG blood work. Her history is significant for right ectopic pregnancy and Methotrexate administration on 07/24/2018. She is s/p overnight observation for severe abdominal pain on 05/13. She denies abdominal pain today but reports light bleeding.  She denies headache, dizziness, weakness or syncope.   OB History    Gravida  2   Para  1   Term  1   Preterm      AB      Living  1     SAB      TAB      Ectopic      Multiple  1   Live Births  1           Past Medical History:  Diagnosis Date  . ADHD (attention deficit hyperactivity disorder)   . Asthma    child  . Colitis    at age 14  . Frequent UTI   . Herpes genitalis in women     Past Surgical History:  Procedure Laterality Date  . NO PAST SURGERIES      Family History  Problem Relation Age of Onset  . Depression Mother   . Fibroids Mother   . Anemia Mother   . Diabetes Maternal Grandmother     Social History   Tobacco Use  . Smoking status: Never Smoker  . Smokeless tobacco: Never Used  Substance Use Topics  . Alcohol use: Not Currently    Alcohol/week: 1.0 standard drinks    Types: 1 Standard drinks or equivalent per week  . Drug use: No    Allergies:  Allergies  Allergen Reactions  . Amoxicillin Anaphylaxis and Swelling    Has patient had a PCN reaction causing immediate rash, facial/tongue/throat swelling, SOB or lightheadedness with hypotension: Yes Has patient had a PCN reaction causing severe rash involving mucus membranes or skin necrosis: No Has patient had a PCN reaction that required hospitalization No Has patient had a PCN reaction occurring within the last 10 years: No If all of the above answers are "NO", then may proceed with Cephalosporin  use.   Marland Kitchen Peanut-Containing Drug Products Anaphylaxis, Hives, Diarrhea, Rash and Other (See Comments)    Puffiness of the eys     No medications prior to admission.    Review of Systems  Gastrointestinal: Negative for abdominal pain.  Genitourinary: Positive for vaginal bleeding.  Neurological: Negative for dizziness, syncope, weakness and light-headedness.  All other systems reviewed and are negative.  Physical Exam   Blood pressure 116/66, pulse 74, temperature 97.9 F (36.6 C), temperature source Oral, resp. rate 18, height 5\' 3"  (1.6 m), weight 78.2 kg, last menstrual period 06/20/2018, SpO2 98 %, unknown if currently breastfeeding.  Physical Exam  Nursing note and vitals reviewed. Constitutional: She is oriented to person, place, and time. She appears well-developed and well-nourished.  Cardiovascular: Normal rate.  Respiratory: Effort normal.  Neurological: She is alert and oriented to person, place, and time.  Skin: Skin is warm and dry.  Psychiatric: She has a normal mood and affect. Her behavior is normal. Judgment and thought content normal.    MAU Course  Procedures  --Patient left hospital grounds while waiting for lab results. She was called by  CNM and offered telephone follow-up based on patient's report of "extreme stress" related to waiting in MAU and "being forced to watch pregnant people come and go". Patient voluntarily returned to MAU to discuss lab results.  Patient Vitals for the past 24 hrs:  BP Temp Temp src Pulse Resp SpO2 Height Weight  08/02/18 0754 116/66 97.9 F (36.6 C) Oral 74 18 98 % 5\' 3"  (1.6 m) 78.2 kg    Results for orders placed or performed during the hospital encounter of 08/02/18 (from the past 24 hour(s))  hCG, quantitative, pregnancy     Status: Abnormal   Collection Time: 08/02/18  7:54 AM  Result Value Ref Range   hCG, Beta Chain, Quant, S 1,807 (H) <5 mIU/mL  CBC     Status: Abnormal   Collection Time: 08/02/18  7:54 AM   Result Value Ref Range   WBC 7.9 4.0 - 10.5 K/uL   RBC 4.11 3.87 - 5.11 MIL/uL   Hemoglobin 11.6 (L) 12.0 - 15.0 g/dL   HCT 34.4 (L) 36.0 - 46.0 %   MCV 83.7 80.0 - 100.0 fL   MCH 28.2 26.0 - 34.0 pg   MCHC 33.7 30.0 - 36.0 g/dL   RDW 13.1 11.5 - 15.5 %   Platelets 297 150 - 400 K/uL   nRBC 0.0 0.0 - 0.2 %    Assessment and Plan  --28 y.o. G2P1001  --S/p Methotrexate 07/24/18 --Appropriate decrease in quant hCG --Discharge home in stable condition with bleeding/pain precautions  F/U: Plan repeat non-stat Quant hCG 05/25 at [facility]. Message sent to clinic to coordinate  Darlina Rumpf, [place] 08/02/2018, 10:36 AM

## 2018-08-02 NOTE — MAU Note (Signed)
Pt reporting to mau for repeat hcg labs.  Pt denies any pain at this time, but reports some light bleeding.

## 2018-08-17 ENCOUNTER — Telehealth: Payer: Self-pay | Admitting: Obstetrics and Gynecology

## 2018-08-17 ENCOUNTER — Encounter: Payer: Self-pay | Admitting: Obstetrics and Gynecology

## 2018-08-17 NOTE — Telephone Encounter (Signed)
Sent patient a letter to follow up with the office. Was not able to reach her via telephone.

## 2018-12-04 ENCOUNTER — Other Ambulatory Visit: Payer: Self-pay

## 2018-12-04 ENCOUNTER — Encounter (HOSPITAL_COMMUNITY): Payer: Self-pay | Admitting: *Deleted

## 2018-12-04 ENCOUNTER — Inpatient Hospital Stay (HOSPITAL_COMMUNITY)
Admission: AD | Admit: 2018-12-04 | Discharge: 2018-12-04 | Disposition: A | Discharge status: Home / Self Care | Payer: Managed Care, Other (non HMO) | Attending: Obstetrics and Gynecology | Admitting: Obstetrics and Gynecology

## 2018-12-04 DIAGNOSIS — Z832 Family history of diseases of the blood and blood-forming organs and certain disorders involving the immune mechanism: Secondary | ICD-10-CM | POA: Diagnosis not present

## 2018-12-04 DIAGNOSIS — Z880 Allergy status to penicillin: Secondary | ICD-10-CM | POA: Insufficient documentation

## 2018-12-04 DIAGNOSIS — Z3A01 Less than 8 weeks gestation of pregnancy: Secondary | ICD-10-CM | POA: Insufficient documentation

## 2018-12-04 DIAGNOSIS — R112 Nausea with vomiting, unspecified: Secondary | ICD-10-CM | POA: Diagnosis present

## 2018-12-04 DIAGNOSIS — Z833 Family history of diabetes mellitus: Secondary | ICD-10-CM | POA: Insufficient documentation

## 2018-12-04 DIAGNOSIS — O219 Vomiting of pregnancy, unspecified: Secondary | ICD-10-CM | POA: Diagnosis not present

## 2018-12-04 HISTORY — DX: Unspecified ectopic pregnancy without intrauterine pregnancy: O00.90

## 2018-12-04 LAB — URINALYSIS, ROUTINE W REFLEX MICROSCOPIC
Bilirubin Urine: NEGATIVE
Glucose, UA: NEGATIVE mg/dL
Hgb urine dipstick: NEGATIVE
Ketones, ur: 5 mg/dL — AB
Nitrite: NEGATIVE
Protein, ur: 30 mg/dL — AB
Specific Gravity, Urine: 1.033 — ABNORMAL HIGH (ref 1.005–1.030)
pH: 5 (ref 5.0–8.0)

## 2018-12-04 LAB — POCT PREGNANCY, URINE: Preg Test, Ur: POSITIVE — AB

## 2018-12-04 MED ORDER — FAMOTIDINE 20 MG PO TABS: 20.0000 mg | ORAL_TABLET | Freq: Two times a day (BID) | ORAL | 0 refills | Status: DC

## 2018-12-04 MED ORDER — PROMETHAZINE HCL 25 MG/ML IJ SOLN
25.0000 mg | Freq: Once | INTRAMUSCULAR | Status: AC
  Administered 2018-12-04: 25 mg via INTRAVENOUS
  Filled 2018-12-04: qty 1

## 2018-12-04 MED ORDER — FAMOTIDINE IN NACL 20-0.9 MG/50ML-% IV SOLN
20.0000 mg | Freq: Once | INTRAVENOUS | Status: AC
  Administered 2018-12-04: 20 mg via INTRAVENOUS
  Filled 2018-12-04: qty 50

## 2018-12-04 MED ORDER — DOXYLAMINE-PYRIDOXINE 10-10 MG PO TBEC: DELAYED_RELEASE_TABLET | ORAL | 0 refills | Status: DC

## 2018-12-04 MED ORDER — METOCLOPRAMIDE HCL 10 MG PO TABS: 10.0000 mg | ORAL_TABLET | Freq: Three times a day (TID) | ORAL | 0 refills | Status: DC | PRN

## 2018-12-04 MED ORDER — LACTATED RINGERS IV BOLUS
1000.0000 mL | Freq: Once | INTRAVENOUS | Status: AC
  Administered 2018-12-04: 17:00:00 1000 mL via INTRAVENOUS

## 2018-12-04 NOTE — Discharge Instructions (Signed)

## 2018-12-04 NOTE — MAU Note (Signed)
.   Holly Thornton is a 28 y.o. at [redacted]w[redacted]d here in MAU reporting: morning sickness on and off for a couple of weeks. Pt has had her 1st OB appointment at [facility]. Denies any pain or vaginal bleeding LMP: 10/15/18 Onset of complaint: ongoing with pregnancy Pain score: 0 Vitals:   12/04/18 1438  BP: 115/66  Pulse: (!) 103  Resp: 16  Temp: 98.2 F (36.8 C)  SpO2: 100%      Lab orders placed from triage: UA/UPT

## 2018-12-04 NOTE — MAU Provider Note (Signed)
Chief Complaint: Morning Sickness   First Provider Initiated Contact with Patient 12/04/18 1614     SUBJECTIVE HPI: Holly Thornton is a 28 y.o. G3P1011 at [redacted]w[redacted]d who presents to Maternity Admissions reporting nausea & vomiting. Symptoms have worsened over the last week. States she vomits 10+ times per day and hasn't been able to keep down real food in over a week. Has been taking unisom without relief. Tried vitamin b6 but was unable to keep it down. Was unable to keep down watermelon & water today. Has been able to keep down noodle cups & some sprite. Denies abdominal pain, diarrhea, fever, or vaginal bleeding. Goes to [facility] for prenatal care.    Past Medical History:  Diagnosis Date  . ADHD (attention deficit hyperactivity disorder)   . Asthma    child  . Colitis    at age 5  . Ectopic pregnancy   . Frequent UTI   . Herpes genitalis in women    OB History  Gravida Para Term Preterm AB Living  3 1 1   1 1   SAB TAB Ectopic Multiple Live Births      1 1 1     # Outcome Date GA Lbr Len/2nd Weight Sex Delivery Anes PTL Lv  3 Current           2 Ectopic 07/2018          1A Term 02/10/15 [redacted]w[redacted]d 07:47 / 01:49 3150 g F Vag-Spont EPI  LIV  1B Term            Past Surgical History:  Procedure Laterality Date  . NO PAST SURGERIES     Social History   Socioeconomic History  . Marital status: Married    Spouse name: Not on file  . Number of children: 0  . Years of education: Not on file  . Highest education level: Not on file  Occupational History  . Occupation: college  Social Needs  . Financial resource strain: Not on file  . Food insecurity    Worry: Not on file    Inability: Not on file  . Transportation needs    Medical: Not on file    Non-medical: Not on file  Tobacco Use  . Smoking status: Never Smoker  . Smokeless tobacco: Never Used  Substance and Sexual Activity  . Alcohol use: Not Currently    Alcohol/week: 1.0 standard drinks    Types: 1 Standard drinks or  equivalent per week  . Drug use: No  . Sexual activity: Yes    Birth control/protection: None  Lifestyle  . Physical activity    Days per week: Not on file    Minutes per session: Not on file  . Stress: Not on file  Relationships  . Social Herbalist on phone: Not on file    Gets together: Not on file    Attends religious service: Not on file    Active member of club or organization: Not on file    Attends meetings of clubs or organizations: Not on file    Relationship status: Not on file  . Intimate partner violence    Fear of current or ex partner: Not on file    Emotionally abused: Not on file    Physically abused: Not on file    Forced sexual activity: Not on file  Other Topics Concern  . Not on file  Social History Narrative   Regular exercise:  Yes, walks   Caffeine use:  2 glasses soda daily            Family History  Problem Relation Age of Onset  . Depression Mother   . Fibroids Mother   . Anemia Mother   . Diabetes Maternal Grandmother    No current facility-administered medications on file prior to encounter.    Current Outpatient Medications on File Prior to Encounter  Medication Sig Dispense Refill  . acetaminophen (TYLENOL) 500 MG tablet Take 500 mg by mouth every 6 (six) hours as needed.    Marland Kitchen ketorolac (TORADOL) 10 MG tablet Take 1 tablet (10 mg total) by mouth every 8 (eight) hours as needed. 15 tablet 0   Allergies  Allergen Reactions  . Amoxicillin Anaphylaxis and Swelling    Has patient had a PCN reaction causing immediate rash, facial/tongue/throat swelling, SOB or lightheadedness with hypotension: Yes Has patient had a PCN reaction causing severe rash involving mucus membranes or skin necrosis: No Has patient had a PCN reaction that required hospitalization No Has patient had a PCN reaction occurring within the last 10 years: No If all of the above answers are "NO", then may proceed with Cephalosporin use.   Marland Kitchen Peanut-Containing Drug  Products Anaphylaxis, Hives, Diarrhea, Rash and Other (See Comments)    Puffiness of the eys     I have reviewed patient's Past Medical Hx, Surgical Hx, Family Hx, Social Hx, medications and allergies.   Review of Systems  Constitutional: Negative.   Gastrointestinal: Positive for nausea and vomiting. Negative for abdominal pain, constipation and diarrhea.  Genitourinary: Negative.     OBJECTIVE Patient Vitals for the past 24 hrs:  BP Temp Pulse Resp SpO2  12/04/18 1850 123/80 - - - -  12/04/18 1438 115/66 98.2 F (36.8 C) (!) 103 16 100 %   Constitutional: Well-developed, well-nourished female in no acute distress.  Cardiovascular: normal rate & rhythm, no murmur Respiratory: normal rate and effort. Lung sounds clear throughout GI: Abd soft, non-tender, Pos BS x 4. No guarding or rebound tenderness MS: Extremities nontender, no edema, normal ROM Neurologic: Alert and oriented x 4.      LAB RESULTS Results for orders placed or performed during the hospital encounter of 12/04/18 (from the past 24 hour(s))  Urinalysis, Routine w reflex microscopic     Status: Abnormal   Collection Time: 12/04/18  2:43 PM  Result Value Ref Range   Color, Urine AMBER (A) YELLOW   APPearance CLOUDY (A) CLEAR   Specific Gravity, Urine 1.033 (H) 1.005 - 1.030   pH 5.0 5.0 - 8.0   Glucose, UA NEGATIVE NEGATIVE mg/dL   Hgb urine dipstick NEGATIVE NEGATIVE   Bilirubin Urine NEGATIVE NEGATIVE   Ketones, ur 5 (A) NEGATIVE mg/dL   Protein, ur 30 (A) NEGATIVE mg/dL   Nitrite NEGATIVE NEGATIVE   Leukocytes,Ua SMALL (A) NEGATIVE   RBC / HPF 0-5 0 - 5 RBC/hpf   WBC, UA 0-5 0 - 5 WBC/hpf   Bacteria, UA MANY (A) NONE SEEN   Squamous Epithelial / LPF 21-50 0 - 5   Mucus PRESENT   Pregnancy, urine POC     Status: Abnormal   Collection Time: 12/04/18  2:46 PM  Result Value Ref Range   Preg Test, Ur POSITIVE (A) NEGATIVE    IMAGING No results found.  MAU COURSE Orders Placed This Encounter   Procedures  . Urinalysis, Routine w reflex microscopic  . Pregnancy, urine POC  . Discharge patient   Meds ordered this encounter  Medications  .  lactated ringers bolus 1,000 mL  . promethazine (PHENERGAN) injection 25 mg  . famotidine (PEPCID) IVPB 20 mg premix  . metoCLOPramide (REGLAN) 10 MG tablet    Sig: Take 1 tablet (10 mg total) by mouth every 8 (eight) hours as needed for nausea.    Dispense:  60 tablet    Refill:  0    Order Specific Question:   Supervising Provider    Answer:   CONSTANT, PEGGY [[id number]]  . famotidine (PEPCID) 20 MG tablet    Sig: Take 1 tablet (20 mg total) by mouth 2 (two) times daily.    Dispense:  60 tablet    Refill:  0    Order Specific Question:   Supervising Provider    Answer:   CONSTANT, PEGGY [[id number]]  . Doxylamine-Pyridoxine 10-10 MG TBEC    Sig: Start with 2 tablets every evening. If symptoms persist, add 1 tablet every morning. If symptoms persist, add 1 tablet at mid-day.    Dispense:  60 tablet    Refill:  0    Order Specific Question:   Supervising Provider    Answer:   CONSTANT, PEGGY [[id number]]    MDM  U/a shows high SG & some ketones. Will give IV fluids, phenergan, & pepcid.  Pt reports improvement in symptoms & hasn't vomited while in MAU  ASSESSMENT 1. Nausea and vomiting during pregnancy prior to [redacted] weeks gestation     PLAN Discharge home in stable condition. Discussed reasons to return to MAU Rx diclegis, reglan, & pepcid  Follow-up [facility] Follow up.   Specialty: Obstetrics and Gynecology Contact information: [address] [place] [phone]       [facility] Follow up.   Specialty: Obstetrics and Gynecology Why: return for worsening symptoms Contact information: [address] [id number] [place] [phone]         Allergies as of 12/04/2018       Reactions   Amoxicillin Anaphylaxis, Swelling   Has patient had a PCN reaction causing immediate rash, facial/tongue/throat swelling, SOB or lightheadedness with hypotension: Yes Has patient had a PCN reaction causing severe rash involving mucus membranes or skin necrosis: No Has patient had a PCN reaction that required hospitalization No Has patient had a PCN reaction occurring within the last 10 years: No If all of the above answers are "NO", then may proceed with Cephalosporin use.   Peanut-containing Drug Products Anaphylaxis, Hives, Diarrhea, Rash, Other (See Comments)   Puffiness of the eys       Medication List    STOP taking these medications   ketorolac 10 MG tablet Commonly known as: TORADOL     TAKE these medications   acetaminophen 500 MG tablet Commonly known as: TYLENOL Take 500 mg by mouth every 6 (six) hours as needed.   Doxylamine-Pyridoxine 10-10 MG Tbec Start with 2 tablets every evening. If symptoms persist, add 1 tablet every morning. If symptoms persist, add 1 tablet at mid-day.   famotidine 20 MG tablet Commonly known as: PEPCID Take 1 tablet (20 mg total) by mouth 2 (two) times daily.   metoCLOPramide 10 MG tablet Commonly known as: REGLAN Take 1 tablet (10 mg total) by mouth every 8 (eight) hours as needed for nausea.        Jorje Guild, NP 12/04/2018  6:57 PM

## 2018-12-09 ENCOUNTER — Encounter (HOSPITAL_COMMUNITY): Payer: Self-pay

## 2018-12-09 ENCOUNTER — Other Ambulatory Visit: Payer: Self-pay

## 2018-12-09 ENCOUNTER — Inpatient Hospital Stay (HOSPITAL_COMMUNITY)
Admission: AD | Admit: 2018-12-09 | Discharge: 2018-12-09 | Disposition: A | Discharge status: Home / Self Care | Payer: Managed Care, Other (non HMO) | Attending: Obstetrics and Gynecology | Admitting: Obstetrics and Gynecology

## 2018-12-09 DIAGNOSIS — O219 Vomiting of pregnancy, unspecified: Secondary | ICD-10-CM

## 2018-12-09 DIAGNOSIS — J45909 Unspecified asthma, uncomplicated: Secondary | ICD-10-CM | POA: Diagnosis not present

## 2018-12-09 DIAGNOSIS — Z3A01 Less than 8 weeks gestation of pregnancy: Secondary | ICD-10-CM | POA: Diagnosis not present

## 2018-12-09 DIAGNOSIS — O99511 Diseases of the respiratory system complicating pregnancy, first trimester: Secondary | ICD-10-CM | POA: Insufficient documentation

## 2018-12-09 LAB — URINALYSIS, ROUTINE W REFLEX MICROSCOPIC
Bacteria, UA: NONE SEEN
Bilirubin Urine: NEGATIVE
Glucose, UA: NEGATIVE mg/dL
Hgb urine dipstick: NEGATIVE
Ketones, ur: 80 mg/dL — AB
Leukocytes,Ua: NEGATIVE
Nitrite: NEGATIVE
Protein, ur: 30 mg/dL — AB
Specific Gravity, Urine: 1.032 — ABNORMAL HIGH (ref 1.005–1.030)
pH: 5 (ref 5.0–8.0)

## 2018-12-09 MED ORDER — ONDANSETRON 4 MG PO TBDP: 4.0000 mg | ORAL_TABLET | Freq: Three times a day (TID) | ORAL | 0 refills | Status: DC | PRN

## 2018-12-09 MED ORDER — ONDANSETRON 4 MG PO TBDP
4.0000 mg | ORAL_TABLET | Freq: Once | ORAL | Status: AC
  Administered 2018-12-09: 4 mg via ORAL
  Filled 2018-12-09: qty 1

## 2018-12-09 MED ORDER — PROCHLORPERAZINE MALEATE 10 MG PO TABS: 10.0000 mg | ORAL_TABLET | Freq: Three times a day (TID) | ORAL | 0 refills | Status: DC | PRN

## 2018-12-09 MED ORDER — LACTATED RINGERS IV BOLUS
1000.0000 mL | Freq: Once | INTRAVENOUS | Status: AC
  Administered 2018-12-09: 1000 mL via INTRAVENOUS

## 2018-12-09 MED ORDER — PROCHLORPERAZINE MALEATE 10 MG PO TABS
10.0000 mg | ORAL_TABLET | Freq: Once | ORAL | Status: AC
  Administered 2018-12-09: 10 mg via ORAL
  Filled 2018-12-09: qty 1

## 2018-12-09 NOTE — MAU Note (Signed)
Was here last Friday, vomiting continues.  Medicine, doesn't seem to be helping.  When stepped on scale, said she has lost 10 #

## 2018-12-09 NOTE — MAU Provider Note (Addendum)
History     CSN: [id number]  Arrival date and time: 12/09/18 [id number]   First Provider Initiated Contact with Patient 12/09/18 1940      Chief Complaint  Patient presents with  . Emesis   Ms. Holly Thornton is a 28 y.o. G3P1011 at [redacted]w[redacted]d who presents to MAU for N/V.   Onset: 2 weeks ago, worse this week Location: stomach Duration: 2 weeks Character: constant nausea, vomits on average 10x per day, pt last ate a few grapes this morning, tried some water and gatorade unsuccessfully Aggravating/Associated: none/none Relieving: none Treatment: VitB6 - did not work, Reglan - provides relief for an hour  Pt denies VB, LOF, ctx, decreased FM, vaginal discharge/odor/itching. Pt denies abdominal pain, constipation, diarrhea, or urinary problems. Pt denies fever, chills, fatigue, sweating or changes in appetite. Pt denies SOB or chest pain. Pt denies dizziness, HA, light-headedness, weakness.  Problems this pregnancy include: none. Allergies? AMOX, peanuts Current medications/supplements? PNVs Prenatal care provider? [facility], next appt 12/17/2018   OB History    Gravida  3   Para  1   Term  1   Preterm      AB  1   Living  1     SAB      TAB      Ectopic  1   Multiple  1   Live Births  1           Past Medical History:  Diagnosis Date  . ADHD (attention deficit hyperactivity disorder)   . Asthma    child  . Colitis    at age 54  . Ectopic pregnancy   . Frequent UTI   . Herpes genitalis in women     Past Surgical History:  Procedure Laterality Date  . NO PAST SURGERIES      Family History  Problem Relation Age of Onset  . Depression Mother   . Fibroids Mother   . Anemia Mother   . Diabetes Maternal Grandmother     Social History   Tobacco Use  . Smoking status: Never Smoker  . Smokeless tobacco: Never Used  Substance Use Topics  . Alcohol use: Not Currently    Alcohol/week: 1.0 standard drinks    Types: 1 Standard drinks or equivalent  per week  . Drug use: No    Allergies:  Allergies  Allergen Reactions  . Amoxicillin Anaphylaxis and Swelling    Has patient had a PCN reaction causing immediate rash, facial/tongue/throat swelling, SOB or lightheadedness with hypotension: Yes Has patient had a PCN reaction causing severe rash involving mucus membranes or skin necrosis: No Has patient had a PCN reaction that required hospitalization No Has patient had a PCN reaction occurring within the last 10 years: No If all of the above answers are "NO", then may proceed with Cephalosporin use.   Marland Kitchen Peanut-Containing Drug Products Anaphylaxis, Hives, Diarrhea, Rash and Other (See Comments)    Puffiness of the eys     Medications Prior to Admission  Medication Sig Dispense Refill Last Dose  . metoCLOPramide (REGLAN) 10 MG tablet Take 1 tablet (10 mg total) by mouth every 8 (eight) hours as needed for nausea. 60 tablet 0 12/09/2018 at Unknown time  . acetaminophen (TYLENOL) 500 MG tablet Take 500 mg by mouth every 6 (six) hours as needed.   Given By EMS at Unknown time  . Doxylamine-Pyridoxine 10-10 MG TBEC Start with 2 tablets every evening. If symptoms persist, add 1 tablet every morning. If symptoms  persist, add 1 tablet at mid-day. 60 tablet 0   . famotidine (PEPCID) 20 MG tablet Take 1 tablet (20 mg total) by mouth 2 (two) times daily. 60 tablet 0     Review of Systems  Constitutional: Negative for chills, diaphoresis, fatigue and fever.  Eyes: Negative for visual disturbance.  Respiratory: Negative for shortness of breath.   Cardiovascular: Negative for chest pain.  Gastrointestinal: Positive for nausea and vomiting. Negative for abdominal pain, constipation and diarrhea.  Genitourinary: Negative for dysuria, flank pain, frequency, pelvic pain, urgency, vaginal bleeding and vaginal discharge.  Neurological: Negative for dizziness, weakness, light-headedness and headaches.   Physical Exam   Blood pressure 119/81, pulse (!)  111, temperature 98.9 F (37.2 C), temperature source Oral, resp. rate 16, weight 70.2 kg, last menstrual period 10/15/2018, SpO2 99 %, unknown if currently breastfeeding.  Patient Vitals for the past 24 hrs:  BP Temp Temp src Pulse Resp SpO2 Weight  12/09/18 1858 119/81 98.9 F (37.2 C) Oral (!) 111 16 99 % 70.2 kg   Physical Exam  Constitutional: She is oriented to person, place, and time. She appears well-developed and well-nourished. No distress.  HENT:  Head: Normocephalic and atraumatic.  Respiratory: Effort normal.  GI: Soft. She exhibits no distension and no mass. There is no abdominal tenderness. There is no rebound and no guarding.  Neurological: She is alert and oriented to person, place, and time.  Skin: Skin is warm and dry. She is not diaphoretic.  Psychiatric: She has a normal mood and affect. Her behavior is normal. Judgment and thought content normal.   Results for orders placed or performed during the hospital encounter of 12/09/18 (from the past 24 hour(s))  Urinalysis, Routine w reflex microscopic     Status: Abnormal   Collection Time: 12/09/18  7:30 PM  Result Value Ref Range   Color, Urine AMBER (A) YELLOW   APPearance HAZY (A) CLEAR   Specific Gravity, Urine 1.032 (H) 1.005 - 1.030   pH 5.0 5.0 - 8.0   Glucose, UA NEGATIVE NEGATIVE mg/dL   Hgb urine dipstick NEGATIVE NEGATIVE   Bilirubin Urine NEGATIVE NEGATIVE   Ketones, ur 80 (A) NEGATIVE mg/dL   Protein, ur 30 (A) NEGATIVE mg/dL   Nitrite NEGATIVE NEGATIVE   Leukocytes,Ua NEGATIVE NEGATIVE   RBC / HPF 0-5 0 - 5 RBC/hpf   WBC, UA 0-5 0 - 5 WBC/hpf   Bacteria, UA NONE SEEN NONE SEEN   Squamous Epithelial / LPF 11-20 0 - 5   Mucus PRESENT    Hyaline Casts, UA PRESENT      MAU Course  Procedures  MDM -care transferred to Dr. Darene Lamer @2000 , UA pending, no med orders entered Nugent, Gerrie Nordmann, NP  8:00 PM 12/09/2018  Care initiated at 2000 hours, after report received from [place]. -UA  consistent with dehydration. Ketones 80, 1L bolus LR ordered -Compazine 10 mg PO ordered -PO challenge 30 minutes after compazine.  Upon reassessment, patient feels much better after 1L bolus and compazine. She has kept down graham crackers, and ice chips.  Assessment and Plan   28 yo G3P1011 at 7.6 EGA presenting with nausea and vomiting of pregnancy. -Diclegis and Reglan did not help -Ketones 80 on UA, consistent with dehydration, given 1 L bolus -Nausea much improved with compazine -Zofran ordered as well -PO challenge successful -Return precautions given -Advised follow up with her OB next week as scheduled -Compazine and Zofran sent to pharmacy  Desere Gwin, [place], DO OB  Fellow, [facility] 12/09/2018 10:49 PM

## 2018-12-09 NOTE — Discharge Instructions (Signed)
Morning Sickness  Morning sickness is when you feel sick to your stomach (nauseous) during pregnancy. You may feel sick to your stomach and throw up (vomit). You may feel sick in the morning, but you can feel this way at any time of day. Some women feel very sick to their stomach and cannot stop throwing up (hyperemesis gravidarum). Follow these instructions at home: Medicines  Take over-the-counter and prescription medicines only as told by your doctor. Do not take any medicines until you talk with your doctor about them first.  Taking multivitamins before getting pregnant can stop or lessen the harshness of morning sickness. Eating and drinking  Eat dry toast or crackers before getting out of bed.  Eat 5 or 6 small meals a day.  Eat dry and bland foods like rice and baked potatoes.  Do not eat greasy, fatty, or spicy foods.  Have someone cook for you if the smell of food causes you to feel sick or throw up.  If you feel sick to your stomach after taking prenatal vitamins, take them at night or with a snack.  Eat protein when you need a snack. Nuts, yogurt, and cheese are good choices.  Drink fluids throughout the day.  Try ginger ale made with real ginger, ginger tea made from fresh grated ginger, or ginger candies. General instructions  Do not use any products that have nicotine or tobacco in them, such as cigarettes and e-cigarettes. If you need help quitting, ask your doctor.  Use an air purifier to keep the air in your house free of smells.  Get lots of fresh air.  Try to avoid smells that make you feel sick.  Try: ? Wearing a bracelet that is used for seasickness (acupressure wristband). ? Going to a doctor who puts thin needles into certain body points (acupuncture) to improve how you feel. Contact a doctor if:  You need medicine to feel better.  You feel dizzy or light-headed.  You are losing weight. Get help right away if:  You feel very sick to your  stomach and cannot stop throwing up.  You pass out (faint).  You have very bad pain in your belly. Summary  Morning sickness is when you feel sick to your stomach (nauseous) during pregnancy.  You may feel sick in the morning, but you can feel this way at any time of day.  Making some changes to what you eat may help your symptoms go away. This information is not intended to replace advice given to you by your health care provider. Make sure you discuss any questions you have with your health care provider. Document Released: 04/11/2004 Document Revised: 02/14/2017 Document Reviewed: 04/04/2016 Elsevier Patient Education  2020 [employer]  

## 2018-12-13 ENCOUNTER — Inpatient Hospital Stay (HOSPITAL_COMMUNITY)
Admission: AD | Admit: 2018-12-13 | Discharge: 2018-12-13 | Disposition: A | Discharge status: Home / Self Care | Payer: Managed Care, Other (non HMO) | Attending: Obstetrics and Gynecology | Admitting: Obstetrics and Gynecology

## 2018-12-13 ENCOUNTER — Other Ambulatory Visit: Payer: Self-pay

## 2018-12-13 ENCOUNTER — Encounter (HOSPITAL_COMMUNITY): Payer: Self-pay | Admitting: *Deleted

## 2018-12-13 DIAGNOSIS — J45909 Unspecified asthma, uncomplicated: Secondary | ICD-10-CM | POA: Diagnosis not present

## 2018-12-13 DIAGNOSIS — O219 Vomiting of pregnancy, unspecified: Secondary | ICD-10-CM | POA: Diagnosis present

## 2018-12-13 DIAGNOSIS — O26891 Other specified pregnancy related conditions, first trimester: Secondary | ICD-10-CM | POA: Diagnosis not present

## 2018-12-13 DIAGNOSIS — F1210 Cannabis abuse, uncomplicated: Secondary | ICD-10-CM

## 2018-12-13 DIAGNOSIS — Z3A08 8 weeks gestation of pregnancy: Secondary | ICD-10-CM | POA: Insufficient documentation

## 2018-12-13 DIAGNOSIS — B009 Herpesviral infection, unspecified: Secondary | ICD-10-CM | POA: Diagnosis not present

## 2018-12-13 DIAGNOSIS — O98511 Other viral diseases complicating pregnancy, first trimester: Secondary | ICD-10-CM | POA: Diagnosis not present

## 2018-12-13 DIAGNOSIS — Z79899 Other long term (current) drug therapy: Secondary | ICD-10-CM | POA: Insufficient documentation

## 2018-12-13 DIAGNOSIS — R109 Unspecified abdominal pain: Secondary | ICD-10-CM | POA: Insufficient documentation

## 2018-12-13 DIAGNOSIS — O99511 Diseases of the respiratory system complicating pregnancy, first trimester: Secondary | ICD-10-CM | POA: Insufficient documentation

## 2018-12-13 LAB — URINALYSIS, ROUTINE W REFLEX MICROSCOPIC
Bilirubin Urine: NEGATIVE
Glucose, UA: NEGATIVE mg/dL
Hgb urine dipstick: NEGATIVE
Ketones, ur: NEGATIVE mg/dL
Leukocytes,Ua: NEGATIVE
Nitrite: NEGATIVE
Protein, ur: NEGATIVE mg/dL
Specific Gravity, Urine: 1.023 (ref 1.005–1.030)
pH: 7 (ref 5.0–8.0)

## 2018-12-13 LAB — RAPID URINE DRUG SCREEN, HOSP PERFORMED
Amphetamines: NOT DETECTED
Barbiturates: NOT DETECTED
Benzodiazepines: NOT DETECTED
Cocaine: NOT DETECTED
Opiates: NOT DETECTED
Tetrahydrocannabinol: POSITIVE — AB

## 2018-12-13 MED ORDER — PROMETHAZINE HCL 25 MG PO TABS: 25.0000 mg | ORAL_TABLET | Freq: Four times a day (QID) | ORAL | 0 refills | Status: DC | PRN

## 2018-12-13 MED ORDER — PROMETHAZINE HCL 25 MG/ML IJ SOLN
25.0000 mg | Freq: Once | INTRAMUSCULAR | Status: AC
  Administered 2018-12-13: 25 mg via INTRAVENOUS
  Filled 2018-12-13: qty 1

## 2018-12-13 MED ORDER — LACTATED RINGERS IV SOLN
Freq: Once | INTRAVENOUS | Status: AC
  Administered 2018-12-13: 12:00:00 via INTRAVENOUS

## 2018-12-13 NOTE — MAU Provider Note (Signed)
History     CSN: [id number]  Arrival date and time: 12/13/18 1126   First Provider Initiated Contact with Patient 12/13/18 1153      No chief complaint on file.  HPI Holly Thornton is a 28 y.o. G3P1011 at [redacted]w[redacted]d who presents to MAU with chief complaint or recurrent "projectile" nausea and vomiting. Patient was most recently evaluated in MAU on 12/09/18. She was discharged with Compazine and Zofran but states it is not working. She endorses vomiting "non-stop" last night. She felt marginally better this morning so tried a fruit snack and a few salt and vinegar potato chips. Then she had to leave church early due to severe nausea.  Patient reports epigastric and low back pain related to episodes of vomiting. She rates her pain as 7/10, non-radiating. Residual soreness when not vomiting.   OB History    Gravida  3   Para  1   Term  1   Preterm      AB  1   Living  1     SAB      TAB      Ectopic  1   Multiple  1   Live Births  1           Past Medical History:  Diagnosis Date  . ADHD (attention deficit hyperactivity disorder)   . Asthma    child  . Colitis    at age 57  . Ectopic pregnancy   . Frequent UTI   . Herpes genitalis in women     Past Surgical History:  Procedure Laterality Date  . NO PAST SURGERIES      Family History  Problem Relation Age of Onset  . Depression Mother   . Fibroids Mother   . Anemia Mother   . Diabetes Maternal Grandmother     Social History   Tobacco Use  . Smoking status: Never Smoker  . Smokeless tobacco: Never Used  Substance Use Topics  . Alcohol use: Not Currently    Alcohol/week: 1.0 standard drinks    Types: 1 Standard drinks or equivalent per week  . Drug use: No    Allergies:  Allergies  Allergen Reactions  . Amoxicillin Anaphylaxis and Swelling    Has patient had a PCN reaction causing immediate rash, facial/tongue/throat swelling, SOB or lightheadedness with hypotension: Yes Has patient had  a PCN reaction causing severe rash involving mucus membranes or skin necrosis: No Has patient had a PCN reaction that required hospitalization No Has patient had a PCN reaction occurring within the last 10 years: No If all of the above answers are "NO", then may proceed with Cephalosporin use.   Marland Kitchen Peanut-Containing Drug Products Anaphylaxis, Hives, Diarrhea, Rash and Other (See Comments)    Puffiness of the eys     Medications Prior to Admission  Medication Sig Dispense Refill Last Dose  . acetaminophen (TYLENOL) 500 MG tablet Take 500 mg by mouth every 6 (six) hours as needed.     . famotidine (PEPCID) 20 MG tablet Take 1 tablet (20 mg total) by mouth 2 (two) times daily. 60 tablet 0   . ondansetron (ZOFRAN ODT) 4 MG disintegrating tablet Take 1 tablet (4 mg total) by mouth every 8 (eight) hours as needed for nausea or vomiting. 90 tablet 0   . prochlorperazine (COMPAZINE) 10 MG tablet Take 1 tablet (10 mg total) by mouth every 8 (eight) hours as needed for nausea or vomiting. 90 tablet 0  Review of Systems  Constitutional: Negative for fatigue and fever.  Respiratory: Negative for shortness of breath.   Gastrointestinal: Positive for abdominal pain, nausea and vomiting.  Genitourinary: Negative for decreased urine volume, difficulty urinating, dyspareunia, dysuria, flank pain, vaginal bleeding, vaginal discharge and vaginal pain.  Musculoskeletal: Positive for back pain.  Neurological: Negative for dizziness, syncope and weakness.  All other systems reviewed and are negative.  Physical Exam   Blood pressure 125/89, pulse 89, temperature 98.3 F (36.8 C), temperature source Oral, resp. rate 16, height 5\' 4"  (1.626 m), weight 71.9 kg, last menstrual period 10/15/2018, SpO2 100 %, unknown if currently breastfeeding.  Physical Exam  Nursing note and vitals reviewed. Constitutional: She is oriented to person, place, and time. She appears well-developed and well-nourished.   Cardiovascular: Normal rate and normal pulses.  Respiratory: Effort normal and breath sounds normal. No respiratory distress.  GI: Soft. She exhibits no distension. There is no abdominal tenderness. There is no rebound and no guarding.  Genitourinary:    Genitourinary Comments: Not evaluated based on chief complaint   Musculoskeletal: Normal range of motion.  Neurological: She is alert and oriented to person, place, and time. She has normal strength. No sensory deficit.  Skin: Skin is warm and dry. She is not diaphoretic. No pallor.  Psychiatric: She has a normal mood and affect. Her behavior is normal. Judgment and thought content normal.    MAU Course/MDM  Procedures  --Discussed food selection as primary intervention for nausea and vomiting. Encouraged patient and FOB to remove strong-flavored, fast foods from diet. Focus on simple bland carbs e.g saltine crackers and slow eating.  --Denies THC use.  --No concerning findings on UA, physical exam unremarkable  Patient Vitals for the past 24 hrs:  BP Temp Temp src Pulse Resp SpO2 Height Weight  12/13/18 1359 116/72 - - 89 16 100 % - -  12/13/18 1139 125/89 98.3 F (36.8 C) Oral 89 16 100 % - -  12/13/18 1134 - - - - - - 5\' 4"  (1.626 m) 71.9 kg   Results for orders placed or performed during the hospital encounter of 12/13/18 (from the past 24 hour(s))  Urinalysis, Routine w reflex microscopic     Status: Abnormal   Collection Time: 12/13/18 11:47 AM  Result Value Ref Range   Color, Urine YELLOW YELLOW   APPearance HAZY (A) CLEAR   Specific Gravity, Urine 1.023 1.005 - 1.030   pH 7.0 5.0 - 8.0   Glucose, UA NEGATIVE NEGATIVE mg/dL   Hgb urine dipstick NEGATIVE NEGATIVE   Bilirubin Urine NEGATIVE NEGATIVE   Ketones, ur NEGATIVE NEGATIVE mg/dL   Protein, ur NEGATIVE NEGATIVE mg/dL   Nitrite NEGATIVE NEGATIVE   Leukocytes,Ua NEGATIVE NEGATIVE  Rapid urine drug screen (hospital performed)     Status: Abnormal   Collection Time:  12/13/18 11:47 AM  Result Value Ref Range   Opiates NONE DETECTED NONE DETECTED   Cocaine NONE DETECTED NONE DETECTED   Benzodiazepines NONE DETECTED NONE DETECTED   Amphetamines NONE DETECTED NONE DETECTED   Tetrahydrocannabinol POSITIVE (A) NONE DETECTED   Barbiturates NONE DETECTED NONE DETECTED   Meds ordered this encounter  Medications  . promethazine (PHENERGAN) injection 25 mg  . lactated ringers infusion  . promethazine (PHENERGAN) 25 MG tablet    Sig: Take 1 tablet (25 mg total) by mouth every 6 (six) hours as needed for nausea or vomiting. For bedtime    Dispense:  30 tablet    Refill:  0  Order Specific Question:   Supervising Provider    Answer:   Sloan Leiter [id number]   Assessment and Plan  --28 y.o. G3P1011 at [redacted]w[redacted]d  --+ THC. Concern for Cannabis Hyperemesis --Given Rx Phenergan for nausea not relieved by Compazine and Zofran --Focus on refining food selection --Discharge home in stable condition, tolerating PO at time of discharge, denies back or abdominal pain  Darlina Rumpf, CNM 12/13/2018, 2:10 PM

## 2018-12-13 NOTE — Discharge Instructions (Signed)
NAUSEA AND VOMITING IN PREGNANCY What are the causes? The cause of this condition is not known. It may be related to changes in chemicals (hormones) in the body during pregnancy, such as the high level of pregnancy hormone (human chorionic gonadotropin) or the increase in the female sex hormone (estrogen). What are the signs or symptoms? Symptoms of this condition include:  Nausea that does not go away.  Vomiting that does not allow you to keep any food down.  Weight loss.  Body fluid loss (dehydration).  Having no desire to eat, or not liking food that you have previously enjoyed. How is this diagnosed? This condition may be diagnosed based on:  A physical exam.  Your medical history.  Your symptoms.  Blood tests.  Urine tests. How is this treated? This condition is managed by controlling symptoms. This may include:  Following an eating plan. This can help lessen nausea and vomiting.  Taking prescription medicines. An eating plan and medicines are often used together to help control symptoms. If medicines do not help relieve nausea and vomiting, you may need to receive fluids through an IV at the hospital. Follow these instructions at home: Eating and drinking   Avoid the following: ? Drinking fluids with meals. Try not to drink anything during the 30 minutes before and after your meals. ? Drinking more than 1 cup of fluid at a time. ? Eating foods that trigger your symptoms. These may include spicy foods, coffee, high-fat foods, very sweet foods, and acidic foods. ? Skipping meals. Nausea can be more intense on an empty stomach. If you cannot tolerate food, do not force it. Try sucking on ice chips or other frozen items and make up for missed calories later. ? Lying down within 2 hours after eating. ? Being exposed to environmental triggers. These may include food smells, smoky rooms, closed spaces, rooms with strong smells, warm or humid places, overly loud and noisy  rooms, and rooms with motion or flickering lights. Try eating meals in a well-ventilated area that is free of strong smells. ? Quick and sudden changes in your movement. ? Taking iron pills and multivitamins that contain iron. If you take prescription iron pills, do not stop taking them unless your health care provider approves. ? Preparing food. The smell of food can spoil your appetite or trigger nausea.  To help relieve your symptoms: ? Listen to your body. Everyone is different and has different preferences. Find what works best for you. ? Eat and drink slowly. ? Eat 5-6 small meals daily instead of 3 large meals. Eating small meals and snacks can help you avoid an empty stomach. ? In the morning, before getting out of bed, eat a couple of crackers to avoid moving around on an empty stomach. ? Try eating starchy foods as these are usually tolerated well. Examples include cereal, toast, bread, potatoes, pasta, rice, and pretzels. ? Include at least 1 serving of protein with your meals and snacks. Protein options include lean meats, poultry, seafood, beans, nuts, nut butters, eggs, cheese, and yogurt. ? Try eating a protein-rich snack before bed. Examples of a protein-rick snack include cheese and crackers or a peanut butter sandwich made with 1 slice of whole-wheat bread and 1 tsp (5 g) of peanut butter. ? Eat or suck on things that have ginger in them. It may help relieve nausea. Add  tsp ground ginger to hot tea or choose ginger tea. ? Brush your teeth or use a mouth rinse after  meals. ? Talk with your health care provider about starting a supplement of vitamin B6. General instructions  Take over-the-counter and prescription medicines only as told by your health care provider.  Follow instructions from your health care provider about eating or drinking restrictions.  Continue to take your prenatal vitamins as told by your health care provider. If you are having trouble taking your  prenatal vitamins, talk with your health care provider about different options.  Keep all follow-up and pre-birth (prenatal) visits as told by your health care provider. This is important. Contact a health care provider if:  You have pain in your abdomen.  You have a severe headache.  You have vision problems.  You are losing weight.  You feel weak or dizzy. Get help right away if:  You cannot drink fluids without vomiting.  You vomit blood.  You have constant nausea and vomiting.  You are very weak.  You faint.  You have a fever and your symptoms suddenly get worse. Summary  Making some changes to your eating habits may help relieve nausea and vomiting.  This condition may be managed with medicine.  If medicines do not help relieve nausea and vomiting, you may need to receive fluids through an IV at the hospital. This information is not intended to replace advice given to you by your health care provider. Make sure you discuss any questions you have with your health care provider. Document Released: 03/04/2005 Document Revised: 03/24/2017 Document Reviewed: 11/01/2015 Elsevier Patient Education  2020 [employer].

## 2018-12-13 NOTE — MAU Note (Signed)
Holly Thornton is a 28 y.o. at [redacted]w[redacted]d here in MAU reporting: states she has been seen before for n/v and has been doing what has been recommended but states she is still projectile vomiting 4-5 times per day. States since yesterday she has not been able to keep anything down. Is now having epigastric pain and lower back pain. States the pain is what is concerning to her and last night was one of the worst night has had vomiting wise.  Onset of complaint: ongoing  Pain score: epigastric pain 7/10, back pain 7/10  Vitals:   12/13/18 1139  BP: 125/89  Pulse: 89  Resp: 16  Temp: 98.3 F (36.8 C)  SpO2: 100%      Lab orders placed from triage: UA

## 2018-12-17 ENCOUNTER — Inpatient Hospital Stay (HOSPITAL_COMMUNITY)
Admission: AD | Admit: 2018-12-17 | Discharge: 2018-12-20 | DRG: 833 | Disposition: A | Discharge status: Home / Self Care | Payer: Managed Care, Other (non HMO) | Attending: Obstetrics & Gynecology | Admitting: Obstetrics & Gynecology

## 2018-12-17 ENCOUNTER — Encounter (HOSPITAL_COMMUNITY): Payer: Self-pay | Admitting: *Deleted

## 2018-12-17 ENCOUNTER — Other Ambulatory Visit: Payer: Self-pay

## 2018-12-17 DIAGNOSIS — O210 Mild hyperemesis gravidarum: Secondary | ICD-10-CM | POA: Diagnosis present

## 2018-12-17 DIAGNOSIS — O211 Hyperemesis gravidarum with metabolic disturbance: Secondary | ICD-10-CM | POA: Diagnosis present

## 2018-12-17 DIAGNOSIS — Z20828 Contact with and (suspected) exposure to other viral communicable diseases: Secondary | ICD-10-CM | POA: Diagnosis present

## 2018-12-17 DIAGNOSIS — Z3A09 9 weeks gestation of pregnancy: Secondary | ICD-10-CM

## 2018-12-17 DIAGNOSIS — E876 Hypokalemia: Secondary | ICD-10-CM | POA: Diagnosis not present

## 2018-12-17 LAB — CBC
HCT: 37.3 % (ref 36.0–46.0)
Hemoglobin: 12.5 g/dL (ref 12.0–15.0)
MCH: 27.5 pg (ref 26.0–34.0)
MCHC: 33.5 g/dL (ref 30.0–36.0)
MCV: 82 fL (ref 80.0–100.0)
Platelets: 297 10*3/uL (ref 150–400)
RBC: 4.55 MIL/uL (ref 3.87–5.11)
RDW: 12 % (ref 11.5–15.5)
WBC: 9.9 10*3/uL (ref 4.0–10.5)
nRBC: 0 % (ref 0.0–0.2)

## 2018-12-17 LAB — URINALYSIS, ROUTINE W REFLEX MICROSCOPIC
Glucose, UA: NEGATIVE mg/dL
Hgb urine dipstick: NEGATIVE
Ketones, ur: 80 mg/dL — AB
Nitrite: NEGATIVE
Protein, ur: 30 mg/dL — AB
Specific Gravity, Urine: 1.032 — ABNORMAL HIGH (ref 1.005–1.030)
pH: 5 (ref 5.0–8.0)

## 2018-12-17 LAB — COMPREHENSIVE METABOLIC PANEL
ALT: 76 U/L — ABNORMAL HIGH (ref 0–44)
AST: 44 U/L — ABNORMAL HIGH (ref 15–41)
Albumin: 3.8 g/dL (ref 3.5–5.0)
Alkaline Phosphatase: 33 U/L — ABNORMAL LOW (ref 38–126)
Anion gap: 14 (ref 5–15)
BUN: 9 mg/dL (ref 6–20)
CO2: 21 mmol/L — ABNORMAL LOW (ref 22–32)
Calcium: 9.2 mg/dL (ref 8.9–10.3)
Chloride: 99 mmol/L (ref 98–111)
Creatinine, Ser: 0.75 mg/dL (ref 0.44–1.00)
GFR calc Af Amer: >60 mL/min (ref >60)
GFR calc non Af Amer: >60 mL/min (ref >60)
Glucose, Bld: 92 mg/dL (ref 70–99)
Potassium: 3.2 mmol/L — ABNORMAL LOW (ref 3.5–5.1)
Sodium: 134 mmol/L — ABNORMAL LOW (ref 135–145)
Total Bilirubin: 0.9 mg/dL (ref 0.3–1.2)
Total Protein: 7 g/dL (ref 6.5–8.1)

## 2018-12-17 LAB — SARS CORONAVIRUS 2 BY RT PCR (HOSPITAL ORDER, PERFORMED IN ~~LOC~~ HOSPITAL LAB): SARS Coronavirus 2: NEGATIVE

## 2018-12-17 MED ORDER — FAMOTIDINE IN NACL 20-0.9 MG/50ML-% IV SOLN
20.0000 mg | Freq: Two times a day (BID) | INTRAVENOUS | Status: DC
  Administered 2018-12-17 – 2018-12-19 (×5): 20 mg via INTRAVENOUS
  Filled 2018-12-17 (×5): qty 50

## 2018-12-17 MED ORDER — LACTATED RINGERS IV BOLUS
1000.0000 mL | Freq: Once | INTRAVENOUS | Status: AC
  Administered 2018-12-17: 1000 mL via INTRAVENOUS

## 2018-12-17 MED ORDER — THIAMINE HCL 100 MG/ML IJ SOLN
INTRAVENOUS | Status: AC
  Administered 2018-12-17 – 2018-12-19 (×3): via INTRAVENOUS
  Filled 2018-12-17 (×3): qty 1000

## 2018-12-17 MED ORDER — METOCLOPRAMIDE HCL 5 MG/ML IJ SOLN
10.0000 mg | Freq: Four times a day (QID) | INTRAMUSCULAR | Status: DC
  Administered 2018-12-17 – 2018-12-19 (×8): 10 mg via INTRAVENOUS
  Filled 2018-12-17 (×8): qty 2

## 2018-12-17 MED ORDER — THIAMINE HCL 100 MG/ML IJ SOLN: INTRAVENOUS | Status: DC

## 2018-12-17 MED ORDER — SODIUM CHLORIDE 0.9 % IV SOLN
8.0000 mg | Freq: Three times a day (TID) | INTRAVENOUS | Status: DC
  Administered 2018-12-17: 8 mg via INTRAVENOUS
  Filled 2018-12-17 (×6): qty 4

## 2018-12-17 MED ORDER — PROMETHAZINE HCL 25 MG PO TABS
12.5000 mg | ORAL_TABLET | ORAL | Status: DC
  Administered 2018-12-17 – 2018-12-20 (×19): 25 mg via ORAL
  Filled 2018-12-17 (×19): qty 1

## 2018-12-17 MED ORDER — HYDROXYZINE HCL 50 MG PO TABS
50.0000 mg | ORAL_TABLET | Freq: Four times a day (QID) | ORAL | Status: DC | PRN
  Administered 2018-12-19: 50 mg via ORAL
  Filled 2018-12-17 (×2): qty 1

## 2018-12-17 MED ORDER — HYDROXYZINE HCL 50 MG/ML IM SOLN
50.0000 mg | Freq: Four times a day (QID) | INTRAMUSCULAR | Status: DC | PRN
  Filled 2018-12-17: qty 1

## 2018-12-17 MED ORDER — THIAMINE HCL 100 MG/ML IJ SOLN
Freq: Once | INTRAVENOUS | Status: DC
  Filled 2018-12-17 (×2): qty 1000

## 2018-12-17 MED ORDER — ONDANSETRON 4 MG PO TBDP
4.0000 mg | ORAL_TABLET | Freq: Three times a day (TID) | ORAL | Status: DC
  Administered 2018-12-17 – 2018-12-20 (×8): 4 mg via ORAL
  Filled 2018-12-17: qty 1
  Filled 2018-12-17: qty 2
  Filled 2018-12-17 (×6): qty 1

## 2018-12-17 MED ORDER — KCL IN DEXTROSE-NACL 20-5-0.45 MEQ/L-%-% IV SOLN
INTRAVENOUS | Status: DC
  Administered 2018-12-18 – 2018-12-20 (×5): via INTRAVENOUS
  Filled 2018-12-17 (×5): qty 1000

## 2018-12-17 MED ORDER — ONDANSETRON HCL 4 MG/2ML IJ SOLN
4.0000 mg | Freq: Three times a day (TID) | INTRAMUSCULAR | Status: DC
  Administered 2018-12-20: 4 mg via INTRAVENOUS
  Filled 2018-12-17: qty 2

## 2018-12-17 MED ORDER — POTASSIUM CHLORIDE 2 MEQ/ML IV SOLN
INTRAVENOUS | Status: DC
  Filled 2018-12-17: qty 1000

## 2018-12-17 NOTE — H&P (Addendum)
Holly Thornton is a 28 y.o. female G3P1011 at 34 W EGA by LMP of 10/15/2018, confirmed by 1st trimester [place], [facility] 07/22/2019, presenting for nausea and vomiting in pregnancy unresponsive to oral medication taken at home (compazine and zofran).  She has had a 10 lbs weight loss over 3 weeks.  A recent urine toxicology from 12/13/2018 showed positive THC in urine though patient denies any substance abuse.  She has a history of colitis though she states her colitis is well controlled at this time.   OB History    Gravida  3   Para  1   Term  1   Preterm      AB  1   Living  1     SAB      TAB      Ectopic  1   Multiple  1   Live Births  1          Past Medical History:  Diagnosis Date  . ADHD (attention deficit hyperactivity disorder)   . Asthma    child  . Colitis    at age 60  . Ectopic pregnancy   . Frequent UTI   . Herpes genitalis in women    Past Surgical History:  Procedure Laterality Date  . NO PAST SURGERIES     Family History: family history includes Anemia in her mother; Depression in her mother; Diabetes in her maternal grandmother; Fibroids in her mother. Social History:  reports that she has never smoked. She has never used smokeless tobacco. She reports previous alcohol use of about 1.0 standard drinks of alcohol per week. She reports that she does not use drugs.     Maternal Diabetes: No Genetic Screening: Not done Maternal Ultrasounds/Referrals: Normal Fetal Ultrasounds or other Referrals:  None Maternal Substance Abuse:  Yes:  Type: Marijuana Significant Maternal Medications:  As above Significant Maternal Lab Results:  Pending Other Comments:  None  ROS History Constitutional: Denies fevers/chills Cardiovascular: Denies chest pain or palpitations Pulmonary: Denies coughing or wheezing Gastrointestinal: Denies diarrhea.   With nausea and vomiting.    Genitourinary: Denies pelvic pain, unusual vaginal bleeding, unusual vaginal discharge,  dysuria, urgency or frequency.  Musculoskeletal: Denies muscle or joint aches and pain.  Neurology: Denies abnormal sensations such as tingling or numbness.   Blood pressure 115/71, pulse 86, temperature 98.6 F (37 C), temperature source Oral, resp. rate 16, height 5\' 4"  (1.626 m), weight 67.6 kg, last menstrual period 10/15/2018, SpO2 100 %, unknown if currently breastfeeding. Exam Physical Exam  Vitals reviewed. Blood pressure 115/71, pulse 86, temperature 98.6 F (37 C), temperature source Oral, resp. rate 16, height 5\' 4"  (1.626 m), weight 67.6 kg, last menstrual period 10/15/2018, SpO2 100 %, unknown if currently breastfeeding. Constitutional: She is oriented to person, place, and time. She appears well-developed and well-nourished.  HENT:  Head: Normocephalic and atraumatic.  Eyes: Conjunctivae and EOM are normal.  Neck: Normal range of motion. Neck supple.  Cardiovascular: Normal rate, regular rhythm, normal heart sounds and intact distal pulses.  Respiratory: Effort normal and breath sounds normal.  GI: Soft. She exhibits no mass. There is no tenderness.  Genitourinary: Vagina normal and uterus normal.  Musculoskeletal: Normal range of motion.  Neurological: She is alert and oriented to person, place, and time.  Skin: Skin is warm and dry.  Psychiatric: She has a normal mood and affect. Judgment normal.  Prenatal labs: ABO, Rh: --/--/O POS, O POS (05/14 0051) Antibody: NEG (05/14 0051)  Rubella:  Immune RPR:   None HBsAg:  None HIV: Non Reactive (05/08 1558)  GBS:   None  Abdominal Ultrasound: 12/17/2018: Viable single IUP at [redacted] weeks EGA, FHB of 185.    Assessment/Plan: 28 y/o G3P1011 with hyperemesis gravidarum, hx of colitis and marijuana substance abuse, - Admit to specialty care -NPO with ice chips -IV hydration -Antiemetics start.  Discussed risks, benefits and alternatives of different medication options including a steroid taper including but not limited to  risks of fetal oral clefts especially if used before [redacted] weeks gestation.  Patient desires start of reglan. Phenergan, zofran, hydroxyzine and pepcid. If her symptoms do not improve she will want to try a steroid taper.    Alinda Dooms, MD.  12/17/2018, 2:02 PM

## 2018-12-18 LAB — COMPREHENSIVE METABOLIC PANEL
ALT: 66 U/L — ABNORMAL HIGH (ref 0–44)
AST: 32 U/L (ref 15–41)
Albumin: 3.3 g/dL — ABNORMAL LOW (ref 3.5–5.0)
Alkaline Phosphatase: 31 U/L — ABNORMAL LOW (ref 38–126)
Anion gap: 10 (ref 5–15)
BUN: 5 mg/dL — ABNORMAL LOW (ref 6–20)
CO2: 22 mmol/L (ref 22–32)
Calcium: 9 mg/dL (ref 8.9–10.3)
Chloride: 101 mmol/L (ref 98–111)
Creatinine, Ser: 0.67 mg/dL (ref 0.44–1.00)
GFR calc Af Amer: >60 mL/min (ref >60)
GFR calc non Af Amer: >60 mL/min (ref >60)
Glucose, Bld: 104 mg/dL — ABNORMAL HIGH (ref 70–99)
Potassium: 3.2 mmol/L — ABNORMAL LOW (ref 3.5–5.1)
Sodium: 133 mmol/L — ABNORMAL LOW (ref 135–145)
Total Bilirubin: 0.9 mg/dL (ref 0.3–1.2)
Total Protein: 6.2 g/dL — ABNORMAL LOW (ref 6.5–8.1)

## 2018-12-18 MED ORDER — POTASSIUM CHLORIDE CRYS ER 20 MEQ PO TBCR
20.0000 meq | EXTENDED_RELEASE_TABLET | Freq: Once | ORAL | Status: AC
  Administered 2018-12-18: 20 meq via ORAL
  Filled 2018-12-18: qty 1

## 2018-12-18 MED ORDER — SODIUM CHLORIDE 0.9 % IV SOLN
INTRAVENOUS | Status: DC | PRN
  Administered 2018-12-18: 17:00:00 250 mL via INTRAVENOUS

## 2018-12-18 MED ORDER — POTASSIUM CHLORIDE 10 MEQ/100ML IV SOLN
10.0000 meq | INTRAVENOUS | Status: DC
  Administered 2018-12-18: 10 meq via INTRAVENOUS
  Filled 2018-12-18 (×2): qty 100

## 2018-12-18 NOTE — Progress Notes (Signed)
Patient stating that she does not want to take the next dose of potassium that it burns too bad. Pt offering to take po dose.

## 2018-12-18 NOTE — Progress Notes (Signed)
RN called , pt unable to tolerate IV potassium. Pt asymptomatic, requested PO potassium. Ordered.  BP 106/66 (BP Location: Right Arm)   Pulse 89   Temp 98.2 F (36.8 C) (Oral)   Resp 16   Ht 5\' 4"  (1.626 m)   Wt 69.7 kg   LMP 10/15/2018   SpO2 100%   BMI 26.39 kg/m  Potassium was and received 81meq IV, but second dose was hurting to much. Pt tolerating clear liquids. Emesis today x1.

## 2018-12-18 NOTE — Progress Notes (Signed)
Per order attempted to doppler FHR and was unsuccessful. Pt educated on early gestation and that it may be difficult to doppler in early weeks of pregnancy. Pt states she "had an U/S today and heard the baby's heart rate."

## 2018-12-18 NOTE — Progress Notes (Signed)
Skyllar Tortorice, April 03, 1990 MRN [id number]  Subjective: Patient reports feeling better.  She had one vomiting episode earlier in the day and nothing more.  She has tolerated clears diet so far, jello and juice.   Objective: I have reviewed patient's vital signs and medications. Blood pressure 109/61, pulse 85, temperature 98.8 F (37.1 C), temperature source Oral, resp. rate 16, height 5\' 4"  (1.626 m), weight 69.7 kg, last menstrual period 10/15/2018, SpO2 99 %, unknown if currently breastfeeding. General: alert, cooperative and no distress GI: soft, non-tender; bowel sounds normal; no masses,  no organomegaly Extremities: extremities normal, atraumatic, no cyanosis or edema Vaginal Bleeding: none  CMP     Component Value Date/Time   NA 133 (L) 12/18/2018 0601   K 3.2 (L) 12/18/2018 0601   CL 101 12/18/2018 0601   CO2 22 12/18/2018 0601   GLUCOSE 104 (H) 12/18/2018 0601   BUN 5 (L) 12/18/2018 0601   CREATININE 0.67 12/18/2018 0601   CREATININE 0.98 10/15/2011 1000   CALCIUM 9.0 12/18/2018 0601   PROT 6.2 (L) 12/18/2018 0601   ALBUMIN 3.3 (L) 12/18/2018 0601   AST 32 12/18/2018 0601   ALT 66 (H) 12/18/2018 0601   ALKPHOS 31 (L) 12/18/2018 0601   BILITOT 0.9 12/18/2018 0601   GFRNONAA >60 12/18/2018 0601   GFRNONAA 83 10/15/2011 1000   GFRAA >60 12/18/2018 0601   GFRAA >89 10/15/2011 1000    Assessment/Plan: 28 y/o P1 at 9 weeks 1 day EGA with hyperemesis gravidarum, on antiemetics and improving  -Plan to advance diet as tolerated, once tolerating a regular diet then will switch to oral medication.  - K repletion.   LOS: 1 day  Alinda Dooms, MD.  12/18/2018, 2:53 PM

## 2018-12-19 LAB — POTASSIUM: Potassium: 3.9 mmol/L (ref 3.5–5.1)

## 2018-12-19 MED ORDER — FAMOTIDINE 20 MG PO TABS
20.0000 mg | ORAL_TABLET | Freq: Two times a day (BID) | ORAL | Status: DC
  Administered 2018-12-19: 21:00:00 20 mg via ORAL
  Filled 2018-12-19: qty 1

## 2018-12-19 MED ORDER — DOCUSATE SODIUM 100 MG PO CAPS
100.0000 mg | ORAL_CAPSULE | Freq: Every day | ORAL | Status: DC
  Administered 2018-12-19: 100 mg via ORAL
  Filled 2018-12-19: qty 1

## 2018-12-19 MED ORDER — DOXYLAMINE SUCCINATE (SLEEP) 25 MG PO TABS: 25.0000 mg | ORAL_TABLET | Freq: Three times a day (TID) | ORAL | Status: DC | PRN

## 2018-12-19 MED ORDER — VITAMIN B-6 25 MG PO TABS: 25.0000 mg | ORAL_TABLET | Freq: Three times a day (TID) | ORAL | Status: DC | PRN

## 2018-12-19 MED ORDER — ACETAMINOPHEN 325 MG PO TABS
650.0000 mg | ORAL_TABLET | Freq: Four times a day (QID) | ORAL | Status: DC | PRN
  Administered 2018-12-19 – 2018-12-20 (×2): 650 mg via ORAL
  Filled 2018-12-19 (×2): qty 2

## 2018-12-19 MED ORDER — DOCUSATE SODIUM 50 MG/5ML PO LIQD
100.0000 mg | Freq: Every day | ORAL | Status: DC
  Filled 2018-12-19: qty 10

## 2018-12-19 MED ORDER — POTASSIUM CHLORIDE CRYS ER 20 MEQ PO TBCR
20.0000 meq | EXTENDED_RELEASE_TABLET | Freq: Once | ORAL | Status: AC
  Administered 2018-12-19: 20 meq via ORAL
  Filled 2018-12-19: qty 1

## 2018-12-19 MED ORDER — DOXYLAMINE SUCCINATE (SLEEP) 25 MG PO TABS
25.0000 mg | ORAL_TABLET | Freq: Every day | ORAL | Status: DC
  Administered 2018-12-19: 25 mg via ORAL
  Filled 2018-12-19: qty 1

## 2018-12-19 MED ORDER — VITAMIN B-6 25 MG PO TABS
25.0000 mg | ORAL_TABLET | Freq: Every day | ORAL | Status: DC
  Administered 2018-12-19: 25 mg via ORAL
  Filled 2018-12-19: qty 1

## 2018-12-19 MED ORDER — METOCLOPRAMIDE HCL 10 MG PO TABS: 10.0000 mg | ORAL_TABLET | Freq: Three times a day (TID) | ORAL | Status: DC | PRN

## 2018-12-19 MED ORDER — METOCLOPRAMIDE HCL 5 MG/ML IJ SOLN
10.0000 mg | Freq: Four times a day (QID) | INTRAMUSCULAR | Status: DC
  Administered 2018-12-19 – 2018-12-20 (×3): 10 mg via INTRAVENOUS
  Filled 2018-12-19 (×3): qty 2

## 2018-12-19 MED ORDER — FAMOTIDINE IN NACL 20-0.9 MG/50ML-% IV SOLN
20.0000 mg | Freq: Two times a day (BID) | INTRAVENOUS | Status: DC
  Administered 2018-12-20: 20 mg via INTRAVENOUS
  Filled 2018-12-19: qty 50

## 2018-12-19 NOTE — Progress Notes (Signed)
Pt reports she is feeling better than on admission.  She reports that she tolerated clears well.  Last night when she swallowed the Phenergan tablet she had an "upset stomach" but states it was not nausea.  She gestured to her sternum so likely reflux.  Pt plans to try regular food today.    Pt has a heating pad on her abdomen that she brought from home b/c she was cold.  Temp:  [97.8 F (36.6 C)-98.8 F (37.1 C)] 98.1 F (36.7 C) (10/03 0552) Pulse Rate:  [75-89] 75 (10/03 0554) Resp:  [16-18] 17 (10/03 0552) BP: (84-109)/(51-66) 99/64 (10/03 0554) SpO2:  [99 %-100 %] 100 % (10/03 0552) Weight:  [68.4 kg] 68.4 kg (10/03 0552)   Gen:  NAD, Alert Abd:  Soft, nontender.  Heating pad on abdomen. Ext:  No edema or calf tenderness.  No TED hose.  A/P   IUP @ 9 2/7 weeks Hyperemesis  Currently on Zofran, Phenergan, Reglan, Compazine and Pepcid.  Finishing a banana bag this morning.  Advance to regular diet.  Encouraged bland foods.  Transition meds to po. Hypokalemia, mild  Unchanged from yesterday.  Pt could not tolerate K runs.  Start D5LR with 20 meq KCL as previously ordered.  Kdur Po. DVT prophylaxis  Place TED hose.  Anticipate discharge this evening or in the morning if tolerating po. D/w Noralyn Pick, CNM.

## 2018-12-19 NOTE — Progress Notes (Signed)
S: RN called x 2 this afternoon about pt eating a sandwich around lunch time and was holding it down , but about several hours ago pt stated to vomit again. Pyt has vomited x5 times since and is having abdominal cramping. Pt was switch to all PO nausea meds and was being prepped for discharge to home, but now pt unable to tolerate foods again. Pt under the impression it was an old sandwich and the nausea with vomitting in a direct correlation. Pt endorses her stomach hurt a little bit after the sandwhich earlier but had a BM and her stomach felt better.    O: BP 117/77 (BP Location: Left Arm)   Pulse 76   Temp 98.2 F (36.8 C) (Oral)   Resp 17   Ht 5\' 4"  (1.626 m)   Wt 68.4 kg   LMP 10/15/2018   SpO2 98%   BMI 25.88 kg/m   A/P: Rewrote for IV nausea meds, clear liquids diet, kpad for abdominal discomfort, gave stool softener for abdominal cramaps. Pt request tylenol for pain, was instructed tylenol was not going to help her and pt wanted it anyway's. Will reassess and monitor in the morning. Will updated Dr Simona Huh in the morning. Pt in no acute distress. Encouraged ambulation, fluids with clear liquids.

## 2018-12-20 DIAGNOSIS — E876 Hypokalemia: Secondary | ICD-10-CM | POA: Diagnosis not present

## 2018-12-20 MED ORDER — ONDANSETRON HCL 4 MG PO TABS: 4.0000 mg | ORAL_TABLET | Freq: Every day | ORAL | 1 refills | Status: DC | PRN

## 2018-12-20 MED ORDER — DOXYLAMINE SUCCINATE (SLEEP) 25 MG PO TABS: 25.0000 mg | ORAL_TABLET | Freq: Every day | ORAL | 0 refills | Status: DC

## 2018-12-20 MED ORDER — FAMOTIDINE 20 MG PO TABS: 20.0000 mg | ORAL_TABLET | Freq: Two times a day (BID) | ORAL | 1 refills | Status: DC

## 2018-12-20 MED ORDER — FAMOTIDINE 20 MG PO TABS: 20.0000 mg | ORAL_TABLET | Freq: Two times a day (BID) | ORAL | Status: DC

## 2018-12-20 MED ORDER — METOCLOPRAMIDE HCL 10 MG PO TABS: 10.0000 mg | ORAL_TABLET | Freq: Four times a day (QID) | ORAL | Status: DC | PRN

## 2018-12-20 MED ORDER — PYRIDOXINE HCL 25 MG PO TABS: 25.0000 mg | ORAL_TABLET | Freq: Every day | ORAL | 1 refills | Status: DC

## 2018-12-20 MED ORDER — METOCLOPRAMIDE HCL 10 MG PO TABS: 10.0000 mg | ORAL_TABLET | Freq: Four times a day (QID) | ORAL | 1 refills | Status: DC | PRN

## 2018-12-20 NOTE — Progress Notes (Signed)
Discharge instructions and prescriptions given to pt. Discussed signs and symptoms to report to the MD, how to manage hyperemesis, upcoming appointments, and meds. Pt verbalizes understanding and has no questions or concerns at this time. Pt discharged in stable condition.

## 2018-12-20 NOTE — Progress Notes (Signed)
Reviewed CNM note.  Agree.  Pt resting quietly.  Denies cramping or bleeding.  BP 114/78 (BP Location: Right Arm)   Pulse 88   Temp 98.1 F (36.7 C) (Oral)   Resp 16   Ht 5\' 4"  (1.626 m)   Wt 68.5 kg   LMP 10/15/2018   SpO2 100%   BMI 25.92 kg/m    Gen:  NAD Abd; soft, nontender Ext:  No calf tenderness or edema.  No TED hose  Continue current management per CNM note. Discussed benefits of Diclegis outpatient. RN placing TED hose now.

## 2018-12-20 NOTE — Discharge Summary (Signed)
Antepartum OB Discharge Summary     Patient Name: Holly Thornton DOB: 1990/04/02 MRN: [id number]  Date of admission: 12/17/2018  Date of discharge: 12/20/2018 Type of Admission: Hyperemesis gravidarum @ [redacted] weeks gestational.   Admitting diagnosis: Hyperemesis Intrauterine pregnancy: [redacted]w[redacted]d     Secondary diagnosis:  Principal Problem:   Hyperemesis gravidarum Active Problems:   Hypokalemia           History of Present Illness: Holly Thornton is a 28 y.o. female, G3P1011, who presents at [redacted]w[redacted]d weeks gestation. The patient has been followed at  [facility]  Her pregnancy has been complicated by:  Patient Active Problem List   Diagnosis Date Noted  . Hypokalemia 12/20/2018  . Hyperemesis gravidarum 12/17/2018    Class: Diagnosis of  . Mild tetrahydrocannabinol (THC) abuse 12/13/2018  . Abdominal pain affecting pregnancy 07/24/2018  . History of colitis 02/10/2015  . History of frequent urinary tract infections 02/10/2015  . Allergy to drug - Amoxicillin - anaphylaxis and swelling 02/10/2015  . 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 ADHD 03/01/2009    Hospital course:   LOS# 3  Per admission HP on 12/17/2018: Holly Thornton is a 28 y.o. female G3P1011 at 67 W EGA by LMP of 10/15/2018, confirmed by 1st trimester [place], [facility] 07/22/2019, presenting for nausea and vomiting in pregnancy unresponsive to oral medication taken at home (compazine and zofran).  She has had a 10 lbs weight loss over 3 weeks.  A recent urine toxicology from 12/13/2018 showed positive THC in urine though patient denies any substance abuse.  She has a history of colitis though she states her colitis is well controlled at this time.  On 10/2 hypokalemia was noted at 3.2 and replenished with IV potassium and PO, pt asymptomatic. Pt was treated for three days with IV antiemetics: zofran,  phenergan, reglan, Unisom, B6, and Pepcid.   On 10/3: pt advanced diet to liquids then to regular diet, tolerated well, was switch the PO meds and still tolerated, but  the later that night had a relapse with x5 emesis bouts, pt then placed back on IV meds and clear liquid.  On 10/4, today, pt vomited once, but tolerated bland diet, and changed to po meds and tolerated well. Pt still c/o stomach aches and nausea but able to hold food down. Pt requested to be discharge and endorses feeling better. Pt discharge to home in stable condition and with meds listed below.      Physical exam  Vitals:   12/20/18 0339 12/20/18 0500 12/20/18 0801 12/20/18 1212  BP: 106/65  116/72 114/78  Pulse: 75  73 88  Resp: 18  18 16   Temp: 98.2 F (36.8 C)  98.3 F (36.8 C) 98.1 F (36.7 C)  TempSrc: Oral  Oral Oral  SpO2: 99%  100% 100%  Weight:  68.5 kg    Height:       General: alert, cooperative and no distress Uterine Fundus: unpalpitable Abdomen: Soft non-tender.   Perineum: intact, no vaginal bleeding or leakage.  DVT Evaluation: No evidence of DVT seen on physical exam. Negative Homan's sign. No cords or calf tenderness. No significant calf/ankle edema.  Labs: Lab Results  Component Value Date   WBC 9.9 12/17/2018   HGB 12.5 12/17/2018   HCT 37.3 12/17/2018   MCV 82.0 12/17/2018   PLT 297 12/17/2018   CMP Latest  Ref Rng & Units 12/19/2018  Glucose 70 - 99 mg/dL -  BUN 6 - 20 mg/dL -  Creatinine 0.44 - 1.00 mg/dL -  Sodium 135 - 145 mmol/L -  Potassium 3.5 - 5.1 mmol/L 3.9  Chloride 98 - 111 mmol/L -  CO2 22 - 32 mmol/L -  Calcium 8.9 - 10.3 mg/dL -  Total Protein 6.5 - 8.1 g/dL -  Total Bilirubin 0.3 - 1.2 mg/dL -  Alkaline Phos 38 - 126 U/L -  AST 15 - 41 U/L -  ALT 0 - 44 U/L -    Date of discharge: 12/20/2018 Discharge Diagnoses: Hyperemesis graviderum Discharge instruction: per After Visit Summary and "Baby and Me Booklet".  After visit meds:   Activity:            unrestricted Advance as tolerated. Pelvic rest for 6 weeks.  Diet:                routine and Bland, eat frquently, with protein every two hours.  Medications: PNV and antiemetic.  Condition:  Pt discharge to home in stable condition    Meds: Allergies as of 12/20/2018      Reactions   Amoxicillin Anaphylaxis, Swelling   Has patient had a PCN reaction causing immediate rash, facial/tongue/throat swelling, SOB or lightheadedness with hypotension: Yes Has patient had a PCN reaction causing severe rash involving mucus membranes or skin necrosis: No Has patient had a PCN reaction that required hospitalization No Has patient had a PCN reaction occurring within the last 10 years: No If all of the above answers are "NO", then may proceed with Cephalosporin use.   Peanut-containing Drug Products Anaphylaxis, Hives, Diarrhea, Rash, Other (See Comments)   Puffiness of the eyes       Medication List    STOP taking these medications   acetaminophen 500 MG tablet Commonly known as: TYLENOL   ondansetron 4 MG disintegrating tablet Commonly known as: Zofran ODT   prochlorperazine 10 MG tablet Commonly known as: COMPAZINE   promethazine 25 MG tablet Commonly known as: PHENERGAN     TAKE these medications   doxylamine (Sleep) 25 MG tablet Commonly known as: UNISOM Take 1 tablet (25 mg total) by mouth at bedtime.   famotidine 20 MG tablet Commonly known as: PEPCID Take 1 tablet (20 mg total) by mouth 2 (two) times daily.   metoCLOPramide 10 MG tablet Commonly known as: REGLAN Take 1 tablet (10 mg total) by mouth every 6 (six) hours as needed for nausea.   ondansetron 4 MG tablet Commonly known as: Zofran Take 1 tablet (4 mg total) by mouth daily as needed for nausea or vomiting.   pyridOXINE 25 MG tablet Commonly known as: VITAMIN B-6 Take 1 tablet (25 mg total) by mouth at bedtime.       Discharge Follow Up:  Follow-up [facility]. Schedule an appointment as soon as possible for a visit.   Specialty: Obstetrics and Gynecology Why: 1-2 weeks for f/u with out pt management of HG and weight check  Contact information: [place] [place] [phone] [place], NP-C, CNM 12/20/2018, [place] PM  [place], [place]

## 2018-12-20 NOTE — Progress Notes (Addendum)
LOS# 3  S: Pt reports she is feeling better than on admission. Had bout of relapses with hyperemesis last night. Had to switch back to IV meds, will reattempts advancing diet, today. Pt endorses she still wants to to go home this afternoon and has had 6 hours of no vomitting.  She reports that she tolerated clears well.  Pt plans to try regular food today.    Pt has a kpad order to help with upset stomach, pt denies ti is helping.   Temp:  [98.2 F (36.8 C)-98.3 F (36.8 C)] 98.3 F (36.8 C) (10/04 0801) Pulse Rate:  [73-85] 73 (10/04 0801) Resp:  [17-18] 18 (10/04 0801) BP: (106-119)/(65-77) 116/72 (10/04 0801) SpO2:  [98 %-100 %] 100 % (10/04 0801) Weight:  [68.5 kg] 68.5 kg (10/04 0500)   Gen:  NAD, Alert Abd:  Soft, nontender.  Heating pad on back. Ext:  No edema or calf tenderness.  No TED hose.  A/P   IUP @ 9 3/7 weeks Hyperemesis  Currently on Zofran, Phenergan, Reglan, Compazine and Pepcid.  Maintenance fluids .  Advance to regular diet.  Encouraged bland foods.SMall, frequent meals. Discussed with pot that we  are not going to be able to take away all the nausea, how hope it to have have frequent emesis spells.   Transition meds to po after breakfast, plan for discharge this afternoon.  Hypokalemia, mild  Continued D5LR with 20 meq KCL as previously ordered. Asymptomatic. Discussed food rich in  potasium.    DVT prophylaxis  Place TED hose.  Anticipate discharge this evening.  Plan to updated Dr Simona Huh today.   [address], [place], [place] 8:28 AM 12/20/18

## 2019-01-07 ENCOUNTER — Encounter (HOSPITAL_COMMUNITY): Payer: Self-pay | Admitting: *Deleted

## 2019-01-07 ENCOUNTER — Inpatient Hospital Stay (HOSPITAL_COMMUNITY)
Admission: AD | Admit: 2019-01-07 | Discharge: 2019-01-07 | Disposition: A | Discharge status: Home / Self Care | Payer: Managed Care, Other (non HMO) | Attending: Obstetrics and Gynecology | Admitting: Obstetrics and Gynecology

## 2019-01-07 ENCOUNTER — Other Ambulatory Visit: Payer: Self-pay

## 2019-01-07 DIAGNOSIS — O211 Hyperemesis gravidarum with metabolic disturbance: Secondary | ICD-10-CM | POA: Diagnosis not present

## 2019-01-07 DIAGNOSIS — O210 Mild hyperemesis gravidarum: Secondary | ICD-10-CM | POA: Diagnosis not present

## 2019-01-07 DIAGNOSIS — E860 Dehydration: Secondary | ICD-10-CM

## 2019-01-07 DIAGNOSIS — Z3A12 12 weeks gestation of pregnancy: Secondary | ICD-10-CM | POA: Diagnosis not present

## 2019-01-07 LAB — CBC
HCT: 36.2 % (ref 36.0–46.0)
Hemoglobin: 12.3 g/dL (ref 12.0–15.0)
MCH: 28 pg (ref 26.0–34.0)
MCHC: 34 g/dL (ref 30.0–36.0)
MCV: 82.5 fL (ref 80.0–100.0)
Platelets: 312 10*3/uL (ref 150–400)
RBC: 4.39 MIL/uL (ref 3.87–5.11)
RDW: 12.3 % (ref 11.5–15.5)
WBC: 8.4 10*3/uL (ref 4.0–10.5)
nRBC: 0 % (ref 0.0–0.2)

## 2019-01-07 LAB — URINALYSIS, ROUTINE W REFLEX MICROSCOPIC
Bilirubin Urine: NEGATIVE
Glucose, UA: NEGATIVE mg/dL
Hgb urine dipstick: NEGATIVE
Ketones, ur: 80 mg/dL — AB
Nitrite: NEGATIVE
Protein, ur: 30 mg/dL — AB
Specific Gravity, Urine: 1.023 (ref 1.005–1.030)
pH: 5 (ref 5.0–8.0)

## 2019-01-07 LAB — COMPREHENSIVE METABOLIC PANEL
ALT: 52 U/L — ABNORMAL HIGH (ref 0–44)
AST: 28 U/L (ref 15–41)
Albumin: 3.7 g/dL (ref 3.5–5.0)
Alkaline Phosphatase: 40 U/L (ref 38–126)
Anion gap: 10 (ref 5–15)
BUN: 5 mg/dL — ABNORMAL LOW (ref 6–20)
CO2: 19 mmol/L — ABNORMAL LOW (ref 22–32)
Calcium: 9.6 mg/dL (ref 8.9–10.3)
Chloride: 105 mmol/L (ref 98–111)
Creatinine, Ser: 0.63 mg/dL (ref 0.44–1.00)
GFR calc Af Amer: >60 mL/min (ref >60)
GFR calc non Af Amer: >60 mL/min (ref >60)
Glucose, Bld: 85 mg/dL (ref 70–99)
Potassium: 3.6 mmol/L (ref 3.5–5.1)
Sodium: 134 mmol/L — ABNORMAL LOW (ref 135–145)
Total Bilirubin: 0.7 mg/dL (ref 0.3–1.2)
Total Protein: 7.3 g/dL (ref 6.5–8.1)

## 2019-01-07 LAB — LIPASE, BLOOD: Lipase: 27 U/L (ref 11–51)

## 2019-01-07 MED ORDER — SCOPOLAMINE 1 MG/3DAYS TD PT72: 1.0000 | MEDICATED_PATCH | TRANSDERMAL | 1 refills | Status: DC

## 2019-01-07 MED ORDER — PROCHLORPERAZINE EDISYLATE 10 MG/2ML IJ SOLN
10.0000 mg | Freq: Four times a day (QID) | INTRAMUSCULAR | Status: DC | PRN
  Administered 2019-01-07: 10 mg via INTRAVENOUS
  Filled 2019-01-07 (×2): qty 2

## 2019-01-07 MED ORDER — SCOPOLAMINE 1 MG/3DAYS TD PT72
1.0000 | MEDICATED_PATCH | TRANSDERMAL | Status: DC
  Administered 2019-01-07: 18:00:00 1.5 mg via TRANSDERMAL
  Filled 2019-01-07: qty 1

## 2019-01-07 MED ORDER — B-6 50 MG PO TABS: 50.0000 mg | ORAL_TABLET | Freq: Three times a day (TID) | ORAL | 1 refills | Status: DC

## 2019-01-07 MED ORDER — PROCHLORPERAZINE MALEATE 10 MG PO TABS: 10.0000 mg | ORAL_TABLET | Freq: Four times a day (QID) | ORAL | 1 refills | Status: DC | PRN

## 2019-01-07 MED ORDER — LACTATED RINGERS IV BOLUS
1000.0000 mL | Freq: Once | INTRAVENOUS | Status: AC
  Administered 2019-01-07: 18:00:00 1000 mL via INTRAVENOUS

## 2019-01-07 NOTE — MAU Note (Signed)
Patient present to MAU with worsening nausea and vomiting over past week.  Pt has hx of GG with a hospital admission 3 weeks ago.  Pt currently on Reglan, Pepcid, and Phenergan.  Also c/o mid abdominal pain after eating and frequent headaches.

## 2019-01-07 NOTE — MAU Provider Note (Signed)
History     CSN: [id number]  Arrival date and time: 01/07/19 1559   First Provider Initiated Contact with Patient 01/07/19 1756      Chief Complaint  Patient presents with  . Nausea   28 y.o. G3P1011 @12 .0 wks with hx of HEG presenting with worsening sx. Reports she was doing well on Reglan, Phenergan, Pepcid, B-6, and Unisom until 2 days ago. She can tolerate very little food or fluids. Reports 3 lb weight loss since she was admitted 2 weeks ago and near 20 lb weight loss total. Also reports 4 episodes of diarrhea, 2 yesterday, and 2 today. Before this she experienced constipation. Endorses upper abdominal pain after she eats. Denies VB.  OB History    Gravida  3   Para  1   Term  1   Preterm      AB  1   Living  1     SAB      TAB      Ectopic  1   Multiple  1   Live Births  1           Past Medical History:  Diagnosis Date  . ADHD (attention deficit hyperactivity disorder)   . Asthma    child  . Colitis    at age 20  . Ectopic pregnancy   . Frequent UTI   . Herpes genitalis in women     Past Surgical History:  Procedure Laterality Date  . NO PAST SURGERIES      Family History  Problem Relation Age of Onset  . Depression Mother   . Fibroids Mother   . Anemia Mother   . Diabetes Maternal Grandmother     Social History   Tobacco Use  . Smoking status: Never Smoker  . Smokeless tobacco: Never Used  Substance Use Topics  . Alcohol use: Not Currently    Alcohol/week: 1.0 standard drinks    Types: 1 Standard drinks or equivalent per week  . Drug use: No    Allergies:  Allergies  Allergen Reactions  . Amoxicillin Anaphylaxis and Swelling    Has patient had a PCN reaction causing immediate rash, facial/tongue/throat swelling, SOB or lightheadedness with hypotension: Yes Has patient had a PCN reaction causing severe rash involving mucus membranes or skin necrosis: No Has patient had a PCN reaction that required hospitalization No Has  patient had a PCN reaction occurring within the last 10 years: No If all of the above answers are "NO", then may proceed with Cephalosporin use.   Marland Kitchen Peanut-Containing Drug Products Anaphylaxis, Hives, Diarrhea, Rash and Other (See Comments)    Puffiness of the eyes     Medications Prior to Admission  Medication Sig Dispense Refill Last Dose  . doxylamine, Sleep, (UNISOM) 25 MG tablet Take 1 tablet (25 mg total) by mouth at bedtime. 30 tablet 0   . famotidine (PEPCID) 20 MG tablet Take 1 tablet (20 mg total) by mouth 2 (two) times daily. 60 tablet 1   . metoCLOPramide (REGLAN) 10 MG tablet Take 1 tablet (10 mg total) by mouth every 6 (six) hours as needed for nausea. 30 tablet 1   . ondansetron (ZOFRAN) 4 MG tablet Take 1 tablet (4 mg total) by mouth daily as needed for nausea or vomiting. 30 tablet 1   . [DISCONTINUED] vitamin B-6 (VITAMIN B-6) 25 MG tablet Take 1 tablet (25 mg total) by mouth at bedtime. 30 tablet 1     Review of Systems  Constitutional: Negative for chills and fever.  Gastrointestinal: Positive for diarrhea, nausea and vomiting.  Genitourinary: Negative for vaginal bleeding.   Physical Exam   Blood pressure 116/61, pulse 77, temperature 97.8 F (36.6 C), resp. rate 16, weight 67 kg, last menstrual period 10/15/2018, unknown if currently breastfeeding.  Physical Exam  Nursing note and vitals reviewed. Constitutional: She is oriented to person, place, and time. She appears well-developed and well-nourished. No distress.  HENT:  Head: Normocephalic and atraumatic.  Neck: Normal range of motion.  Cardiovascular: Normal rate.  Respiratory: Effort normal. No respiratory distress.  GI: Soft. She exhibits no distension and no mass. There is abdominal tenderness in the epigastric area. There is no rebound and no guarding.  Musculoskeletal: Normal range of motion.  Neurological: She is alert and oriented to person, place, and time.  Skin: Skin is warm and dry.   Psychiatric: She has a normal mood and affect.  FHT 166  Results for orders placed or performed during the hospital encounter of 01/07/19 (from the past 24 hour(s))  CBC     Status: None   Collection Time: 01/07/19  6:29 PM  Result Value Ref Range   WBC 8.4 4.0 - 10.5 K/uL   RBC 4.39 3.87 - 5.11 MIL/uL   Hemoglobin 12.3 12.0 - 15.0 g/dL   HCT 36.2 36.0 - 46.0 %   MCV 82.5 80.0 - 100.0 fL   MCH 28.0 26.0 - 34.0 pg   MCHC 34.0 30.0 - 36.0 g/dL   RDW 12.3 11.5 - 15.5 %   Platelets 312 150 - 400 K/uL   nRBC 0.0 0.0 - 0.2 %  Comprehensive metabolic panel     Status: Abnormal   Collection Time: 01/07/19  6:29 PM  Result Value Ref Range   Sodium 134 (L) 135 - 145 mmol/L   Potassium 3.6 3.5 - 5.1 mmol/L   Chloride 105 98 - 111 mmol/L   CO2 19 (L) 22 - 32 mmol/L   Glucose, Bld 85 70 - 99 mg/dL   BUN 5 (L) 6 - 20 mg/dL   Creatinine, Ser 0.63 0.44 - 1.00 mg/dL   Calcium 9.6 8.9 - 10.3 mg/dL   Total Protein 7.3 6.5 - 8.1 g/dL   Albumin 3.7 3.5 - 5.0 g/dL   AST 28 15 - 41 U/L   ALT 52 (H) 0 - 44 U/L   Alkaline Phosphatase 40 38 - 126 U/L   Total Bilirubin 0.7 0.3 - 1.2 mg/dL   GFR calc non Af Amer >60 >60 mL/min   GFR calc Af Amer >60 >60 mL/min   Anion gap 10 5 - 15  Lipase, blood     Status: None   Collection Time: 01/07/19  6:29 PM  Result Value Ref Range   Lipase 27 11 - 51 U/L  Urinalysis, Routine w reflex microscopic     Status: Abnormal   Collection Time: 01/07/19  6:49 PM  Result Value Ref Range   Color, Urine AMBER (A) YELLOW   APPearance HAZY (A) CLEAR   Specific Gravity, Urine 1.023 1.005 - 1.030   pH 5.0 5.0 - 8.0   Glucose, UA NEGATIVE NEGATIVE mg/dL   Hgb urine dipstick NEGATIVE NEGATIVE   Bilirubin Urine NEGATIVE NEGATIVE   Ketones, ur 80 (A) NEGATIVE mg/dL   Protein, ur 30 (A) NEGATIVE mg/dL   Nitrite NEGATIVE NEGATIVE   Leukocytes,Ua LARGE (A) NEGATIVE   RBC / HPF 0-5 0 - 5 RBC/hpf   WBC, UA 11-20 0 -  5 WBC/hpf   Bacteria, UA RARE (A) NONE SEEN    Squamous Epithelial / LPF 0-5 0 - 5   Mucus PRESENT    MAU Course  Procedures Meds ordered this encounter  Medications  . lactated ringers bolus 1,000 mL  . scopolamine (TRANSDERM-SCOP) 1 MG/3DAYS 1.5 mg  . prochlorperazine (COMPAZINE) injection 10 mg  . pyridOXINE 50 MG TABS    Sig: Take 50 mg by mouth 3 (three) times daily.    Dispense:  90 tablet    Refill:  1    Order Specific Question:   Supervising Provider    Answer:   Verita Schneiders A [id number]  . scopolamine (TRANSDERM-SCOP) 1 MG/3DAYS    Sig: Place 1 patch (1.5 mg total) onto the skin every 3 (three) days.    Dispense:  10 patch    Refill:  1    Order Specific Question:   Supervising Provider    Answer:   Verita Schneiders A [[id number]]  . prochlorperazine (COMPAZINE) 10 MG tablet    Sig: Take 1 tablet (10 mg total) by mouth every 6 (six) hours as needed for nausea or vomiting.    Dispense:  30 tablet    Refill:  1    Order Specific Question:   Supervising Provider    Answer:   Verita Schneiders A [id number]   MDM Labs ordered and reviewed. No emesis. Pt feeling better. Po challenge>tolerate fluid and crackers. Will Rx home with Compazine, Scop patch, and B-6. If fails this regimen consider steroid tape. Stable for discharge.   Assessment and Plan   1. [redacted] weeks gestation of pregnancy   2. Hyperemesis gravidarum   3. Dehydration    Discharge home Follow up at [facility] as scheduled Rx Compazine Rx Scopolamine Rx B-6 50 mg TID  Allergies as of 01/07/2019      Reactions   Amoxicillin Anaphylaxis, Swelling   Has patient had a PCN reaction causing immediate rash, facial/tongue/throat swelling, SOB or lightheadedness with hypotension: Yes Has patient had a PCN reaction causing severe rash involving mucus membranes or skin necrosis: No Has patient had a PCN reaction that required hospitalization No Has patient had a PCN reaction occurring within the last 10 years: No If all of the above answers are "NO", then may proceed with  Cephalosporin use.   Peanut-containing Drug Products Anaphylaxis, Hives, Diarrhea, Rash, Other (See Comments)   Puffiness of the eyes       Medication List    STOP taking these medications   metoCLOPramide 10 MG tablet Commonly known as: REGLAN   ondansetron 4 MG tablet Commonly known as: Zofran     TAKE these medications   B-6 50 MG Tabs Take 50 mg by mouth 3 (three) times daily. What changed:   medication strength  how much to take  when to take this   doxylamine (Sleep) 25 MG tablet Commonly known as: UNISOM Take 1 tablet (25 mg total) by mouth at bedtime.   famotidine 20 MG tablet Commonly known as: PEPCID Take 1 tablet (20 mg total) by mouth 2 (two) times daily.   prochlorperazine 10 MG tablet Commonly known as: COMPAZINE Take 1 tablet (10 mg total) by mouth every 6 (six) hours as needed for nausea or vomiting.   scopolamine 1 MG/3DAYS Commonly known as: TRANSDERM-SCOP Place 1 patch (1.5 mg total) onto the skin every 3 (three) days. Start taking on: January 10, 2019      Julianne Handler, [place] 01/07/2019, 7:53 PM

## 2019-01-07 NOTE — Discharge Instructions (Signed)
Hyperemesis Gravidarum Hyperemesis gravidarum is a severe form of nausea and vomiting that happens during pregnancy. Hyperemesis is worse than morning sickness. It may cause you to have nausea or vomiting all day for many days. It may keep you from eating and drinking enough food and liquids, which can lead to dehydration, malnutrition, and weight loss. Hyperemesis usually occurs during the first half (the first 20 weeks) of pregnancy. It often goes away once a woman is in her second half of pregnancy. However, sometimes hyperemesis continues through an entire pregnancy. What are the causes? The cause of this condition is not known. It may be related to changes in chemicals (hormones) in the body during pregnancy, such as the high level of pregnancy hormone (human chorionic gonadotropin) or the increase in the female sex hormone (estrogen). What are the signs or symptoms? Symptoms of this condition include:  Nausea that does not go away.  Vomiting that does not allow you to keep any food down.  Weight loss.  Body fluid loss (dehydration).  Having no desire to eat, or not liking food that you have previously enjoyed. How is this diagnosed? This condition may be diagnosed based on:  A physical exam.  Your medical history.  Your symptoms.  Blood tests.  Urine tests. How is this treated? This condition is managed by controlling symptoms. This may include:  Following an eating plan. This can help lessen nausea and vomiting.  Taking prescription medicines. An eating plan and medicines are often used together to help control symptoms. If medicines do not help relieve nausea and vomiting, you may need to receive fluids through an IV at the hospital. Follow these instructions at home: Eating and drinking   Avoid the following: ? Drinking fluids with meals. Try not to drink anything during the 30 minutes before and after your meals. ? Drinking more than 1 cup of fluid at a  time. ? Eating foods that trigger your symptoms. These may include spicy foods, coffee, high-fat foods, very sweet foods, and acidic foods. ? Skipping meals. Nausea can be more intense on an empty stomach. If you cannot tolerate food, do not force it. Try sucking on ice chips or other frozen items and make up for missed calories later. ? Lying down within 2 hours after eating. ? Being exposed to environmental triggers. These may include food smells, smoky rooms, closed spaces, rooms with strong smells, warm or humid places, overly loud and noisy rooms, and rooms with motion or flickering lights. Try eating meals in a well-ventilated area that is free of strong smells. ? Quick and sudden changes in your movement. ? Taking iron pills and multivitamins that contain iron. If you take prescription iron pills, do not stop taking them unless your health care provider approves. ? Preparing food. The smell of food can spoil your appetite or trigger nausea.  To help relieve your symptoms: ? Listen to your body. Everyone is different and has different preferences. Find what works best for you. ? Eat and drink slowly. ? Eat 5-6 small meals daily instead of 3 large meals. Eating small meals and snacks can help you avoid an empty stomach. ? In the morning, before getting out of bed, eat a couple of crackers to avoid moving around on an empty stomach. ? Try eating starchy foods as these are usually tolerated well. Examples include cereal, toast, bread, potatoes, pasta, rice, and pretzels. ? Include at least 1 serving of protein with your meals and snacks. Protein options include  lean meats, poultry, seafood, beans, nuts, nut butters, eggs, cheese, and yogurt. °? Try eating a protein-rich snack before bed. Examples of a protein-rick snack include cheese and crackers or a peanut butter sandwich made with 1 slice of whole-wheat bread and 1 tsp (5 g) of peanut butter. °? Eat or suck on things that have ginger in them.  It may help relieve nausea. Add ¼ tsp ground ginger to hot tea or choose ginger tea. °? Try drinking 100% fruit juice or an electrolyte drink. An electrolyte drink contains sodium, potassium, and chloride. °? Drink fluids that are cold, clear, and carbonated or sour. Examples include lemonade, ginger ale, lemon-lime soda, ice water, and sparkling water. °? Brush your teeth or use a mouth rinse after meals. °? Talk with your health care provider about starting a supplement of vitamin B6. °General instructions °· Take over-the-counter and prescription medicines only as told by your health care provider. °· Follow instructions from your health care provider about eating or drinking restrictions. °· Continue to take your prenatal vitamins as told by your health care provider. If you are having trouble taking your prenatal vitamins, talk with your health care provider about different options. °· Keep all follow-up and pre-birth (prenatal) visits as told by your health care provider. This is important. °Contact a health care provider if: °· You have pain in your abdomen. °· You have a severe headache. °· You have vision problems. °· You are losing weight. °· You feel weak or dizzy. °Get help right away if: °· You cannot drink fluids without vomiting. °· You vomit blood. °· You have constant nausea and vomiting. °· You are very weak. °· You faint. °· You have a fever and your symptoms suddenly get worse. °Summary °· Hyperemesis gravidarum is a severe form of nausea and vomiting that happens during pregnancy. °· Making some changes to your eating habits may help relieve nausea and vomiting. °· This condition may be managed with medicine. °· If medicines do not help relieve nausea and vomiting, you may need to receive fluids through an IV at the hospital. °This information is not intended to replace advice given to you by your health care provider. Make sure you discuss any questions you have with your health care  provider. °Document Released: 03/04/2005 Document Revised: 03/24/2017 Document Reviewed: 11/01/2015 °Elsevier Patient Education © 2020 [employer] ° ° °

## 2019-01-07 NOTE — MAU Note (Addendum)
Error in charting.

## 2019-01-15 LAB — OB RESULTS CONSOLE RUBELLA ANTIBODY, IGM: Rubella: IMMUNE

## 2019-01-15 LAB — OB RESULTS CONSOLE RPR: RPR: NONREACTIVE

## 2019-01-15 LAB — OB RESULTS CONSOLE ANTIBODY SCREEN: Antibody Screen: NEGATIVE

## 2019-01-15 LAB — OB RESULTS CONSOLE GC/CHLAMYDIA
Chlamydia: NEGATIVE
Gonorrhea: NEGATIVE

## 2019-01-15 LAB — OB RESULTS CONSOLE GBS: GBS: POSITIVE

## 2019-01-15 LAB — OB RESULTS CONSOLE HIV ANTIBODY (ROUTINE TESTING): HIV: NONREACTIVE

## 2019-01-15 LAB — OB RESULTS CONSOLE ABO/RH: RH Type: POSITIVE

## 2019-01-15 LAB — OB RESULTS CONSOLE HEPATITIS B SURFACE ANTIGEN: Hepatitis B Surface Ag: NEGATIVE

## 2019-01-26 ENCOUNTER — Encounter (HOSPITAL_COMMUNITY): Payer: Self-pay

## 2019-01-26 ENCOUNTER — Inpatient Hospital Stay (HOSPITAL_COMMUNITY)
Admission: AD | Admit: 2019-01-26 | Discharge: 2019-01-26 | Disposition: A | Discharge status: Home / Self Care | Payer: Managed Care, Other (non HMO) | Attending: Obstetrics & Gynecology | Admitting: Obstetrics & Gynecology

## 2019-01-26 ENCOUNTER — Other Ambulatory Visit: Payer: Self-pay

## 2019-01-26 DIAGNOSIS — J45909 Unspecified asthma, uncomplicated: Secondary | ICD-10-CM | POA: Diagnosis not present

## 2019-01-26 DIAGNOSIS — Z79899 Other long term (current) drug therapy: Secondary | ICD-10-CM | POA: Diagnosis not present

## 2019-01-26 DIAGNOSIS — R1013 Epigastric pain: Secondary | ICD-10-CM | POA: Diagnosis not present

## 2019-01-26 DIAGNOSIS — O26892 Other specified pregnancy related conditions, second trimester: Secondary | ICD-10-CM | POA: Insufficient documentation

## 2019-01-26 DIAGNOSIS — R109 Unspecified abdominal pain: Secondary | ICD-10-CM | POA: Diagnosis not present

## 2019-01-26 DIAGNOSIS — O99512 Diseases of the respiratory system complicating pregnancy, second trimester: Secondary | ICD-10-CM | POA: Diagnosis not present

## 2019-01-26 DIAGNOSIS — O219 Vomiting of pregnancy, unspecified: Secondary | ICD-10-CM

## 2019-01-26 DIAGNOSIS — Z3A14 14 weeks gestation of pregnancy: Secondary | ICD-10-CM | POA: Insufficient documentation

## 2019-01-26 LAB — COMPREHENSIVE METABOLIC PANEL
ALT: 16 U/L (ref 0–44)
AST: 17 U/L (ref 15–41)
Albumin: 3.3 g/dL — ABNORMAL LOW (ref 3.5–5.0)
Alkaline Phosphatase: 34 U/L — ABNORMAL LOW (ref 38–126)
Anion gap: 9 (ref 5–15)
BUN: 5 mg/dL — ABNORMAL LOW (ref 6–20)
CO2: 23 mmol/L (ref 22–32)
Calcium: 9 mg/dL (ref 8.9–10.3)
Chloride: 100 mmol/L (ref 98–111)
Creatinine, Ser: 0.63 mg/dL (ref 0.44–1.00)
GFR calc Af Amer: >60 mL/min (ref >60)
GFR calc non Af Amer: >60 mL/min (ref >60)
Glucose, Bld: 89 mg/dL (ref 70–99)
Potassium: 3.5 mmol/L (ref 3.5–5.1)
Sodium: 132 mmol/L — ABNORMAL LOW (ref 135–145)
Total Bilirubin: 0.5 mg/dL (ref 0.3–1.2)
Total Protein: 6.7 g/dL (ref 6.5–8.1)

## 2019-01-26 LAB — RAPID URINE DRUG SCREEN, HOSP PERFORMED
Amphetamines: NOT DETECTED
Barbiturates: NOT DETECTED
Benzodiazepines: NOT DETECTED
Cocaine: NOT DETECTED
Opiates: NOT DETECTED
Tetrahydrocannabinol: POSITIVE — AB

## 2019-01-26 LAB — URINALYSIS, ROUTINE W REFLEX MICROSCOPIC
Bilirubin Urine: NEGATIVE
Glucose, UA: NEGATIVE mg/dL
Hgb urine dipstick: NEGATIVE
Ketones, ur: 20 mg/dL — AB
Nitrite: NEGATIVE
Protein, ur: 30 mg/dL — AB
Specific Gravity, Urine: 1.027 (ref 1.005–1.030)
pH: 6 (ref 5.0–8.0)

## 2019-01-26 LAB — CBC
HCT: 33.8 % — ABNORMAL LOW (ref 36.0–46.0)
Hemoglobin: 11.3 g/dL — ABNORMAL LOW (ref 12.0–15.0)
MCH: 27.9 pg (ref 26.0–34.0)
MCHC: 33.4 g/dL (ref 30.0–36.0)
MCV: 83.5 fL (ref 80.0–100.0)
Platelets: 265 10*3/uL (ref 150–400)
RBC: 4.05 MIL/uL (ref 3.87–5.11)
RDW: 13.9 % (ref 11.5–15.5)
WBC: 8.2 10*3/uL (ref 4.0–10.5)
nRBC: 0 % (ref 0.0–0.2)

## 2019-01-26 LAB — LIPASE, BLOOD: Lipase: 19 U/L (ref 11–51)

## 2019-01-26 LAB — AMYLASE: Amylase: 99 U/L (ref 28–100)

## 2019-01-26 MED ORDER — LIDOCAINE VISCOUS HCL 2 % MT SOLN
15.0000 mL | Freq: Once | OROMUCOSAL | Status: AC
  Administered 2019-01-26: 20:00:00 15 mL via ORAL
  Filled 2019-01-26: qty 15

## 2019-01-26 MED ORDER — ALUM & MAG HYDROXIDE-SIMETH 200-200-20 MG/5ML PO SUSP
30.0000 mL | Freq: Once | ORAL | Status: AC
  Administered 2019-01-26: 20:00:00 30 mL via ORAL
  Filled 2019-01-26: qty 30

## 2019-01-26 MED ORDER — SUCRALFATE 1 G PO TABS: 1.0000 g | ORAL_TABLET | Freq: Four times a day (QID) | ORAL | 1 refills | Status: DC

## 2019-01-26 NOTE — MAU Provider Note (Addendum)
Chief Complaint: Abdominal Pain and Emesis   First Provider Initiated Contact with Patient 01/26/19 1925     SUBJECTIVE HPI: Holly Thornton is a 28 y.o. G3P1011 at [redacted]w[redacted]d who presents to Maternity Admissions reporting epigastric pain & n/v. Has had HEG throughout this pregnancy. Current regimen includes pepcid, reglan, diclegis, & scop patch. Has used all of her meds today but removed her scop patch during her shower & has not reapplied. Prior to today, she has had good control & vomits once daily. She has vomited 4 times today. Epigastric pain started last night. Describes as throbbing pain that radiates to her mid back. Nothing makes better or worse. Didn't eat yesterday. Has been able to keep down bacon, water, & cranberry juice today. Denies lower abdominal pain or vaginal bleeding. Denies fever/chills, CP, cough, or SOB.   Location: mid epigastrum Quality: throbbing Severity: 8/10 on pain scale Duration: 1 day Timing: constant Modifying factors: nothing Associated signs and symptoms: nausea  Past Medical History:  Diagnosis Date  . ADHD (attention deficit hyperactivity disorder)   . Asthma    child  . Colitis    at age 15  . Ectopic pregnancy   . Frequent UTI   . Herpes genitalis in women    OB History  Gravida Para Term Preterm AB Living  3 1 1   1 1   SAB TAB Ectopic Multiple Live Births      1 1 1     # Outcome Date GA Lbr Len/2nd Weight Sex Delivery Anes PTL Lv  3 Current           2 Ectopic 07/2018          1A Term 02/10/15 [redacted]w[redacted]d 07:47 / 01:49 3150 g F Vag-Spont EPI  LIV  1B Term            Past Surgical History:  Procedure Laterality Date  . NO PAST SURGERIES     Social History   Socioeconomic History  . Marital status: Married    Spouse name: Not on file  . Number of children: 0  . Years of education: Not on file  . Highest education level: Not on file  Occupational History  . Occupation: college  Social Needs  . Financial resource strain: Not on file   . Food insecurity    Worry: Not on file    Inability: Not on file  . Transportation needs    Medical: Not on file    Non-medical: Not on file  Tobacco Use  . Smoking status: Never Smoker  . Smokeless tobacco: Never Used  Substance and Sexual Activity  . Alcohol use: Not Currently    Alcohol/week: 1.0 standard drinks    Types: 1 Standard drinks or equivalent per week  . Drug use: No  . Sexual activity: Yes    Birth control/protection: None  Lifestyle  . Physical activity    Days per week: Not on file    Minutes per session: Not on file  . Stress: Not on file  Relationships  . Social Herbalist on phone: Not on file    Gets together: Not on file    Attends religious service: Not on file    Active member of club or organization: Not on file    Attends meetings of clubs or organizations: Not on file    Relationship status: Not on file  . Intimate partner violence    Fear of current or ex partner: Not on file  Emotionally abused: Not on file    Physically abused: Not on file    Forced sexual activity: Not on file  Other Topics Concern  . Not on file  Social History Narrative   Regular exercise:  Yes, walks   Caffeine use:  2 glasses soda daily            Family History  Problem Relation Age of Onset  . Depression Mother   . Fibroids Mother   . Anemia Mother   . Diabetes Maternal Grandmother    No current facility-administered medications on file prior to encounter.    Current Outpatient Medications on File Prior to Encounter  Medication Sig Dispense Refill  . doxylamine, Sleep, (UNISOM) 25 MG tablet Take 1 tablet (25 mg total) by mouth at bedtime. 30 tablet 0  . famotidine (PEPCID) 20 MG tablet Take 1 tablet (20 mg total) by mouth 2 (two) times daily. 60 tablet 1  . prochlorperazine (COMPAZINE) 10 MG tablet Take 1 tablet (10 mg total) by mouth every 6 (six) hours as needed for nausea or vomiting. 30 tablet 1  . pyridOXINE 50 MG TABS Take 50 mg by  mouth 3 (three) times daily. 90 tablet 1  . scopolamine (TRANSDERM-SCOP) 1 MG/3DAYS Place 1 patch (1.5 mg total) onto the skin every 3 (three) days. 10 patch 1   Allergies  Allergen Reactions  . Amoxicillin Anaphylaxis and Swelling    Has patient had a PCN reaction causing immediate rash, facial/tongue/throat swelling, SOB or lightheadedness with hypotension: Yes Has patient had a PCN reaction causing severe rash involving mucus membranes or skin necrosis: No Has patient had a PCN reaction that required hospitalization No Has patient had a PCN reaction occurring within the last 10 years: No If all of the above answers are "NO", then may proceed with Cephalosporin use.   Marland Kitchen Peanut-Containing Drug Products Anaphylaxis, Hives, Diarrhea, Rash and Other (See Comments)    Puffiness of the eyes     I have reviewed patient's Past Medical Hx, Surgical Hx, Family Hx, Social Hx, medications and allergies.   Review of Systems  Constitutional: Negative.   Respiratory: Negative.   Cardiovascular: Negative.   Gastrointestinal: Positive for abdominal pain, nausea and vomiting. Negative for constipation and diarrhea.  Genitourinary: Negative.     OBJECTIVE Patient Vitals for the past 24 hrs:  BP Temp Temp src Pulse Resp SpO2 Weight  01/26/19 2212 108/64 - - 82 - - -  01/26/19 1843 109/70 98.6 F (37 C) Oral (!) 104 16 99 % 68.4 kg   Constitutional: Well-developed, well-nourished female in no acute distress.  Cardiovascular: normal rate & rhythm, no murmur Respiratory: normal rate and effort. Lung sounds clear throughout GI: Tender throughout epigastric area. Negative murphy's sign.  Abd soft, Pos BS x 4. No guarding or rebound tenderness MS: Extremities nontender, no edema, normal ROM Neurologic: Alert and oriented x 4.     LAB RESULTS Results for orders placed or performed during the hospital encounter of 01/26/19 (from the past 24 hour(s))  Urine rapid drug screen (hosp performed)      Status: Abnormal   Collection Time: 01/26/19  7:25 PM  Result Value Ref Range   Opiates NONE DETECTED NONE DETECTED   Cocaine NONE DETECTED NONE DETECTED   Benzodiazepines NONE DETECTED NONE DETECTED   Amphetamines NONE DETECTED NONE DETECTED   Tetrahydrocannabinol POSITIVE (A) NONE DETECTED   Barbiturates NONE DETECTED NONE DETECTED  Urinalysis, Routine w reflex microscopic     Status:  Abnormal   Collection Time: 01/26/19  7:30 PM  Result Value Ref Range   Color, Urine AMBER (A) YELLOW   APPearance CLOUDY (A) CLEAR   Specific Gravity, Urine 1.027 1.005 - 1.030   pH 6.0 5.0 - 8.0   Glucose, UA NEGATIVE NEGATIVE mg/dL   Hgb urine dipstick NEGATIVE NEGATIVE   Bilirubin Urine NEGATIVE NEGATIVE   Ketones, ur 20 (A) NEGATIVE mg/dL   Protein, ur 30 (A) NEGATIVE mg/dL   Nitrite NEGATIVE NEGATIVE   Leukocytes,Ua LARGE (A) NEGATIVE   RBC / HPF 0-5 0 - 5 RBC/hpf   WBC, UA 21-50 0 - 5 WBC/hpf   Bacteria, UA FEW (A) NONE SEEN   Squamous Epithelial / LPF 11-20 0 - 5   Mucus PRESENT   CBC     Status: Abnormal   Collection Time: 01/26/19  8:11 PM  Result Value Ref Range   WBC 8.2 4.0 - 10.5 K/uL   RBC 4.05 3.87 - 5.11 MIL/uL   Hemoglobin 11.3 (L) 12.0 - 15.0 g/dL   HCT 33.8 (L) 36.0 - 46.0 %   MCV 83.5 80.0 - 100.0 fL   MCH 27.9 26.0 - 34.0 pg   MCHC 33.4 30.0 - 36.0 g/dL   RDW 13.9 11.5 - 15.5 %   Platelets 265 150 - 400 K/uL   nRBC 0.0 0.0 - 0.2 %  Comprehensive metabolic panel     Status: Abnormal   Collection Time: 01/26/19  8:11 PM  Result Value Ref Range   Sodium 132 (L) 135 - 145 mmol/L   Potassium 3.5 3.5 - 5.1 mmol/L   Chloride 100 98 - 111 mmol/L   CO2 23 22 - 32 mmol/L   Glucose, Bld 89 70 - 99 mg/dL   BUN 5 (L) 6 - 20 mg/dL   Creatinine, Ser 0.63 0.44 - 1.00 mg/dL   Calcium 9.0 8.9 - 10.3 mg/dL   Total Protein 6.7 6.5 - 8.1 g/dL   Albumin 3.3 (L) 3.5 - 5.0 g/dL   AST 17 15 - 41 U/L   ALT 16 0 - 44 U/L   Alkaline Phosphatase 34 (L) 38 - 126 U/L   Total Bilirubin  0.5 0.3 - 1.2 mg/dL   GFR calc non Af Amer >60 >60 mL/min   GFR calc Af Amer >60 >60 mL/min   Anion gap 9 5 - 15  Amylase     Status: None   Collection Time: 01/26/19  8:11 PM  Result Value Ref Range   Amylase 99 28 - 100 U/L  Lipase, blood     Status: None   Collection Time: 01/26/19  8:11 PM  Result Value Ref Range   Lipase 19 11 - 51 U/L    IMAGING No results found.  MAU COURSE Orders Placed This Encounter  Procedures  . Urinalysis, Routine w reflex microscopic  . Urine rapid drug screen (hosp performed)  . CBC  . Comprehensive metabolic panel  . Amylase  . Lipase, blood   Meds ordered this encounter  Medications  . AND Linked Order Group   . alum & mag hydroxide-simeth (MAALOX/MYLANTA) 200-200-20 MG/5ML suspension 30 mL   . lidocaine (XYLOCAINE) 2 % viscous mouth solution 15 mL  . sucralfate (CARAFATE) 1 g tablet    Sig: Take 1 tablet (1 g total) by mouth 4 (four) times daily.    Dispense:  120 tablet    Refill:  1    MDM FHT present via dopple  Maalox & viscous lidocaine  ordered CBC, CMP, amylase, lipase ordered Pt has had some weight gain since last MAU visit.   Care turned over to Dr. Rubye Beach, Junie Panning, NP 01/26/2019  8:00 PM  Care assumed from Jorje Guild, NP  ASSESSMENT and PLAN:  28 y.o. G3P1011 at [redacted]w[redacted]d who presents to Maternity Admissions reporting epigastric pain & n/v, which she had had throughout this pregnancy. -feels much better after GI cocktail -script for Carafate sent to pharmacy -DC to home with strict return precautions -Follow up with OB provider as scheduled  Brok Stocking, Dan Europe, DO OB Fellow, [facility] 01/27/2019 1:35 AM

## 2019-01-26 NOTE — Discharge Instructions (Signed)
Morning Sickness  Morning sickness is when you feel sick to your stomach (nauseous) during pregnancy. You may feel sick to your stomach and throw up (vomit). You may feel sick in the morning, but you can feel this way at any time of day. Some women feel very sick to their stomach and cannot stop throwing up (hyperemesis gravidarum). Follow these instructions at home: Medicines  Take over-the-counter and prescription medicines only as told by your doctor. Do not take any medicines until you talk with your doctor about them first.  Taking multivitamins before getting pregnant can stop or lessen the harshness of morning sickness. Eating and drinking  Eat dry toast or crackers before getting out of bed.  Eat 5 or 6 small meals a day.  Eat dry and bland foods like rice and baked potatoes.  Do not eat greasy, fatty, or spicy foods.  Have someone cook for you if the smell of food causes you to feel sick or throw up.  If you feel sick to your stomach after taking prenatal vitamins, take them at night or with a snack.  Eat protein when you need a snack. Nuts, yogurt, and cheese are good choices.  Drink fluids throughout the day.  Try ginger ale made with real ginger, ginger tea made from fresh grated ginger, or ginger candies. General instructions  Do not use any products that have nicotine or tobacco in them, such as cigarettes and e-cigarettes. If you need help quitting, ask your doctor.  Use an air purifier to keep the air in your house free of smells.  Get lots of fresh air.  Try to avoid smells that make you feel sick.  Try: ? Wearing a bracelet that is used for seasickness (acupressure wristband). ? Going to a doctor who puts thin needles into certain body points (acupuncture) to improve how you feel. Contact a doctor if:  You need medicine to feel better.  You feel dizzy or light-headed.  You are losing weight. Get help right away if:  You feel very sick to your  stomach and cannot stop throwing up.  You pass out (faint).  You have very bad pain in your belly. Summary  Morning sickness is when you feel sick to your stomach (nauseous) during pregnancy.  You may feel sick in the morning, but you can feel this way at any time of day.  Making some changes to what you eat may help your symptoms go away. This information is not intended to replace advice given to you by your health care provider. Make sure you discuss any questions you have with your health care provider. Document Released: 04/11/2004 Document Revised: 02/14/2017 Document Reviewed: 04/04/2016 Elsevier Patient Education  2020 [employer]  

## 2019-01-26 NOTE — MAU Note (Signed)
Been having consistent pain in upper abd.  Started yesterday.  Is causing her to vomit.

## 2019-03-19 NOTE — L&D Delivery Note (Signed)
Delivery Note Labor onset:  07/22/19 Labor Onset Time: 1611 Complete dilation at  1909 Onset of pushing at 1910 FHR second stage Cat 1 Analgesia/Anesthesia intrapartum: Epidural  Guided pushing with maternal urge. Delivery of a viable female at 64. Fetal head delivered in direct OA position and restituted to ROA. Nuchal cord none. Infant placed on maternal abd, dried, and tactile stim to produce lusty cry.  Cord double clamped after pulsation stopped and cut by Erlene Quan, father.  Cord blood sample collected Arterial cord blood sample n/a.  Placenta delivered Delena Bali, intact, with 3 VC.  Placenta to pathology for extra lobe. Uterine tone firm bleeding small, no clots  No laceration identified.  Anesthesia: N.A Repair: N/A QBL (mL): 99991111 Complications: none APGAR: APGAR (1 MIN):   APGAR (5 MINS):   APGAR (10 MINS):   Mom to postpartum.  Baby to Couplet care / Skin to Skin.  Arrie Eastern MSN, CNM 07/22/2019, 8:01 PM

## 2019-04-08 ENCOUNTER — Other Ambulatory Visit: Payer: Self-pay | Admitting: Family Medicine

## 2019-05-21 ENCOUNTER — Encounter (HOSPITAL_COMMUNITY): Payer: Self-pay | Admitting: Obstetrics and Gynecology

## 2019-05-21 ENCOUNTER — Inpatient Hospital Stay (HOSPITAL_COMMUNITY)
Admission: AD | Admit: 2019-05-21 | Discharge: 2019-05-21 | Disposition: A | Discharge status: Home / Self Care | Payer: Medicaid Other | Attending: Obstetrics and Gynecology | Admitting: Obstetrics and Gynecology

## 2019-05-21 ENCOUNTER — Other Ambulatory Visit: Payer: Self-pay

## 2019-05-21 DIAGNOSIS — Z0371 Encounter for suspected problem with amniotic cavity and membrane ruled out: Secondary | ICD-10-CM

## 2019-05-21 DIAGNOSIS — O98813 Other maternal infectious and parasitic diseases complicating pregnancy, third trimester: Secondary | ICD-10-CM | POA: Insufficient documentation

## 2019-05-21 DIAGNOSIS — B373 Candidiasis of vulva and vagina: Secondary | ICD-10-CM | POA: Diagnosis not present

## 2019-05-21 DIAGNOSIS — Z3A31 31 weeks gestation of pregnancy: Secondary | ICD-10-CM | POA: Insufficient documentation

## 2019-05-21 DIAGNOSIS — Z880 Allergy status to penicillin: Secondary | ICD-10-CM | POA: Diagnosis not present

## 2019-05-21 DIAGNOSIS — R109 Unspecified abdominal pain: Secondary | ICD-10-CM | POA: Insufficient documentation

## 2019-05-21 DIAGNOSIS — B3731 Acute candidiasis of vulva and vagina: Secondary | ICD-10-CM

## 2019-05-21 LAB — AMNISURE RUPTURE OF MEMBRANE (ROM) NOT AT ARMC: Amnisure ROM: NEGATIVE

## 2019-05-21 LAB — URINALYSIS, ROUTINE W REFLEX MICROSCOPIC
Bilirubin Urine: NEGATIVE
Glucose, UA: NEGATIVE mg/dL
Hgb urine dipstick: NEGATIVE
Ketones, ur: NEGATIVE mg/dL
Nitrite: NEGATIVE
Protein, ur: NEGATIVE mg/dL
Specific Gravity, Urine: 1.012 (ref 1.005–1.030)
pH: 6 (ref 5.0–8.0)

## 2019-05-21 LAB — WET PREP, GENITAL
Clue Cells Wet Prep HPF POC: NONE SEEN
Sperm: NONE SEEN
Trich, Wet Prep: NONE SEEN

## 2019-05-21 MED ORDER — TERCONAZOLE 0.4 % VA CREA: 1.0000 | TOPICAL_CREAM | Freq: Every day | VAGINAL | 0 refills | Status: DC

## 2019-05-21 MED ORDER — ACETAMINOPHEN 500 MG PO TABS
1000.0000 mg | ORAL_TABLET | Freq: Once | ORAL | Status: AC
  Administered 2019-05-21: 1000 mg via ORAL
  Filled 2019-05-21: qty 2

## 2019-05-21 NOTE — MAU Provider Note (Signed)
History     CSN: [id number]  Arrival date and time: 05/21/19 1047   First Provider Initiated Contact with Patient 05/21/19 1145      Chief Complaint  Patient presents with  . Vaginal Discharge  . Abdominal Cramping   HPI  Holly Thornton is a 29 y.o. female G19P1011 @ G3P1011 here in MAU with complaints of abdominal cramping That started last night. The cramping kept her up last night. She feels the cramping at the top or her abdomen and in the side. No vaginal bleeding.  States for the last 2 morning she has woken up with wet underwear. Throughout the day she notices some wetness and has to change her underwear. The fluid is clear in color. + fetal movement.   Past Medical History:  Diagnosis Date  . ADHD (attention deficit hyperactivity disorder)   . Asthma    child  . Colitis    at age 75  . Ectopic pregnancy   . Frequent UTI   . Herpes genitalis in women     Past Surgical History:  Procedure Laterality Date  . NO PAST SURGERIES      Family History  Problem Relation Age of Onset  . Depression Mother   . Fibroids Mother   . Anemia Mother   . Diabetes Maternal Grandmother     Social History   Tobacco Use  . Smoking status: Never Smoker  . Smokeless tobacco: Never Used  Substance Use Topics  . Alcohol use: Not Currently    Alcohol/week: 1.0 standard drinks    Types: 1 Standard drinks or equivalent per week  . Drug use: No    Allergies:  Allergies  Allergen Reactions  . Amoxicillin Anaphylaxis and Swelling    Has patient had a PCN reaction causing immediate rash, facial/tongue/throat swelling, SOB or lightheadedness with hypotension: Yes Has patient had a PCN reaction causing severe rash involving mucus membranes or skin necrosis: No Has patient had a PCN reaction that required hospitalization No Has patient had a PCN reaction occurring within the last 10 years: No If all of the above answers are "NO", then may proceed with Cephalosporin use.   Marland Kitchen  Peanut-Containing Drug Products Anaphylaxis, Hives, Diarrhea, Rash and Other (See Comments)    Puffiness of the eyes     No medications prior to admission.   Review of Systems  Gastrointestinal: Positive for abdominal pain.  Genitourinary: Positive for vaginal discharge. Negative for vaginal bleeding.   Physical Exam   Blood pressure 117/74, pulse 95, temperature 98.3 F (36.8 C), temperature source Oral, resp. rate 16, height 5\' 4"  (1.626 m), weight 77.4 kg, last menstrual period 10/15/2018, SpO2 99 %, unknown if currently breastfeeding.  Physical Exam  Constitutional: She is oriented to person, place, and time. She appears well-developed and well-nourished. No distress.  Respiratory: Effort normal.  GI: Soft. She exhibits no distension and no mass. There is no abdominal tenderness. There is no rebound and no guarding.  Genitourinary:    Vaginal discharge present.     Genitourinary Comments: Vagina - copious amount of thick, clumpy white/yellow vaginal discharge, no odor  Cervix - No contact bleeding, no active bleeding  Bimanual exam: Cervix closed, thick, posterior.  GC/Chlam, wet prep done Chaperone present for exam.    Musculoskeletal:        General: Normal range of motion.  Neurological: She is alert and oriented to person, place, and time.  Skin: Skin is warm. She is not diaphoretic.  Psychiatric: Her  behavior is normal.  Fetal Tracing: Baseline: 145 bpm Variability: moderate  Accelerations: 15x15 Decelerations: None Toco: None  MAU Course  Procedures  None  MDM  Amnisure negative FFN collected however not sent: cervix closed, thick, posterior.  Tylenol 1 gram given Wet prep confirms vaginal yeast.   Assessment and Plan   A:  1. Encounter for suspected premature rupture of amniotic membranes, with rupture of membranes not found   2. Vaginal yeast infection   3. [redacted] weeks gestation of pregnancy     P:  Discharge home in stable condition Rx: Terazol  cream x7 days F/u with OB as needed, or sooner if change in symptoms Return to MAU if symptoms worsen  OB urine culture pending.    Lezlie Lye, NP. 05/21/2019 2:33 PM

## 2019-05-21 NOTE — MAU Note (Signed)
Pt has been waking up with "soaked" underwear for 2 nights. States discharge is clear and odorless. No itching, burning. Has had abdominal cramps since midnight. Denies any bleeding.  +FM

## 2019-05-21 NOTE — Discharge Instructions (Signed)
Vaginal Yeast Infection, Adult  Vaginal yeast infection is a condition that causes vaginal discharge as well as soreness, swelling, and redness (inflammation) of the vagina. This is a common condition. Some women get this infection frequently. What are the causes? This condition is caused by a change in the normal balance of the yeast (candida) and bacteria that live in the vagina. This change causes an overgrowth of yeast, which causes the inflammation. What increases the risk? The condition is more likely to develop in women who:  Take antibiotic medicines.  Have diabetes.  Take birth control pills.  Are pregnant.  Douche often.  Have a weak body defense system (immune system).  Have been taking steroid medicines for a long time.  Frequently wear tight clothing. What are the signs or symptoms? Symptoms of this condition include:  White, thick, creamy vaginal discharge.  Swelling, itching, redness, and irritation of the vagina. The lips of the vagina (vulva) may be affected as well.  Pain or a burning feeling while urinating.  Pain during sex. How is this diagnosed? This condition is diagnosed based on:  Your medical history.  A physical exam.  A pelvic exam. Your health care provider will examine a sample of your vaginal discharge under a microscope. Your health care provider may send this sample for testing to confirm the diagnosis. How is this treated? This condition is treated with medicine. Medicines may be over-the-counter or prescription. You may be told to use one or more of the following:  Medicine that is taken by mouth (orally).  Medicine that is applied as a cream (topically).  Medicine that is inserted directly into the vagina (suppository). Follow these instructions at home:  Lifestyle  Do not have sex until your health care provider approves. Tell your sex partner that you have a yeast infection. That person should go to his or her health care  provider and ask if they should also be treated.  Do not wear tight clothes, such as pantyhose or tight pants.  Wear breathable cotton underwear. General instructions  Take or apply over-the-counter and prescription medicines only as told by your health care provider.  Eat more yogurt. This may help to keep your yeast infection from returning.  Do not use tampons until your health care provider approves.  Try taking a sitz bath to help with discomfort. This is a warm water bath that is taken while you are sitting down. The water should only come up to your hips and should cover your buttocks. Do this 3-4 times per day or as told by your health care provider.  Do not douche.  If you have diabetes, keep your blood sugar levels under control.  Keep all follow-up visits as told by your health care provider. This is important. Contact a health care provider if:  You have a fever.  Your symptoms go away and then return.  Your symptoms do not get better with treatment.  Your symptoms get worse.  You have new symptoms.  You develop blisters in or around your vagina.  You have blood coming from your vagina and it is not your menstrual period.  You develop pain in your abdomen. Summary  Vaginal yeast infection is a condition that causes discharge as well as soreness, swelling, and redness (inflammation) of the vagina.  This condition is treated with medicine. Medicines may be over-the-counter or prescription.  Take or apply over-the-counter and prescription medicines only as told by your health care provider.  Do not douche.   Do not have sex or use tampons until your health care provider approves.  Contact a health care provider if your symptoms do not get better with treatment or your symptoms go away and then return. This information is not intended to replace advice given to you by your health care provider. Make sure you discuss any questions you have with your health care  provider. Document Revised: 10/02/2018 Document Reviewed: 07/21/2017 Elsevier Patient Education  2020 [employer]  

## 2019-05-22 LAB — CULTURE, OB URINE
Culture: NO GROWTH
Special Requests: NORMAL

## 2019-05-24 LAB — GC/CHLAMYDIA PROBE AMP (~~LOC~~) NOT AT ARMC
Chlamydia: NEGATIVE
Comment: NEGATIVE
Comment: NORMAL
Neisseria Gonorrhea: NEGATIVE

## 2019-06-18 ENCOUNTER — Inpatient Hospital Stay (HOSPITAL_COMMUNITY)
Admission: AD | Admit: 2019-06-18 | Discharge: 2019-06-18 | Disposition: A | Discharge status: Home / Self Care | Payer: Medicaid Other | Attending: Obstetrics and Gynecology | Admitting: Obstetrics and Gynecology

## 2019-06-18 ENCOUNTER — Encounter (HOSPITAL_COMMUNITY): Payer: Self-pay | Admitting: Obstetrics and Gynecology

## 2019-06-18 ENCOUNTER — Other Ambulatory Visit: Payer: Self-pay

## 2019-06-18 DIAGNOSIS — B3731 Acute candidiasis of vulva and vagina: Secondary | ICD-10-CM

## 2019-06-18 DIAGNOSIS — O98813 Other maternal infectious and parasitic diseases complicating pregnancy, third trimester: Secondary | ICD-10-CM | POA: Diagnosis not present

## 2019-06-18 DIAGNOSIS — B373 Candidiasis of vulva and vagina: Secondary | ICD-10-CM | POA: Diagnosis not present

## 2019-06-18 DIAGNOSIS — O26853 Spotting complicating pregnancy, third trimester: Secondary | ICD-10-CM

## 2019-06-18 DIAGNOSIS — Z3689 Encounter for other specified antenatal screening: Secondary | ICD-10-CM

## 2019-06-18 DIAGNOSIS — Z3A35 35 weeks gestation of pregnancy: Secondary | ICD-10-CM | POA: Diagnosis not present

## 2019-06-18 DIAGNOSIS — N898 Other specified noninflammatory disorders of vagina: Secondary | ICD-10-CM | POA: Diagnosis present

## 2019-06-18 DIAGNOSIS — Z880 Allergy status to penicillin: Secondary | ICD-10-CM | POA: Diagnosis not present

## 2019-06-18 LAB — WET PREP, GENITAL
Clue Cells Wet Prep HPF POC: NONE SEEN
Sperm: NONE SEEN
Trich, Wet Prep: NONE SEEN

## 2019-06-18 LAB — POCT FERN TEST: POCT Fern Test: NEGATIVE

## 2019-06-18 MED ORDER — FLUCONAZOLE 150 MG PO TABS: 150.0000 mg | ORAL_TABLET | Freq: Once | ORAL | 0 refills | Status: AC

## 2019-06-18 NOTE — MAU Provider Note (Signed)
History   [id number]   Chief Complaint  Patient presents with  . Vaginal Discharge    HPI Holly Thornton is a 29 y.o. female  G3P1011 @35 .1 wks here with report of vaginal discharge. Reports 2 days of watery and clumpy discharge. No gush of fluid. Denies itching or malodor. She was treated last month for a yeast infection, she completed her medication. Pt reports BH contractions. She denies vaginal bleeding. She reports good fetal movement. All other systems negative.    Patient's last menstrual period was 10/15/2018.  OB History  Gravida Para Term Preterm AB Living  3 1 1   1 1   SAB TAB Ectopic Multiple Live Births      1 1 1     # Outcome Date GA Lbr Len/2nd Weight Sex Delivery Anes PTL Lv  3 Current           2 Ectopic 07/2018          1A Term 02/10/15 [redacted]w[redacted]d 07:47 / 01:49 3150 g F Vag-Spont EPI  LIV  1B Term             Past Medical History:  Diagnosis Date  . ADHD (attention deficit hyperactivity disorder)   . Asthma    child  . Colitis    at age 70  . Ectopic pregnancy   . Frequent UTI   . Herpes genitalis in women     Family History  Problem Relation Age of Onset  . Depression Mother   . Fibroids Mother   . Anemia Mother   . Diabetes Maternal Grandmother     Social History   Socioeconomic History  . Marital status: Married    Spouse name: Not on file  . Number of children: 0  . Years of education: Not on file  . Highest education level: Not on file  Occupational History  . Occupation: college  Tobacco Use  . Smoking status: Never Smoker  . Smokeless tobacco: Never Used  Substance and Sexual Activity  . Alcohol use: Not Currently    Alcohol/week: 1.0 standard drinks    Types: 1 Standard drinks or equivalent per week  . Drug use: No  . Sexual activity: Yes    Birth control/protection: None  Other Topics Concern  . Not on file  Social History Narrative   Regular exercise:  Yes, walks   Caffeine use:  2 glasses soda daily             Social Determinants of Health   Financial Resource Strain:   . Difficulty of Paying Living Expenses:   Food Insecurity:   . Worried About Charity fundraiser in the Last Year:   . Arboriculturist in the Last Year:   Transportation Needs:   . Film/video editor (Medical):   Marland Kitchen Lack of Transportation (Non-Medical):   Physical Activity:   . Days of Exercise per Week:   . Minutes of Exercise per Session:   Stress:   . Feeling of Stress :   Social Connections:   . Frequency of Communication with Friends and Family:   . Frequency of Social Gatherings with Friends and Family:   . Attends Religious Services:   . Active Member of Clubs or Organizations:   . Attends Archivist Meetings:   Marland Kitchen Marital Status:     Allergies  Allergen Reactions  . Amoxicillin Anaphylaxis and Swelling    Has patient had a PCN reaction causing immediate rash, facial/tongue/throat  swelling, SOB or lightheadedness with hypotension: Yes Has patient had a PCN reaction causing severe rash involving mucus membranes or skin necrosis: No Has patient had a PCN reaction that required hospitalization No Has patient had a PCN reaction occurring within the last 10 years: No If all of the above answers are "NO", then may proceed with Cephalosporin use.   Marland Kitchen Peanut-Containing Drug Products Anaphylaxis, Hives, Diarrhea, Rash and Other (See Comments)    Puffiness of the eyes     No current facility-administered medications on file prior to encounter.   Current Outpatient Medications on File Prior to Encounter  Medication Sig Dispense Refill  . doxylamine, Sleep, (UNISOM) 25 MG tablet Take 1 tablet (25 mg total) by mouth at bedtime. 30 tablet 0  . Prenatal Vit-Fe Fumarate-FA (MULTIVITAMIN-PRENATAL) 27-0.8 MG TABS tablet Take 1 tablet by mouth daily at 12 noon.    . famotidine (PEPCID) 20 MG tablet Take 1 tablet (20 mg total) by mouth 2 (two) times daily. 60 tablet 1  . prochlorperazine (COMPAZINE) 10 MG  tablet Take 1 tablet (10 mg total) by mouth every 6 (six) hours as needed for nausea or vomiting. 30 tablet 1  . pyridOXINE 50 MG TABS Take 50 mg by mouth 3 (three) times daily. 90 tablet 1  . scopolamine (TRANSDERM-SCOP) 1 MG/3DAYS Place 1 patch (1.5 mg total) onto the skin every 3 (three) days. 10 patch 1  . sucralfate (CARAFATE) 1 g tablet Take 1 tablet (1 g total) by mouth 4 (four) times daily. 120 tablet 1  . terconazole (TERAZOL 7) 0.4 % vaginal cream Place 1 applicator vaginally at bedtime. 45 g 0     Review of Systems  Gastrointestinal: Negative for abdominal pain.  Genitourinary: Positive for vaginal discharge. Negative for vaginal bleeding.     Physical Exam   Vitals:   06/18/19 1821 06/18/19 1844  BP: 122/72 124/70  Pulse: (!) 102 (!) 102  Resp: 17   Temp: 98.7 F (37.1 C)   TempSrc: Oral   SpO2: 96%   Weight: 81.8 kg   Height: 5\' 4"  (1.626 m)     Physical Exam  Nursing note and vitals reviewed. Constitutional: She is oriented to person, place, and time. She appears well-developed and well-nourished. No distress.  HENT:  Head: Normocephalic and atraumatic.  Cardiovascular: Normal rate.  Respiratory: Effort normal. No respiratory distress.  Genitourinary:    Genitourinary Comments: SSE: +pool, yellow adherent discharge, fern neg   Musculoskeletal:        General: Normal range of motion.     Cervical back: Normal range of motion.  Neurological: She is alert and oriented to person, place, and time.  Skin: Skin is warm and dry.  Psychiatric: She has a normal mood and affect.  EFM: 145 bpm, mod variability, + accels, no decels Toco: rare  Results for orders placed or performed during the hospital encounter of 06/18/19 (from the past 24 hour(s))  Wet prep, genital     Status: Abnormal   Collection Time: 06/18/19  7:12 PM   Specimen: Vaginal  Result Value Ref Range   Yeast Wet Prep HPF POC PRESENT (A) NONE SEEN   Trich, Wet Prep NONE SEEN NONE SEEN   Clue  Cells Wet Prep HPF POC NONE SEEN NONE SEEN   WBC, Wet Prep HPF POC MANY (A) NONE SEEN   Sperm NONE SEEN   POCT fern test     Status: None   Collection Time: 06/18/19  7:13 PM  Result  Value Ref Range   POCT Fern Test Negative = intact amniotic membranes    MAU Course  Procedures  MDM Labs ordered and reviewed. GC negative last month, will not repeat. No evidence of SROM. Will treat for yeast, took Terazol last, will Rx Diflucan. Stable for discharge home.   Assessment and Plan   1. [redacted] weeks gestation of pregnancy   2. NST (non-stress test) reactive   3. Yeast vaginitis    Discharge home Follow up at [facility] in 3 days PTL precautions Rx Diflucan  Allergies as of 06/18/2019      Reactions   Amoxicillin Anaphylaxis, Swelling   Has patient had a PCN reaction causing immediate rash, facial/tongue/throat swelling, SOB or lightheadedness with hypotension: Yes Has patient had a PCN reaction causing severe rash involving mucus membranes or skin necrosis: No Has patient had a PCN reaction that required hospitalization No Has patient had a PCN reaction occurring within the last 10 years: No If all of the above answers are "NO", then may proceed with Cephalosporin use.   Peanut-containing Drug Products Anaphylaxis, Hives, Diarrhea, Rash, Other (See Comments)   Puffiness of the eyes       Medication List    STOP taking these medications   sucralfate 1 g tablet Commonly known as: Carafate   terconazole 0.4 % vaginal cream Commonly known as: Terazol 7     TAKE these medications   B-6 50 MG Tabs Take 50 mg by mouth 3 (three) times daily.   doxylamine (Sleep) 25 MG tablet Commonly known as: UNISOM Take 1 tablet (25 mg total) by mouth at bedtime.   famotidine 20 MG tablet Commonly known as: PEPCID Take 1 tablet (20 mg total) by mouth 2 (two) times daily.   fluconazole 150 MG tablet Commonly known as: DIFLUCAN Take 1 tablet (150 mg total) by mouth once for 1 dose. May repeat dose  on day 4   multivitamin-prenatal 27-0.8 MG Tabs tablet Take 1 tablet by mouth daily at 12 noon.   prochlorperazine 10 MG tablet Commonly known as: COMPAZINE Take 1 tablet (10 mg total) by mouth every 6 (six) hours as needed for nausea or vomiting.   scopolamine 1 MG/3DAYS Commonly known as: TRANSDERM-SCOP Place 1 patch (1.5 mg total) onto the skin every 3 (three) days.      Julianne Handler, CNM 06/18/2019 7:36 PM

## 2019-06-18 NOTE — Discharge Instructions (Signed)
Vaginal Yeast Infection, Adult  Vaginal yeast infection is a condition that causes vaginal discharge as well as soreness, swelling, and redness (inflammation) of the vagina. This is a common condition. Some women get this infection frequently. What are the causes? This condition is caused by a change in the normal balance of the yeast (candida) and bacteria that live in the vagina. This change causes an overgrowth of yeast, which causes the inflammation. What increases the risk? The condition is more likely to develop in women who:  Take antibiotic medicines.  Have diabetes.  Take birth control pills.  Are pregnant.  Douche often.  Have a weak body defense system (immune system).  Have been taking steroid medicines for a long time.  Frequently wear tight clothing. What are the signs or symptoms? Symptoms of this condition include:  White, thick, creamy vaginal discharge.  Swelling, itching, redness, and irritation of the vagina. The lips of the vagina (vulva) may be affected as well.  Pain or a burning feeling while urinating.  Pain during sex. How is this diagnosed? This condition is diagnosed based on:  Your medical history.  A physical exam.  A pelvic exam. Your health care provider will examine a sample of your vaginal discharge under a microscope. Your health care provider may send this sample for testing to confirm the diagnosis. How is this treated? This condition is treated with medicine. Medicines may be over-the-counter or prescription. You may be told to use one or more of the following:  Medicine that is taken by mouth (orally).  Medicine that is applied as a cream (topically).  Medicine that is inserted directly into the vagina (suppository). Follow these instructions at home:  Lifestyle  Do not have sex until your health care provider approves. Tell your sex partner that you have a yeast infection. That person should go to his or her health care  provider and ask if they should also be treated.  Do not wear tight clothes, such as pantyhose or tight pants.  Wear breathable cotton underwear. General instructions  Take or apply over-the-counter and prescription medicines only as told by your health care provider.  Eat more yogurt. This may help to keep your yeast infection from returning.  Do not use tampons until your health care provider approves.  Try taking a sitz bath to help with discomfort. This is a warm water bath that is taken while you are sitting down. The water should only come up to your hips and should cover your buttocks. Do this 3-4 times per day or as told by your health care provider.  Do not douche.  If you have diabetes, keep your blood sugar levels under control.  Keep all follow-up visits as told by your health care provider. This is important. Contact a health care provider if:  You have a fever.  Your symptoms go away and then return.  Your symptoms do not get better with treatment.  Your symptoms get worse.  You have new symptoms.  You develop blisters in or around your vagina.  You have blood coming from your vagina and it is not your menstrual period.  You develop pain in your abdomen. Summary  Vaginal yeast infection is a condition that causes discharge as well as soreness, swelling, and redness (inflammation) of the vagina.  This condition is treated with medicine. Medicines may be over-the-counter or prescription.  Take or apply over-the-counter and prescription medicines only as told by your health care provider.  Do not douche.   Do not have sex or use tampons until your health care provider approves.  Contact a health care provider if your symptoms do not get better with treatment or your symptoms go away and then return. This information is not intended to replace advice given to you by your health care provider. Make sure you discuss any questions you have with your health care  provider. Document Revised: 10/02/2018 Document Reviewed: 07/21/2017 Elsevier Patient Education  2020 [employer]  

## 2019-06-18 NOTE — MAU Note (Signed)
Keeps having reaccurring yeast infections.  Knows that is the problem. Called her dr, no response. Came in to just get prescribed.  Having a lot of watery d/c.  Denies itching or irritation.

## 2019-07-01 ENCOUNTER — Other Ambulatory Visit: Payer: Self-pay

## 2019-07-01 NOTE — Progress Notes (Signed)
ERROR

## 2019-07-08 ENCOUNTER — Inpatient Hospital Stay (HOSPITAL_COMMUNITY)
Admission: AD | Admit: 2019-07-08 | Discharge: 2019-07-08 | Disposition: A | Discharge status: Home / Self Care | Payer: Medicaid Other | Attending: Obstetrics & Gynecology | Admitting: Obstetrics & Gynecology

## 2019-07-08 ENCOUNTER — Other Ambulatory Visit: Payer: Self-pay

## 2019-07-08 ENCOUNTER — Encounter (HOSPITAL_COMMUNITY): Payer: Self-pay | Admitting: Obstetrics & Gynecology

## 2019-07-08 DIAGNOSIS — Z3A38 38 weeks gestation of pregnancy: Secondary | ICD-10-CM | POA: Diagnosis not present

## 2019-07-08 DIAGNOSIS — O479 False labor, unspecified: Secondary | ICD-10-CM

## 2019-07-08 DIAGNOSIS — O471 False labor at or after 37 completed weeks of gestation: Secondary | ICD-10-CM | POA: Diagnosis present

## 2019-07-08 NOTE — MAU Note (Signed)
Pt reports ctx's that started yesterday. Pt reports they have increased today about every 7 minutes.   Denies vaginal bleeding or LOF.   Reports +FM

## 2019-07-08 NOTE — Discharge Instructions (Signed)
Fetal Movement Counts Patient Name: ________________________________________________ Patient Due Date: ____________________ What is a fetal movement count?  A fetal movement count is the number of times that you feel your baby move during a certain amount of time. This may also be called a fetal kick count. A fetal movement count is recommended for every pregnant woman. You may be asked to start counting fetal movements as early as week 28 of your pregnancy. Pay attention to when your baby is most active. You may notice your baby's sleep and wake cycles. You may also notice things that make your baby move more. You should do a fetal movement count:  When your baby is normally most active.  At the same time each day. A good time to count movements is while you are resting, after having something to eat and drink. How do I count fetal movements? 1. Find a quiet, comfortable area. Sit, or lie down on your side. 2. Write down the date, the start time and stop time, and the number of movements that you felt between those two times. Take this information with you to your health care visits. 3. Write down your start time when you feel the first movement. 4. Count kicks, flutters, swishes, rolls, and jabs. You should feel at least 10 movements. 5. You may stop counting after you have felt 10 movements, or if you have been counting for 2 hours. Write down the stop time. 6. If you do not feel 10 movements in 2 hours, contact your health care provider for further instructions. Your health care provider may want to do additional tests to assess your baby's well-being. Contact a health care provider if:  You feel fewer than 10 movements in 2 hours.  Your baby is not moving like he or she usually does. Date: ____________ Start time: ____________ Stop time: ____________ Movements: ____________ Date: ____________ Start time: ____________ Stop time: ____________ Movements: ____________ Date: ____________  Start time: ____________ Stop time: ____________ Movements: ____________ Date: ____________ Start time: ____________ Stop time: ____________ Movements: ____________ Date: ____________ Start time: ____________ Stop time: ____________ Movements: ____________ Date: ____________ Start time: ____________ Stop time: ____________ Movements: ____________ Date: ____________ Start time: ____________ Stop time: ____________ Movements: ____________ Date: ____________ Start time: ____________ Stop time: ____________ Movements: ____________ Date: ____________ Start time: ____________ Stop time: ____________ Movements: ____________ This information is not intended to replace advice given to you by your health care provider. Make sure you discuss any questions you have with your health care provider. Document Revised: 10/22/2018 Document Reviewed: 10/22/2018 Elsevier Patient Education  2020 [employer]        Signs and Symptoms of Labor Labor is your body's natural process of moving your baby, placenta, and umbilical cord out of your uterus. The process of labor usually starts when your baby is full-term, between 37 and 40 weeks of pregnancy. How will I know when I am close to going into labor? As your body prepares for labor and the birth of your baby, you may notice the following symptoms in the weeks and days before true labor starts:  Having a strong desire to get your home ready to receive your new baby. This is called nesting. Nesting may be a sign that labor is approaching, and it may occur several weeks before birth. Nesting may involve cleaning and organizing your home.  Passing a small amount of thick, bloody mucus out of your vagina (normal bloody show or losing your mucus plug). This may happen more than a   week before labor begins, or it might occur right before labor begins as the opening of the cervix starts to widen (dilate). For some women, the entire mucus plug passes at once. For others,  smaller portions of the mucus plug may gradually pass over several days.  Your baby moving (dropping) lower in your pelvis to get into position for birth (lightening). When this happens, you may feel more pressure on your bladder and pelvic bone and less pressure on your ribs. This may make it easier to breathe. It may also cause you to need to urinate more often and have problems with bowel movements.  Having "practice contractions" (Braxton Hicks contractions) that occur at irregular (unevenly spaced) intervals that are more than 10 minutes apart. This is also called false labor. False labor contractions are common after exercise or sexual activity, and they will stop if you change position, rest, or drink fluids. These contractions are usually mild and do not get stronger over time. They may feel like: ? A backache or back pain. ? Mild cramps, similar to menstrual cramps. ? Tightening or pressure in your abdomen. Other early symptoms that labor may be starting soon include:  Nausea or loss of appetite.  Diarrhea.  Having a sudden burst of energy, or feeling very tired.  Mood changes.  Having trouble sleeping. How will I know when labor has begun? Signs that true labor has begun may include:  Having contractions that come at regular (evenly spaced) intervals and increase in intensity. This may feel like more intense tightening or pressure in your abdomen that moves to your back. ? Contractions may also feel like rhythmic pain in your upper thighs or back that comes and goes at regular intervals. ? For first-time mothers, this change in intensity of contractions often occurs at a more gradual pace. ? Women who have given birth before may notice a more rapid progression of contraction changes.  Having a feeling of pressure in the vaginal area.  Your water breaking (rupture of membranes). This is when the sac of fluid that surrounds your baby breaks. When this happens, you will notice  fluid leaking from your vagina. This may be clear or blood-tinged. Labor usually starts within 24 hours of your water breaking, but it may take longer to begin. ? Some women notice this as a gush of fluid. ? Others notice that their underwear repeatedly becomes damp. Follow these instructions at home:   When labor starts, or if your water breaks, call your health care provider or nurse care line. Based on your situation, they will determine when you should go in for an exam.  When you are in early labor, you may be able to rest and manage symptoms at home. Some strategies to try at home include: ? Breathing and relaxation techniques. ? Taking a warm bath or shower. ? Listening to music. ? Using a heating pad on the lower back for pain. If you are directed to use heat:  Place a towel between your skin and the heat source.  Leave the heat on for 20-30 minutes.  Remove the heat if your skin turns bright red. This is especially important if you are unable to feel pain, heat, or cold. You may have a greater risk of getting burned. Get help right away if:  You have painful, regular contractions that are 5 minutes apart or less.  Labor starts before you are [redacted] weeks along in your pregnancy.  You have a fever.  You have   a headache that does not go away.  You have bright red blood coming from your vagina.  You do not feel your baby moving.  You have a sudden onset of: ? Severe headache with vision problems. ? Nausea, vomiting, or diarrhea. ? Chest pain or shortness of breath. These symptoms may be an emergency. If your health care provider recommends that you go to the hospital or birth center where you plan to deliver, do not drive yourself. Have someone else drive you, or call emergency services (911 in the [place] Summary  Labor is your body's natural process of moving your baby, placenta, and umbilical cord out of your uterus.  The process of labor usually starts when your baby is  full-term, between 37 and 40 weeks of pregnancy.  When labor starts, or if your water breaks, call your health care provider or nurse care line. Based on your situation, they will determine when you should go in for an exam. This information is not intended to replace advice given to you by your health care provider. Make sure you discuss any questions you have with your health care provider. Document Revised: 12/02/2016 Document Reviewed: 08/09/2016 Elsevier Patient Education  2020 [employer] Inc[employer]  

## 2019-07-08 NOTE — MAU Note (Signed)
I have communicated with Bobbie Stack, CNM and reviewed vital signs:  Vitals:   07/08/19 1705 07/08/19 1802  BP: 122/71 113/74  Pulse: (!) 110 93  Resp:    Temp:    SpO2: 98%     Vaginal exam:  Dilation: 1 Effacement (%): Thick Cervical Position: Posterior Station: Ballotable Exam by:: Elray Mcgregor, RN [phone],   Also reviewed contraction pattern and that non-stress test is reactive.  It has been documented that patient is contracting irregularly not indicating active labor.  Patient denies any other complaints.  Based on this report provider has given order for discharge.  A discharge order and diagnosis entered by a provider.   Labor discharge instructions reviewed with patient.

## 2019-07-19 ENCOUNTER — Telehealth (HOSPITAL_COMMUNITY): Payer: Self-pay | Admitting: *Deleted

## 2019-07-19 NOTE — Telephone Encounter (Signed)
Preadmission screen  

## 2019-07-21 ENCOUNTER — Encounter (HOSPITAL_COMMUNITY): Payer: Self-pay | Admitting: *Deleted

## 2019-07-21 ENCOUNTER — Telehealth (HOSPITAL_COMMUNITY): Payer: Self-pay | Admitting: *Deleted

## 2019-07-21 NOTE — Telephone Encounter (Signed)
Preadmission screen  

## 2019-07-22 ENCOUNTER — Encounter (HOSPITAL_COMMUNITY): Payer: Self-pay | Admitting: Obstetrics & Gynecology

## 2019-07-22 ENCOUNTER — Inpatient Hospital Stay (HOSPITAL_COMMUNITY): Payer: Medicaid Other | Admitting: Anesthesiology

## 2019-07-22 ENCOUNTER — Inpatient Hospital Stay (HOSPITAL_COMMUNITY)
Admission: AD | Admit: 2019-07-22 | Discharge: 2019-07-24 | DRG: 807 | Disposition: A | Discharge status: Home / Self Care | Payer: Medicaid Other | Attending: Obstetrics & Gynecology | Admitting: Obstetrics & Gynecology

## 2019-07-22 ENCOUNTER — Other Ambulatory Visit: Payer: Self-pay

## 2019-07-22 DIAGNOSIS — Z20822 Contact with and (suspected) exposure to covid-19: Secondary | ICD-10-CM | POA: Diagnosis present

## 2019-07-22 DIAGNOSIS — Z880 Allergy status to penicillin: Secondary | ICD-10-CM

## 2019-07-22 DIAGNOSIS — O99214 Obesity complicating childbirth: Principal | ICD-10-CM | POA: Diagnosis present

## 2019-07-22 DIAGNOSIS — O99824 Streptococcus B carrier state complicating childbirth: Secondary | ICD-10-CM | POA: Diagnosis present

## 2019-07-22 DIAGNOSIS — Z3A40 40 weeks gestation of pregnancy: Secondary | ICD-10-CM | POA: Diagnosis not present

## 2019-07-22 DIAGNOSIS — E669 Obesity, unspecified: Secondary | ICD-10-CM | POA: Diagnosis present

## 2019-07-22 DIAGNOSIS — O26893 Other specified pregnancy related conditions, third trimester: Secondary | ICD-10-CM | POA: Diagnosis present

## 2019-07-22 LAB — COMPREHENSIVE METABOLIC PANEL
ALT: 18 U/L (ref 0–44)
AST: 22 U/L (ref 15–41)
Albumin: 3.1 g/dL — ABNORMAL LOW (ref 3.5–5.0)
Alkaline Phosphatase: 178 U/L — ABNORMAL HIGH (ref 38–126)
Anion gap: 11 (ref 5–15)
BUN: 8 mg/dL (ref 6–20)
CO2: 20 mmol/L — ABNORMAL LOW (ref 22–32)
Calcium: 9.2 mg/dL (ref 8.9–10.3)
Chloride: 103 mmol/L (ref 98–111)
Creatinine, Ser: 0.62 mg/dL (ref 0.44–1.00)
GFR calc Af Amer: >60 mL/min (ref >60)
GFR calc non Af Amer: >60 mL/min (ref >60)
Glucose, Bld: 104 mg/dL — ABNORMAL HIGH (ref 70–99)
Potassium: 3.8 mmol/L (ref 3.5–5.1)
Sodium: 134 mmol/L — ABNORMAL LOW (ref 135–145)
Total Bilirubin: 0.3 mg/dL (ref 0.3–1.2)
Total Protein: 7 g/dL (ref 6.5–8.1)

## 2019-07-22 LAB — URINALYSIS, ROUTINE W REFLEX MICROSCOPIC
Bilirubin Urine: NEGATIVE
Glucose, UA: NEGATIVE mg/dL
Ketones, ur: NEGATIVE mg/dL
Nitrite: NEGATIVE
Protein, ur: NEGATIVE mg/dL
Specific Gravity, Urine: 1.015 (ref 1.005–1.030)
pH: 6 (ref 5.0–8.0)

## 2019-07-22 LAB — TYPE AND SCREEN
ABO/RH(D): O POS
Antibody Screen: NEGATIVE

## 2019-07-22 LAB — CBC
HCT: 39.4 % (ref 36.0–46.0)
Hemoglobin: 12.8 g/dL (ref 12.0–15.0)
MCH: 28.2 pg (ref 26.0–34.0)
MCHC: 32.5 g/dL (ref 30.0–36.0)
MCV: 86.8 fL (ref 80.0–100.0)
Platelets: 187 10*3/uL (ref 150–400)
RBC: 4.54 MIL/uL (ref 3.87–5.11)
RDW: 14.1 % (ref 11.5–15.5)
WBC: 8 10*3/uL (ref 4.0–10.5)
nRBC: 0 % (ref 0.0–0.2)

## 2019-07-22 LAB — PROTEIN / CREATININE RATIO, URINE
Creatinine, Urine: 89.77 mg/dL
Protein Creatinine Ratio: 0.17 mg/mg{Cre} — ABNORMAL HIGH (ref 0.00–0.15)
Total Protein, Urine: 15 mg/dL

## 2019-07-22 LAB — RESPIRATORY PANEL BY RT PCR (FLU A&B, COVID)
Influenza A by PCR: NEGATIVE
Influenza B by PCR: NEGATIVE
SARS Coronavirus 2 by RT PCR: NEGATIVE

## 2019-07-22 LAB — RPR: RPR Ser Ql: NONREACTIVE

## 2019-07-22 MED ORDER — TETANUS-DIPHTH-ACELL PERTUSSIS 5-2.5-18.5 LF-MCG/0.5 IM SUSP: 0.5000 mL | Freq: Once | INTRAMUSCULAR | Status: DC

## 2019-07-22 MED ORDER — OXYCODONE-ACETAMINOPHEN 5-325 MG PO TABS: 1.0000 | ORAL_TABLET | ORAL | Status: DC | PRN

## 2019-07-22 MED ORDER — ONDANSETRON HCL 4 MG PO TABS: 4.0000 mg | ORAL_TABLET | ORAL | Status: DC | PRN

## 2019-07-22 MED ORDER — OXYTOCIN 40 UNITS IN NORMAL SALINE INFUSION - SIMPLE MED: 1.0000 m[IU]/min | INTRAVENOUS | Status: DC

## 2019-07-22 MED ORDER — EPHEDRINE 5 MG/ML INJ: 10.0000 mg | INTRAVENOUS | Status: DC | PRN

## 2019-07-22 MED ORDER — ACETAMINOPHEN 500 MG PO TABS
500.0000 mg | ORAL_TABLET | Freq: Four times a day (QID) | ORAL | Status: DC | PRN
  Administered 2019-07-23: 500 mg via ORAL
  Filled 2019-07-22: qty 1

## 2019-07-22 MED ORDER — IBUPROFEN 600 MG PO TABS
600.0000 mg | ORAL_TABLET | Freq: Four times a day (QID) | ORAL | Status: DC
  Administered 2019-07-23 – 2019-07-24 (×7): 600 mg via ORAL
  Filled 2019-07-22 (×7): qty 1

## 2019-07-22 MED ORDER — OXYCODONE-ACETAMINOPHEN 5-325 MG PO TABS: 2.0000 | ORAL_TABLET | ORAL | Status: DC | PRN

## 2019-07-22 MED ORDER — LACTATED RINGERS IV SOLN
500.0000 mL | Freq: Once | INTRAVENOUS | Status: AC
  Administered 2019-07-22: 500 mL via INTRAVENOUS

## 2019-07-22 MED ORDER — FENTANYL-BUPIVACAINE-NACL 0.5-0.125-0.9 MG/250ML-% EP SOLN: 12.0000 mL/h | EPIDURAL | Status: DC | PRN

## 2019-07-22 MED ORDER — SODIUM CHLORIDE (PF) 0.9 % IJ SOLN
INTRAMUSCULAR | Status: DC | PRN
  Administered 2019-07-22: 12 mL/h via EPIDURAL

## 2019-07-22 MED ORDER — LIDOCAINE HCL (PF) 1 % IJ SOLN: 30.0000 mL | INTRAMUSCULAR | Status: DC | PRN

## 2019-07-22 MED ORDER — LIDOCAINE HCL (PF) 1 % IJ SOLN
INTRAMUSCULAR | Status: DC | PRN
  Administered 2019-07-22: 10 mL via EPIDURAL
  Administered 2019-07-22: 2 mL via EPIDURAL

## 2019-07-22 MED ORDER — COCONUT OIL OIL
1.0000 "application " | TOPICAL_OIL | Status: DC | PRN
  Administered 2019-07-23: 1 via TOPICAL

## 2019-07-22 MED ORDER — SODIUM CHLORIDE 0.9 % IV SOLN
2.0000 g | Freq: Once | INTRAVENOUS | Status: AC
  Administered 2019-07-22: 2 g via INTRAVENOUS
  Filled 2019-07-22: qty 2000

## 2019-07-22 MED ORDER — TERBUTALINE SULFATE 1 MG/ML IJ SOLN: 0.2500 mg | Freq: Once | INTRAMUSCULAR | Status: DC | PRN

## 2019-07-22 MED ORDER — WITCH HAZEL-GLYCERIN EX PADS: 1.0000 "application " | MEDICATED_PAD | CUTANEOUS | Status: DC | PRN

## 2019-07-22 MED ORDER — ONDANSETRON HCL 4 MG/2ML IJ SOLN: 4.0000 mg | Freq: Four times a day (QID) | INTRAMUSCULAR | Status: DC | PRN

## 2019-07-22 MED ORDER — ACETAMINOPHEN 325 MG PO TABS: 650.0000 mg | ORAL_TABLET | ORAL | Status: DC | PRN

## 2019-07-22 MED ORDER — DIBUCAINE (PERIANAL) 1 % EX OINT: 1.0000 "application " | TOPICAL_OINTMENT | CUTANEOUS | Status: DC | PRN

## 2019-07-22 MED ORDER — DIPHENHYDRAMINE HCL 25 MG PO CAPS: 25.0000 mg | ORAL_CAPSULE | Freq: Four times a day (QID) | ORAL | Status: DC | PRN

## 2019-07-22 MED ORDER — SENNOSIDES-DOCUSATE SODIUM 8.6-50 MG PO TABS
2.0000 | ORAL_TABLET | ORAL | Status: DC
  Administered 2019-07-23 (×2): 2 via ORAL
  Filled 2019-07-22 (×2): qty 2

## 2019-07-22 MED ORDER — PHENYLEPHRINE 40 MCG/ML (10ML) SYRINGE FOR IV PUSH (FOR BLOOD PRESSURE SUPPORT)
80.0000 ug | PREFILLED_SYRINGE | INTRAVENOUS | Status: DC | PRN
  Administered 2019-07-22: 80 ug via INTRAVENOUS

## 2019-07-22 MED ORDER — BENZOCAINE-MENTHOL 20-0.5 % EX AERO: 1.0000 "application " | INHALATION_SPRAY | CUTANEOUS | Status: DC | PRN

## 2019-07-22 MED ORDER — FENTANYL-BUPIVACAINE-NACL 0.5-0.125-0.9 MG/250ML-% EP SOLN
EPIDURAL | Status: AC
  Filled 2019-07-22: qty 250

## 2019-07-22 MED ORDER — FENTANYL CITRATE (PF) 100 MCG/2ML IJ SOLN
100.0000 ug | Freq: Once | INTRAMUSCULAR | Status: AC
  Administered 2019-07-22: 11:00:00 100 ug via INTRAVENOUS

## 2019-07-22 MED ORDER — SIMETHICONE 80 MG PO CHEW: 80.0000 mg | CHEWABLE_TABLET | ORAL | Status: DC | PRN

## 2019-07-22 MED ORDER — OXYTOCIN BOLUS FROM INFUSION
500.0000 mL | Freq: Once | INTRAVENOUS | Status: AC
  Administered 2019-07-22: 500 mL via INTRAVENOUS

## 2019-07-22 MED ORDER — ONDANSETRON HCL 4 MG/2ML IJ SOLN: 4.0000 mg | INTRAMUSCULAR | Status: DC | PRN

## 2019-07-22 MED ORDER — DIPHENHYDRAMINE HCL 50 MG/ML IJ SOLN: 12.5000 mg | INTRAMUSCULAR | Status: DC | PRN

## 2019-07-22 MED ORDER — PRENATAL MULTIVITAMIN CH
1.0000 | ORAL_TABLET | Freq: Every day | ORAL | Status: DC
  Administered 2019-07-23 – 2019-07-24 (×2): 1 via ORAL
  Filled 2019-07-22 (×2): qty 1

## 2019-07-22 MED ORDER — SODIUM CHLORIDE 0.9 % IV SOLN
2.0000 g | Freq: Four times a day (QID) | INTRAVENOUS | Status: DC
  Administered 2019-07-22: 2 g via INTRAVENOUS
  Filled 2019-07-22 (×3): qty 2000

## 2019-07-22 MED ORDER — SOD CITRATE-CITRIC ACID 500-334 MG/5ML PO SOLN: 30.0000 mL | ORAL | Status: DC | PRN

## 2019-07-22 MED ORDER — LACTATED RINGERS IV SOLN: 500.0000 mL | INTRAVENOUS | Status: DC | PRN

## 2019-07-22 MED ORDER — OXYTOCIN 40 UNITS IN NORMAL SALINE INFUSION - SIMPLE MED
2.5000 [IU]/h | INTRAVENOUS | Status: DC
  Administered 2019-07-22: 2.5 [IU]/h via INTRAVENOUS
  Filled 2019-07-22: qty 1000

## 2019-07-22 MED ORDER — LACTATED RINGERS IV SOLN: INTRAVENOUS | Status: DC

## 2019-07-22 MED ORDER — FENTANYL CITRATE (PF) 100 MCG/2ML IJ SOLN
INTRAMUSCULAR | Status: AC
  Filled 2019-07-22: qty 2

## 2019-07-22 MED ORDER — PHENYLEPHRINE 40 MCG/ML (10ML) SYRINGE FOR IV PUSH (FOR BLOOD PRESSURE SUPPORT)
80.0000 ug | PREFILLED_SYRINGE | INTRAVENOUS | Status: DC | PRN
  Filled 2019-07-22: qty 10

## 2019-07-22 NOTE — Anesthesia Preprocedure Evaluation (Addendum)
Anesthesia Evaluation  Patient identified by MRN, date of birth, ID band Patient awake    Reviewed: Allergy & Precautions, Patient's Chart, lab work & pertinent test results  Airway Mallampati: II  TM Distance: >3 FB Neck ROM: Full    Dental no notable dental hx. (+) Dental Advisory Given, Teeth Intact   Pulmonary asthma ,    Pulmonary exam normal breath sounds clear to auscultation       Cardiovascular negative cardio ROS Normal cardiovascular exam Rhythm:Regular Rate:Normal     Neuro/Psych PSYCHIATRIC DISORDERS ADHDnegative neurological ROS     GI/Hepatic Neg liver ROS, GERD  Medicated and Controlled,  Endo/Other  Obesity BMI 33  Renal/GU negative Renal ROS  negative genitourinary   Musculoskeletal negative musculoskeletal ROS (+)   Abdominal (+) + obese,   Peds negative pediatric ROS (+)  Hematology negative hematology ROS (+) hct 39.4, plt 187   Anesthesia Other Findings   Reproductive/Obstetrics (+) Pregnancy                             Anesthesia Physical Anesthesia Plan  ASA: III and emergent  Anesthesia Plan: Epidural   Post-op Pain Management:    Induction:   PONV Risk Score and Plan: 2  Airway Management Planned: Natural Airway  Additional Equipment: None  Intra-op Plan:   Post-operative Plan:   Informed Consent: I have reviewed the patients History and Physical, chart, labs and discussed the procedure including the risks, benefits and alternatives for the proposed anesthesia with the patient or authorized representative who has indicated his/her understanding and acceptance.       Plan Discussed with:   Anesthesia Plan Comments:         Anesthesia Quick Evaluation

## 2019-07-22 NOTE — H&P (Signed)
OB ADMISSION/ HISTORY & PHYSICAL:  Admission Date: 07/22/2019  8:31 AM  Admit Diagnosis: Normal labor at term  Holly Thornton is a 29 y.o. female G3P1011 [redacted]w[redacted]d presenting for ctx. Endorses active FM and bloody show, denies LOF. Ctx began @ 0800 on 07/21/19. Discomfort increased @ 0600 today. Pt reports allergy to Amoxicillin, but was treated with Ampicillin in the first trimester and tolerated treated w/o a reaction. GBS positive, will plan to treat w/ Ampicillin in labor  History of current pregnancy: G3P1011   Patient entered care with [facility] at 6 wks. Close F/U R/T ectopic in 07/2018 that was resolved w/ MTX therapy   EDC of 07/22/19 was established by certain LMP and congruent w/ first trimester U/S.   Anatomy scan:  20+1 wks, with normal findings and right lateral placenta.   Antenatal testing: N/A Hx: GBS in urine on new OB labs HSV ADHD-as a child, takes no meds Seizure disorder-as a child, takes no meds Patient Active Problem List   Diagnosis Date Noted  . Normal labor 07/22/2019  . Hyperemesis gravidarum 12/17/2018    Class: Diagnosis of  . Mild tetrahydrocannabinol (THC) abuse 12/13/2018  . History of colitis 02/10/2015  . History of frequent urinary tract infections 02/10/2015  . Allergy to drug - Amoxicillin - anaphylaxis and swelling 02/10/2015  . 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 ADHD 03/01/2009    Prenatal Labs: ABO, Rh: --/--/O POS (05/06 [id number]) Antibody: NEG (05/06 [id number]) Rubella: Immune (10/30 0000)  RPR: NON REACTIVE (05/06 0918)  HBsAg: Negative (10/30 0000)  HIV: Non-reactive (10/30 0000)  GTT: passed 1 hr GBS: Positive/-- (10/30 0000)  GC/CHL: neg/neg Genetics: low risk female, negative horizon Sickle cell/Hgb electrophoresis: AA   OB History  Gravida Para Term Preterm AB Living  3 1 1   1 1   SAB TAB Ectopic Multiple Live Births      1 1 1     #  Outcome Date GA Lbr Len/2nd Weight Sex Delivery Anes PTL Lv  3 Current           2 Ectopic 07/2018          1A Term 02/10/15 [redacted]w[redacted]d 07:47 / 01:49 3150 g F Vag-Spont EPI  LIV  1B Term             Medical / Surgical History: Past medical history:  Past Medical History:  Diagnosis Date  . ADHD (attention deficit hyperactivity disorder)   . Asthma    child  . Colitis    at age 69  . Ectopic pregnancy   . Frequent UTI   . Herpes genitalis in women     Past surgical history:  Past Surgical History:  Procedure Laterality Date  . NO PAST SURGERIES     Family History:  Family History  Problem Relation Age of Onset  . Depression Mother   . Fibroids Mother   . Anemia Mother   . Diabetes Maternal Grandmother     Social History:  reports that she has never smoked. She has never used smokeless tobacco. She reports previous alcohol use of about 1.0 standard drinks of alcohol per week. She reports that she does not use drugs.  Allergies: Amoxicillin and Peanut-containing drug products   Current Medications at time of admission:  Prior to Admission medications   Medication Sig Start Date End Date Taking? Authorizing Provider  doxylamine, Sleep, (UNISOM) 25 MG tablet Take  1 tablet (25 mg total) by mouth at bedtime. 12/20/18  Yes [place], Jade, FNP  famotidine (PEPCID) 20 MG tablet Take 1 tablet (20 mg total) by mouth 2 (two) times daily. 12/20/18 12/20/19 Yes [place], [place], FNP  Prenatal Vit-Fe Fumarate-FA (MULTIVITAMIN-PRENATAL) 27-0.8 MG TABS tablet Take 1 tablet by mouth daily at 12 noon.   Yes [provider]  prochlorperazine (COMPAZINE) 10 MG tablet Take 1 tablet (10 mg total) by mouth every 6 (six) hours as needed for nausea or vomiting. 01/07/19  Yes Julianne Handler, CNM  pyridOXINE 50 MG TABS Take 50 mg by mouth 3 (three) times daily. 01/07/19  Yes Julianne Handler, CNM  scopolamine (TRANSDERM-SCOP) 1 MG/3DAYS Place 1 patch (1.5 mg total) onto the skin every 3 (three) days.  01/10/19  Yes Julianne Handler, CNM    Review of Systems: Constitutional: Negative   HENT: Negative   Eyes: Negative   Respiratory: Negative   Cardiovascular: Negative   Gastrointestinal: Negative  Genitourinary: positive for bloody show, negative for LOF   Musculoskeletal: Negative   Skin: Negative   Neurological: Negative   Endo/Heme/Allergies: Negative   Psychiatric/Behavioral: Negative    Physical Exam: VS: Blood pressure 122/75, pulse 87, temperature 98 F (36.7 C), temperature source Oral, resp. rate 15, height 5\' 4"  (1.626 m), weight 86.2 kg, last menstrual period 10/15/2018, SpO2 95 %, unknown if currently breastfeeding. AAO x3, no signs of distress Cardiovascular: RRR Respiratory: Lung fields clear to ausculation GU/GI: Abdomen gravid, non-tender, non-distended, active FM, vertex, EFW 8# per Leopold's, pelvis proven to 6# 15 oz Extremities: trace edema, negative for pain, tenderness, and cords  Cervical exam:Dilation: 6 Effacement (%): 80 Station: -1 Exam by:: S Earl RNC FHR: baseline rate 125 / variability moderate / accelerations present / absent decelerations TOCO: 2-3   Prenatal Transfer Tool  Maternal Diabetes: No Genetic Screening: Normal Maternal Ultrasounds/Referrals: Normal Fetal Ultrasounds or other Referrals:  None Maternal Substance Abuse:  No Significant Maternal Medications:  None Significant Maternal Lab Results: Group B Strep positive    Assessment: 29 y.o. G3P1011 [redacted]w[redacted]d  Active stage of labor FHR category 1 GBS positive Hx HSV    -no lesions appreciated on spec exam Pain management plan: Epidural   Plan:  Admit to L&D Routine admission orders Epidural PRN Ampicillin for GBS prophylaxis Dr Alesia Richards notified of admission and plan of care  Arrie Eastern CNM, MSN 07/22/2019 12:21 PM

## 2019-07-22 NOTE — Anesthesia Procedure Notes (Signed)
Epidural Patient location during procedure: OB Start time: 07/22/2019 10:44 AM End time: 07/22/2019 10:57 AM  Staffing Anesthesiologist: Pervis Hocking, DO Performed: anesthesiologist   Preanesthetic Checklist Completed: patient identified, IV checked, risks and benefits discussed, monitors and equipment checked, pre-op evaluation and timeout performed  Epidural Patient position: sitting Prep: DuraPrep and site prepped and draped Patient monitoring: continuous pulse ox, blood pressure, heart rate and cardiac monitor Approach: midline Location: L3-L4 Injection technique: LOR air  Needle:  Needle type: Tuohy  Needle gauge: 17 G Needle length: 9 cm Needle insertion depth: 7 cm Catheter type: closed end flexible Catheter size: 19 Gauge Catheter at skin depth: 13 cm Test dose: negative  Assessment Sensory level: T8 Events: blood not aspirated, injection not painful, no injection resistance, no paresthesia and negative IV test  Additional Notes Patient identified. Risks/Benefits/Options discussed with patient including but not limited to bleeding, infection, nerve damage, paralysis, failed block, incomplete pain control, headache, blood pressure changes, nausea, vomiting, reactions to medication both or allergic, itching and postpartum back pain. Confirmed with bedside nurse the patient's most recent platelet count. Confirmed with patient that they are not currently taking any anticoagulation, have any bleeding history or any family history of bleeding disorders. Patient expressed understanding and wished to proceed. All questions were answered. Sterile technique was used throughout the entire procedure. Please see nursing notes for vital signs. Test dose was given through epidural catheter and negative prior to continuing to dose epidural or start infusion. Warning signs of high block given to the patient including shortness of breath, tingling/numbness in hands, complete motor  block, or any concerning symptoms with instructions to call for help. Patient was given instructions on fall risk and not to get out of bed. All questions and concerns addressed with instructions to call with any issues or inadequate analgesia.  Reason for block:procedure for pain

## 2019-07-22 NOTE — Progress Notes (Signed)
Subjective:    Comfortable w/ epidural. Pt confirms that she has been received penicillin this pregnancy and did not have a reaction. Pt has tolerated Ampicillin w/o reaction.   Objective:    VS: BP 121/75   Pulse (!) 106   Temp 98.8 F (37.1 C) (Oral)   Resp 16   Ht 5\' 4"  (1.626 m)   Wt 86.2 kg   LMP 10/15/2018   SpO2 98%   BMI 32.61 kg/m  FHR : baseline 130 / variability mpderate / accelerations present / absent decelerations Toco: contractions every 2-4 minutes / moderate-strong per palpation Membranes: SROM, thick mec Dilation: 8 Effacement (%): 90 Cervical Position: Anterior Station: 0 Presentation: Vertex Exam by::Zelene Barga, cnm  Assessment/Plan:   29 y.o. [id number] [redacted]w[redacted]d  Labor: Progressing normally Preeclampsia:  no signs or symptoms of toxicity and urine PCR pending Fetal Wellbeing:  Category I Pain Control:  Epidural I/D:  gbs positive, ampicillin x1 dose, second dose infusing Anticipated MOD:  NSVD  Arrie Eastern CNM, MSN 07/22/2019 7:54 PM

## 2019-07-23 LAB — CBC
HCT: 36.3 % (ref 36.0–46.0)
Hemoglobin: 11.9 g/dL — ABNORMAL LOW (ref 12.0–15.0)
MCH: 28.7 pg (ref 26.0–34.0)
MCHC: 32.8 g/dL (ref 30.0–36.0)
MCV: 87.7 fL (ref 80.0–100.0)
Platelets: 169 10*3/uL (ref 150–400)
RBC: 4.14 MIL/uL (ref 3.87–5.11)
RDW: 14.2 % (ref 11.5–15.5)
WBC: 11.3 10*3/uL — ABNORMAL HIGH (ref 4.0–10.5)
nRBC: 0 % (ref 0.0–0.2)

## 2019-07-23 NOTE — Clinical Social Work Maternal (Signed)
CLINICAL SOCIAL WORK MATERNAL/CHILD NOTE  Patient Details  Name: Holly Thornton MRN: [id number] Date of Birth: October 30, 1990  Date:  07/23/2019  Clinical Social Worker Initiating Note:  Durward Fortes, LCSW Date/Time: Initiated:  07/23/19/0915     Child's Name:  Holly Thornton   Biological Parents:  Mother, Father(Holly and Holly Thornton)   Need for Interpreter:  None   Reason for Referral:  Current Substance Use/Substance Use During Pregnancy    Address:  [place] [address]    Phone number:  [phone] (home)     Additional phone number: none   Household Members/Support Persons (HM/SP):   Household Member/Support Person 2   HM/SP Name Relationship DOB or Age  HM/SP -44  Holly Thornton   FOB     HM/SP -2 Holly Thornton MOB  87  HM/SP -3   Holly Thornton  daughter    35 years old   HM/SP -4        HM/SP -5        HM/SP -6        HM/SP -7        HM/SP -8          Natural Supports (not living in the home):  Parent   Professional Supports: Therapist   Employment: Unemployed   Type of Work: not at this time   Education:  Forensic psychologist   Homebound arranged:  n/a  Museum/gallery curator Resources:  Medicaid   Other Resources:    none desired   Cultural/Religious Considerations Which May Impact Care:  none reported.   Strengths:  Ability to meet basic needs , Compliance with medical plan , Home prepared for child , Pediatrician chosen   Psychotropic Medications:      None reported.    Pediatrician:    [place] area  Pediatrician List:   [facility] ([address])  [place]      Pediatrician Fax Number:    Risk Factors/Current Problems:  None   Cognitive State:  Able to Concentrate , Insightful , Alert    Mood/Affect:  Relaxed , Comfortable , Calm , Interested    CSW Assessment: CSW consulted as MOB had  a hx of THC use earlier in pregnancy. CSW spoke with MOB at bedside to address further needs.   CSW congratulated MOB and FOB on the birth of infant. CSW advised MOB of the HIPPA policy in which MOB was agreeable to having FOB remain in the room. CSW understanding and advised MOB of CSW's role and the reason for CSW coming to speak with her. MOB reported that she last used [facility] in November right before pregnancy was confirmed. MOB reported that she had no other THC or substances use during her pregnancy. CSW very understanding and advised MOB of the hospital drug screen policy. MOB was advised that infants UDS is negative, therefore CSW would monitor infants CDS and make CPS report if needed. MOB reported that she understood and expressed no CPS hx. MOB denies SI and HI.   CSW inquired from MOB on her mental health hx. MOB denies having any mental health diagnosis and reports that she has been feeling great since giving birth. MOB reported that she has all needed items to care for infant with no other needs. MOB has supports from her mom and spouses mom.  CSW took time to provide MOB with PPD and SIDS education. MOB was given PPD Checklist in order to keep track of her feelings as they relate to PPD. MOB thanked CSW and reported no other needs.   CSW will continue to monitor infants CDS and make report if warranted.   CSW Plan/Description:  No Further Intervention Required/No Barriers to Discharge, Sudden Infant Death Syndrome (SIDS) Education, Perinatal Mood and Anxiety Disorder (PMADs) Education, CSW Will Continue to Monitor Umbilical Cord Tissue Drug Screen Results and Make Report if Warranted, [place], [place] 07/23/2019, 10:35 AM

## 2019-07-23 NOTE — Lactation Note (Signed)
This note was copied from a baby's chart. Lactation Consultation Note Baby 5 hrs old. Mom stated BF going well at this time. Mom denied painful latches. Mom BF her now 29 yr old for 1 yr. Mom stated she BF in cross cradle.  Reviewed newborn feeding habits, STS, I&O, positioning, comfort, supply and demand. Mom encouraged to feed baby 8-12 times/24 hours and with feeding cues.  Mom stated she's cramping during BF.   Encouraged mom to call for latch assist or questions. Lactation brochure given.  Patient Name: Holly Thornton [id number] Date: 07/23/2019 Reason for consult: Initial assessment;Term   Maternal Data Has patient been taught Hand Expression?: Yes Does the patient have breastfeeding experience prior to this delivery?: Yes  Feeding Feeding Type: Breast Fed  LATCH Score Latch: Too sleepy or reluctant, no latch achieved, no sucking elicited.  Audible Swallowing: None  Type of Nipple: Everted at rest and after stimulation(semi-flat)  Comfort (Breast/Nipple): Soft / non-tender  Hold (Positioning): Assistance needed to correctly position infant at breast and maintain latch.  LATCH Score: 5  Interventions Interventions: Breast feeding basics reviewed;Position options  Lactation Tools Discussed/Used WIC Program: No   Consult Status Consult Status: Follow-up Date: 07/24/19 Follow-up type: In-patient    Theodoro Kalata 07/23/2019, 12:39 AM

## 2019-07-23 NOTE — Anesthesia Postprocedure Evaluation (Signed)
Anesthesia Post Note  Patient: Sydell Axon Stotler  Procedure(s) Performed: AN AD Foster     Patient location during evaluation: Mother Baby Anesthesia Type: Epidural Level of consciousness: awake and alert and oriented Pain management: satisfactory to patient Vital Signs Assessment: post-procedure vital signs reviewed and stable Respiratory status: respiratory function stable Cardiovascular status: stable Postop Assessment: no headache, no backache, epidural receding, patient able to bend at knees, no signs of nausea or vomiting, adequate PO intake, no apparent nausea or vomiting and able to ambulate Anesthetic complications: no    Last Vitals:  Vitals:   07/23/19 0230 07/23/19 0616  BP: 112/70 130/87  Pulse: 85 88  Resp: 18 18  Temp: 36.5 C 36.6 C  SpO2: 100% 100%    Last Pain:  Vitals:   07/23/19 0741  TempSrc:   PainSc: 2    Pain Goal:                Epidural/Spinal Function Cutaneous sensation: Normal sensation (07/23/19 0741), Patient able to flex knees: Yes (07/23/19 0741), Patient able to lift hips off bed: Yes (07/23/19 0741), Back pain beyond tenderness at insertion site: No (07/23/19 0741), Progressively worsening motor and/or sensory loss: No (07/23/19 0741), Bowel and/or bladder incontinence post epidural: No (07/23/19 0741)  Katherina Mires

## 2019-07-23 NOTE — Progress Notes (Signed)
Subjective: Postpartum Day # 1 : S/P NSVD due to active labor. Patient up ad lib, denies syncope or dizziness. Reports consuming regular diet without issues and denies N/V. Patient reports + bowel movement and + passing flatus.  Denies issues with urination and reports bleeding is "good."  Patient is breastfeeding and reports going well. Declines postpartum contraception.  Pain is being appropriately managed with use of Motrin.   No laceration Feeding: Breastfeeding Contraceptive plan:  Declines at this time  Objective: Vital signs in last 24 hours: Vitals with BMI 07/23/2019 07/23/2019 07/22/2019  Height - - -  Weight - - -  BMI - - -  Systolic [id number]  Diastolic 87 70 82  Pulse 88 85 101   Physical Exam:  General: alert, cooperative and no distress Mood/Affect: appropriate, excited about the arrival of baby girl, Olivia Lungs: clear to auscultation, no wheezes, rales or rhonchi, symmetric air entry.  Heart: normal rate, regular rhythm, normal S1, S2, no murmurs, rubs, clicks or gallops. Breast: breasts appear normal, no suspicious masses, no skin or nipple changes or axillary nodes. Abdomen:  + bowel sounds, soft, non-tender GU: perineum intact, healing well. No signs of external hematomas.  Uterine Fundus: firm Lochia: appropriate Skin: Warm, Dry. DVT Evaluation: No evidence of DVT seen on physical exam.  CBC Latest Ref Rng & Units 07/23/2019 07/22/2019 01/26/2019  WBC 4.0 - 10.5 K/uL 11.3(H) 8.0 8.2  Hemoglobin 12.0 - 15.0 g/dL 11.9(L) 12.8 11.3(L)  Hematocrit 36.0 - 46.0 % 36.3 39.4 33.8(L)  Platelets 150 - 400 K/uL 169 187 265    Results for orders placed or performed during the hospital encounter of 07/22/19 (from the past 24 hour(s))  CBC     Status: Abnormal   Collection Time: 07/23/19  5:40 AM  Result Value Ref Range   WBC 11.3 (H) 4.0 - 10.5 K/uL   RBC 4.14 3.87 - 5.11 MIL/uL   Hemoglobin 11.9 (L) 12.0 - 15.0 g/dL   HCT 36.3 36.0 - 46.0 %   MCV 87.7 80.0 - 100.0 fL    MCH 28.7 26.0 - 34.0 pg   MCHC 32.8 30.0 - 36.0 g/dL   RDW 14.2 11.5 - 15.5 %   Platelets 169 150 - 400 K/uL   nRBC 0.0 0.0 - 0.2 %     CBG (last 3)  No results for input(s): GLUCAP in the last 72 hours.   I/O last 3 completed shifts: In: 0  Out: 615 [Urine:350; Blood:265]   Assessment Postpartum Day # 1 : S/P NSVD due to active labor. Pt stable. U-1 involution. Breastfeeding. Hemodynamically stable.   Plan: Continue routine postpartum management VTE prophylactics: Early ambulated as tolerates.  Pain control: Motrin/Tylenol PRN Education given regarding options for contraception, including IUD placement, oral contraceptives.  Plan for discharge tomorrow   Dr. Mancel Bale to be updated on patient status  Zettie Pho, MSN 07/23/2019, 11:14 AM

## 2019-07-23 NOTE — Lactation Note (Addendum)
This note was copied from a baby's chart. Lactation Consultation Note  Patient Name: Girl Maudelle Capote [id number] Date: 07/23/2019  Mom reports that infant has not been latching well today. Infant with pacifier on arrival.  Assisted in unwrapping infant and trying to latch her.  Mom has coconut oil all over breast.  Infant comes off and on the breast many times crying and will not latch well.  Urged parents to wipe off coconut oil.  Attempted again.  Fed infant a few drops via spoon.  Infant still fussy.  Mom got upset.  Left infant STS.  Urged to feed more often.  Based on early hunger cues.Call lactation as needed.     Maternal Data    Feeding Feeding Type: Breast Milk  LATCH Score                   Interventions    Lactation Tools Discussed/Used     Consult Status      Teagan Heidrick Thompson Caul 07/23/2019, 4:21 PM

## 2019-07-24 MED ORDER — IBUPROFEN 600 MG PO TABS: 600.0000 mg | ORAL_TABLET | Freq: Four times a day (QID) | ORAL | 0 refills | Status: AC

## 2019-07-24 NOTE — Lactation Note (Signed)
This note was copied from a baby's chart. Lactation Consultation Note  Patient Name: Holly Thornton [id number] Date: 07/24/2019    [facility] Follow Up:  Attempted to visit with mother, however, she was asleep.  Spoke with father and informed him I will return later to visit.                [name] R Destyn Parfitt 07/24/2019, 7:40 AM

## 2019-07-24 NOTE — Discharge Summary (Signed)
OB Discharge Summary    Patient Name: Holly Thornton DOB: 1990/12/15 MRN: [id number]  Date of admission: 07/22/2019 Delivering MD: Burman Foster B  Date of delivery: 07/22/19 Type of delivery: NSVD  Newborn Data: Sex: Baby girl Name: Minette Brine Live born female  Birth Weight: 7 lb 7.8 oz (3396 g) APGAR: 75, 9  Newborn Delivery   Birth date/time: 07/22/2019 19:27:00 Delivery type: Vaginal, Spontaneous    Feeding: breast Infant being discharge to home with mother in stable condition.   Admitting diagnosis: Normal labor [O80, Z37.9] Intrauterine pregnancy: [redacted]w[redacted]d     Secondary diagnosis:  Active Problems:   Normal labor                               Complications: None                                                              Intrapartum Procedures: spontaneous vaginal delivery Postpartum Procedures: none Complications-Operative and Postpartum: none  History of Present Illness: Ms. Shanaye Schuldt is a 29 y.o. female, [id number], who presents at [redacted]w[redacted]d weeks gestation. The patient has been followed at  [facility]  Her pregnancy has been complicated by:  Patient Active Problem List   Diagnosis Date Noted  . Normal labor 07/22/2019  . Hyperemesis gravidarum 12/17/2018    Class: Diagnosis of  . Mild tetrahydrocannabinol (THC) abuse 12/13/2018  . History of colitis 02/10/2015  . History of frequent urinary tract infections 02/10/2015  . Allergy to drug - Amoxicillin - anaphylaxis and swelling 02/10/2015  . 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 ADHD 03/01/2009   Hospital course:  Onset of Labor With Vaginal Delivery     29 y.o. yo [id number] at [redacted]w[redacted]d was admitted in Active Labor on 07/22/2019. Patient had an uncomplicated labor course as follows:  Membrane Rupture Time/Date: 4:11 PM ,07/22/2019   Intrapartum Procedures: Episiotomy: None [1]                        Lacerations:  None [1]  Patient had a delivery of a Viable infant. 07/22/2019  Information for the patient's newborn:  Soniah, Inzunza [id number]     Patient had an uncomplicated postpartum course.  She is ambulating, tolerating a regular diet, passing flatus, and urinating well. Patient is discharged home in stable condition on 07/24/19.  Postpartum Day # 2 : S/P NSVD due to active labor. Patient up ad lib, denies syncope or dizziness. Reports consuming regular diet without issues and denies N/V. Patient reports having bowel movement and is passing flatus.  Denies issues with urination and reports bleeding is "fine."  Patient is breastfeeding and reports going well.  Denies postpartum contraception at this time.  Pain is being appropriately managed with use of Motrin.   Physical exam  Vitals:   07/23/19 0616 07/23/19 1435 07/23/19 2045 07/24/19 0532  BP: 130/87 125/84 112/71 118/74  Pulse: 88 97 97 (!) 105  Resp: 18 18 18 18   Temp: 97.9 F (36.6 C) 98.4 F (36.9 C) 98.2 F (36.8 C) 97.9 F (36.6 C)  TempSrc: Oral Oral Oral Oral  SpO2: 100%     Weight:      Height:       General: alert, cooperative and no distress Lochia: appropriate Uterine Fundus: firm Perineum: intact DVT Evaluation: No evidence of DVT seen on physical exam.  Labs: Lab Results  Component Value Date   WBC 11.3 (H) 07/23/2019   HGB 11.9 (L) 07/23/2019   HCT 36.3 07/23/2019   MCV 87.7 07/23/2019   PLT 169 07/23/2019   CMP Latest Ref Rng & Units 07/22/2019  Glucose 70 - 99 mg/dL 104(H)  BUN 6 - 20 mg/dL 8  Creatinine 0.44 - 1.00 mg/dL 0.62  Sodium 135 - 145 mmol/L 134(L)  Potassium 3.5 - 5.1 mmol/L 3.8  Chloride 98 - 111 mmol/L 103  CO2 22 - 32 mmol/L 20(L)  Calcium 8.9 - 10.3 mg/dL 9.2  Total Protein 6.5 - 8.1 g/dL 7.0  Total Bilirubin 0.3 - 1.2 mg/dL 0.3  Alkaline Phos 38 - 126 U/L 178(H)  AST 15 - 41 U/L 22  ALT 0 - 44 U/L 18   Date of discharge: 07/24/2019 Discharge  Diagnoses: Term Pregnancy-delivered Discharge instruction: per After Visit Summary and "Baby and Me Booklet".  Activity:           unrestricted Advance as tolerated. Pelvic rest for 6 weeks.  Diet:                routine Medications: Ibuprofen Postpartum contraception: Undecided Condition:  Pt discharge to home with baby in stable condition  Meds: Allergies as of 07/24/2019      Reactions   Amoxicillin Anaphylaxis, Swelling   Has patient had a PCN reaction causing immediate rash, facial/tongue/throat swelling, SOB or lightheadedness with hypotension: Yes Has patient had a PCN reaction causing severe rash involving mucus membranes or skin necrosis: No Has patient had a PCN reaction that required hospitalization No Has patient had a PCN reaction occurring within the last 10 years: No If all of the above answers are "NO", then may proceed with Cephalosporin use. Pt has tolerated Ampicillin 07-22-19 Pt states she has receive   Peanut-containing Drug Products Anaphylaxis, Hives, Diarrhea, Rash, Other (See Comments)   Puffiness of the eyes       Medication List    STOP taking these medications   acetaminophen 325 MG tablet Commonly known as: TYLENOL     TAKE these medications   ibuprofen 600 MG tablet Commonly known as: ADVIL Take 1 tablet (600 mg total) by mouth every 6 (six) hours.   multivitamin-prenatal 27-0.8 MG Tabs tablet Take 1 tablet by mouth daily at 12 noon.      Discharge Follow Up:  Follow-up [facility]. Schedule an appointment as soon as possible for a visit in 6 week(s).   Specialty: Obstetrics and Gynecology Contact information: [address] [phone]          Aziza Stuckert K. Suszanne Conners, MSN 07/24/2019, 12:19 PM

## 2019-07-24 NOTE — Lactation Note (Signed)
This note was copied from a baby's chart. Lactation Consultation Note  Patient Name: Holly Thornton [id number] Date: 07/24/2019 Reason for consult: Follow-up assessment;Term  P2 mother whose infant is now 63 hours old.  This is a term baby at 40+0 weeks.  Mother breast fed her first child (now 29 years old) for one year.  Mother had baby latched when I arrived.  Baby had a wide gape, flanged lips and looked comfortable feeding.  Mother had no questions/concerns related to breast feeding and stated that baby has been latching well.  She denies pain with latching and feels uterine contractions with feedings.    Encouraged to continue feeding 8-12 times/24 hours or sooner if baby shows feeding cues.  Mother will feed back any EBM she obtains to baby.  Mother was able to demonstrate breast compressions and gentle stimulation.  Reminded to continue feeding STS until baby is feeding well.  Engorgement prevention/treatment reviewed.  Mother has a manual pump and a DEBP for home use.  She has our OP phone number for questions/concerns after discharge.  Father present.     Maternal Data Formula Feeding for Exclusion: No Has patient been taught Hand Expression?: Yes Does the patient have breastfeeding experience prior to this delivery?: Yes  Feeding Feeding Type: Breast Fed  LATCH Score Latch: Grasps breast easily, tongue down, lips flanged, rhythmical sucking.  Audible Swallowing: Spontaneous and intermittent  Type of Nipple: Everted at rest and after stimulation  Comfort (Breast/Nipple): Soft / non-tender  Hold (Positioning): Assistance needed to correctly position infant at breast and maintain latch.  LATCH Score: 9  Interventions Interventions: Breast feeding basics reviewed;Skin to skin;Breast compression;Support pillows  Lactation Tools Discussed/Used     Consult Status Consult Status: Complete Date: 07/24/19 Follow-up type: Call as needed    Lanice Schwab Hady Niemczyk 07/24/2019,  9:48 AM

## 2019-07-24 NOTE — Lactation Note (Signed)
This note was copied from a baby's chart. Lactation Consultation Note Baby 29 hrs old. Baby fussy. Mom called for latching assistance. Mom stated at 1100 pm baby BF great w/o difficulty. In football position assisted in latching.  Mom has flat nipples but very compressible. Able to get good deep latch but baby wouldn't suck, just cry. Asked mom if she had been giving pacifiers, mom stated she took it away after she was told she shouldn't give it. Baby passing smelly gas. Mentioned to mom baby may have gas. Mom put baby on her shoulders to burp. Baby passing gas. Encouraged to call if needs assistance for next feeding.  Patient Name: Holly Thornton [id number] Date: 07/24/2019 Reason for consult: Mother's request;Term   Maternal Data    Feeding Feeding Type: Breast Fed  LATCH Score Latch: Too sleepy or reluctant, no latch achieved, no sucking elicited.  Audible Swallowing: None  Type of Nipple: Flat  Comfort (Breast/Nipple): Soft / non-tender  Hold (Positioning): Assistance needed to correctly position infant at breast and maintain latch.  LATCH Score: 4  Interventions Interventions: Breast feeding basics reviewed;Support pillows;Assisted with latch;Position options;Skin to skin;Breast massage;Breast compression;Adjust position;Hand express  Lactation Tools Discussed/Used     Consult Status Consult Status: Follow-up Date: 07/24/19 Follow-up type: In-patient    Kamarion Zagami, Elta Gu[place] 07/24/2019, 1:42 AM

## 2019-07-26 LAB — SURGICAL PATHOLOGY

## 2019-07-27 ENCOUNTER — Other Ambulatory Visit (HOSPITAL_COMMUNITY): Payer: Medicaid Other | Attending: Obstetrics & Gynecology

## 2019-07-29 ENCOUNTER — Inpatient Hospital Stay (HOSPITAL_COMMUNITY)
Admission: AD | Admit: 2019-07-29 | Payer: Managed Care, Other (non HMO) | Source: Home / Self Care | Admitting: Obstetrics & Gynecology

## 2019-07-29 ENCOUNTER — Inpatient Hospital Stay (HOSPITAL_COMMUNITY): Payer: Managed Care, Other (non HMO)

## 2020-12-09 IMAGING — US OBSTETRIC <14 WK US AND TRANSVAGINAL OB US
1 series · 15 of 28 positions shown · non-contrast
Comparison: No prior scans from this gestation

CLINICAL DATA: 27-year-old pregnant female presents with abdominal
pain. Quantitative beta HCG pending.

EDC by LMP: 03/27/2019, projecting to an expected gestational age of
4 weeks 6 days.
EXAM:
OBSTETRIC <14 WK US AND TRANSVAGINAL OB US
TECHNIQUE: Both transabdominal and transvaginal ultrasound examinations were
performed for complete evaluation of the gestation as well as the
maternal uterus, adnexal regions, and pelvic cul-de-sac.
Transvaginal technique was performed to assess early pregnancy.

[Series 1: obstetric <14 wk us and transvaginal ob us · 88 acquisitions, 15 frames shown]
[im 1/88]
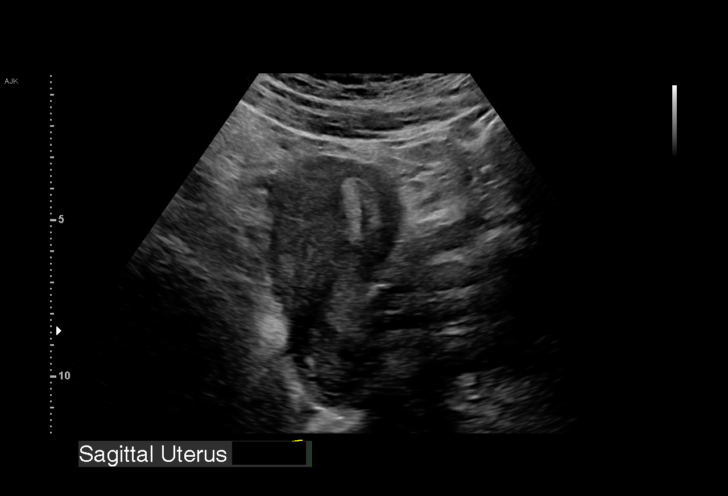
[im 7/88]
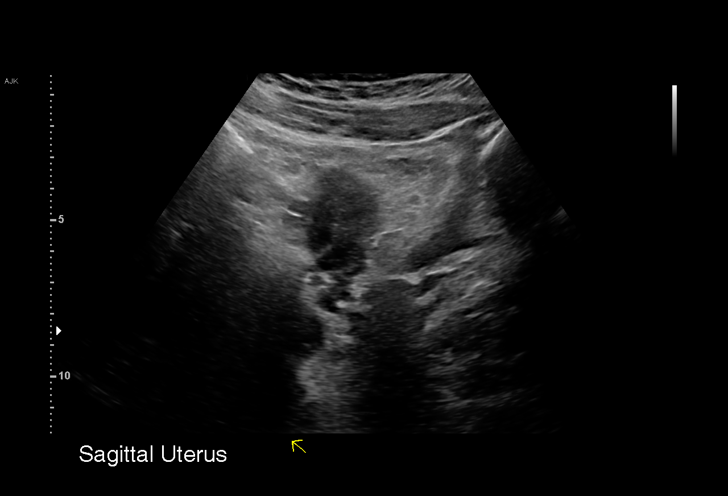
[im 13/88]
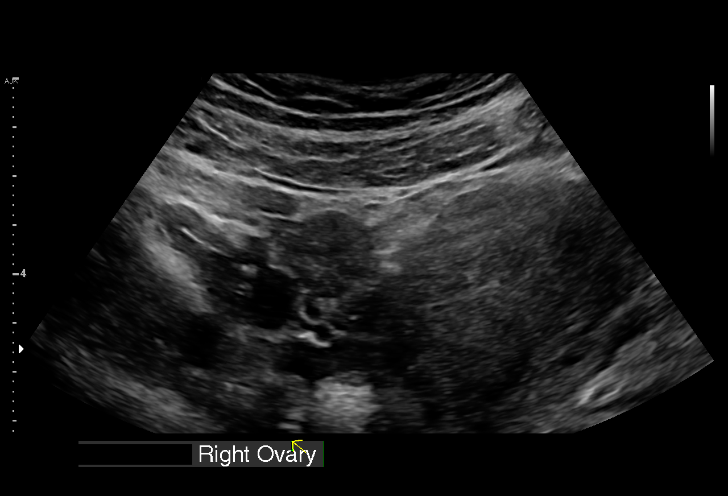
[im 20/88]
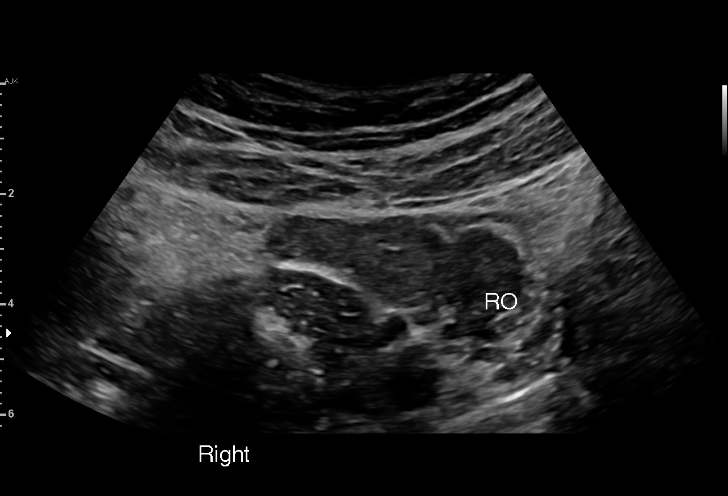
[im 26/88]
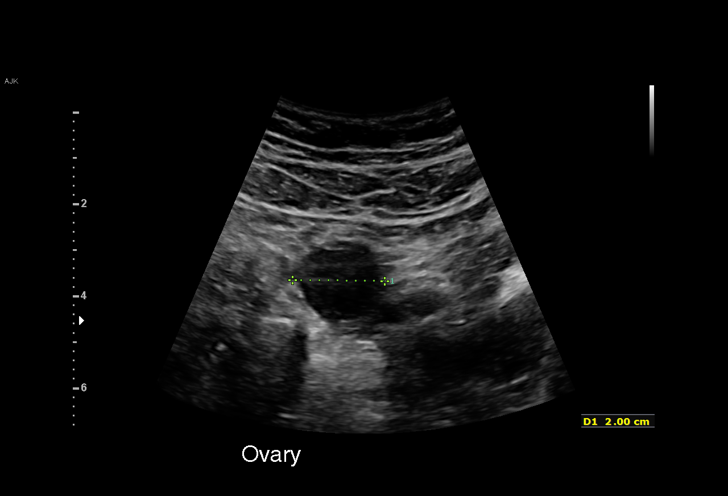
[im 33/88]
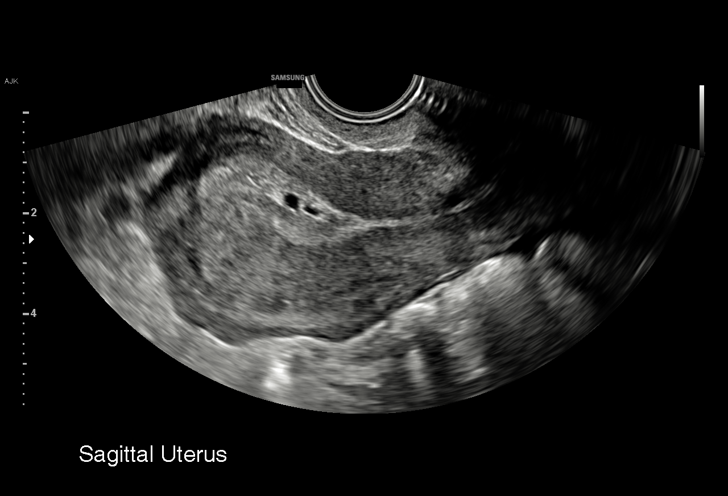
[im 39/88]
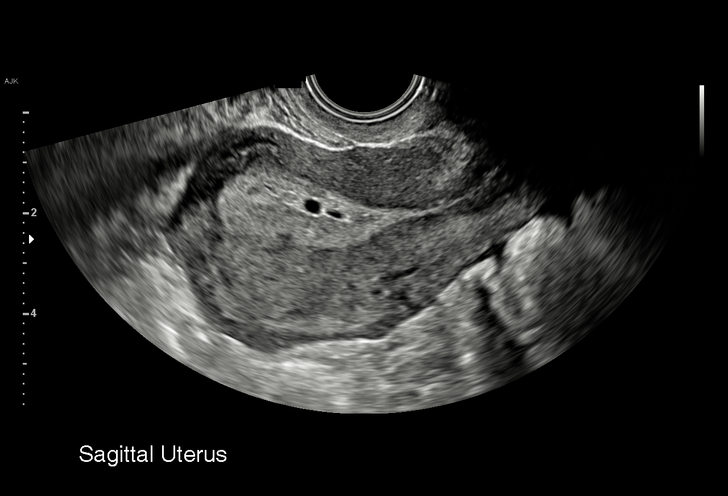
[im 46/88]
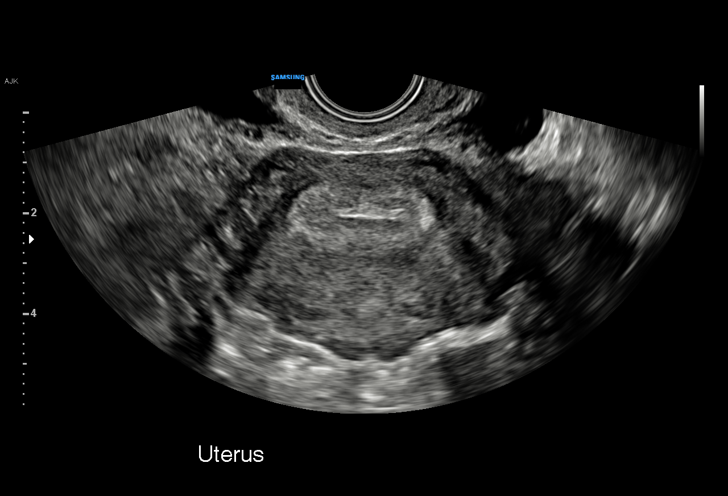
[im 49/88]
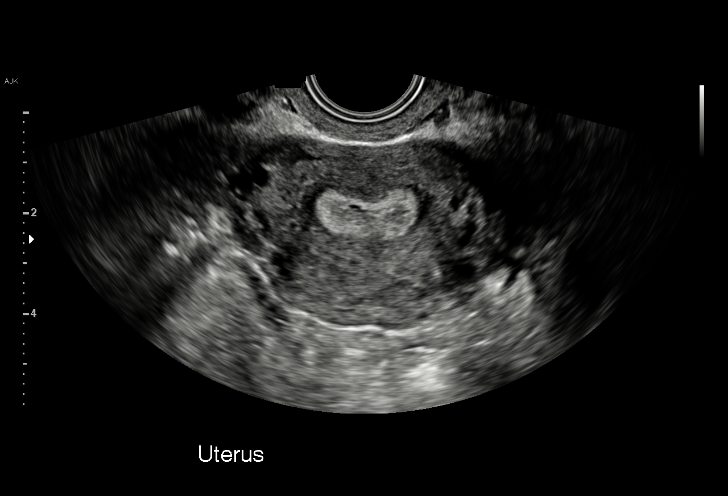
[im 55/88]
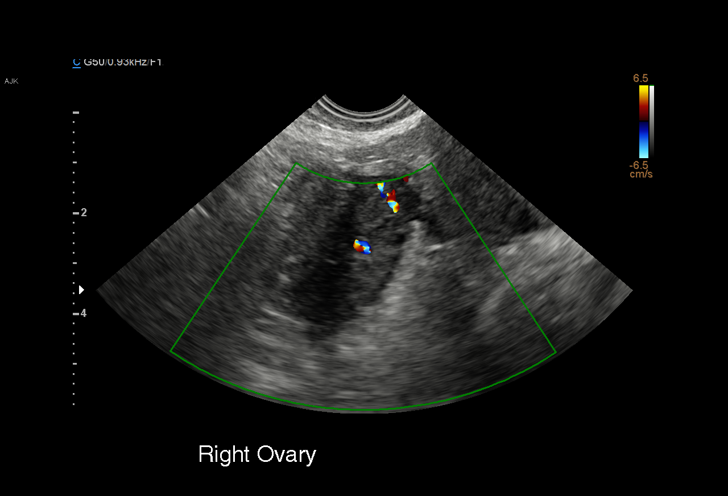
[im 62/88]
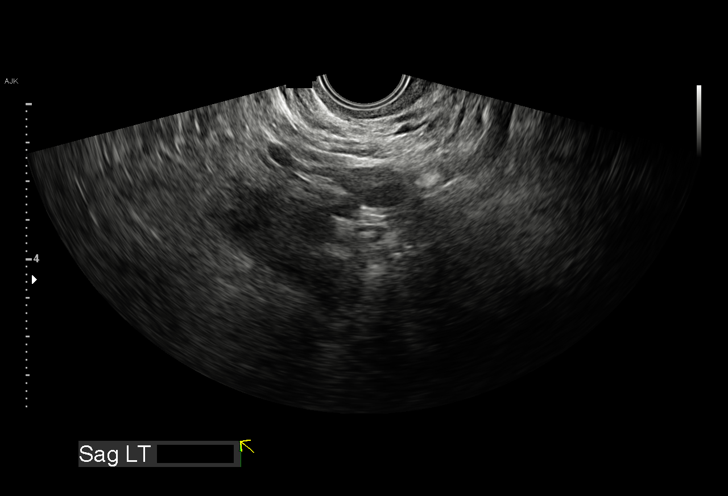
[im 68/88]
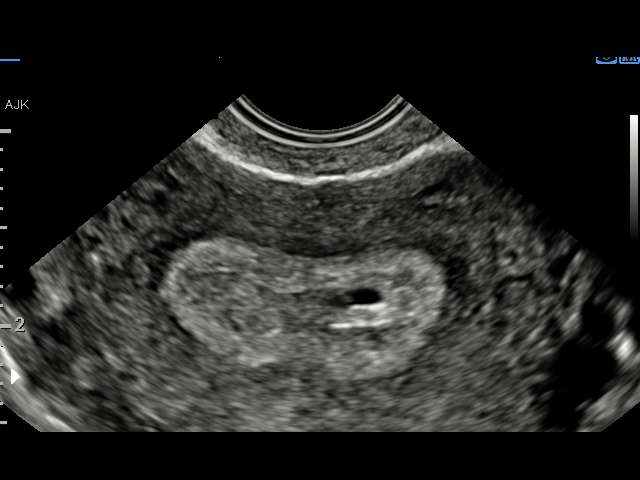
[im 75/88]
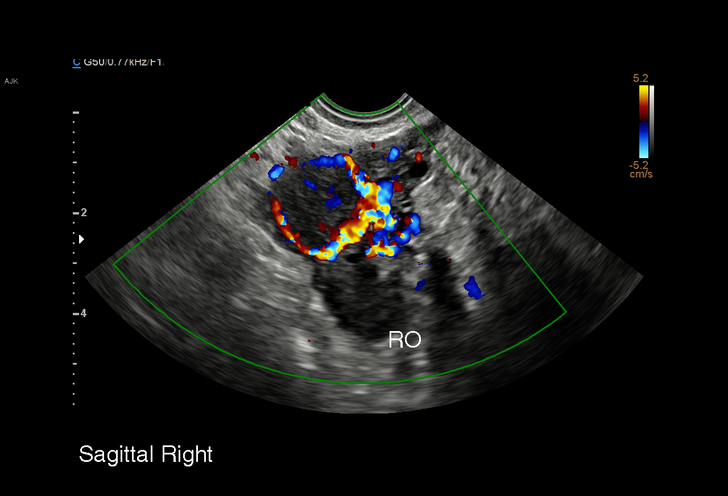
[im 81/88]
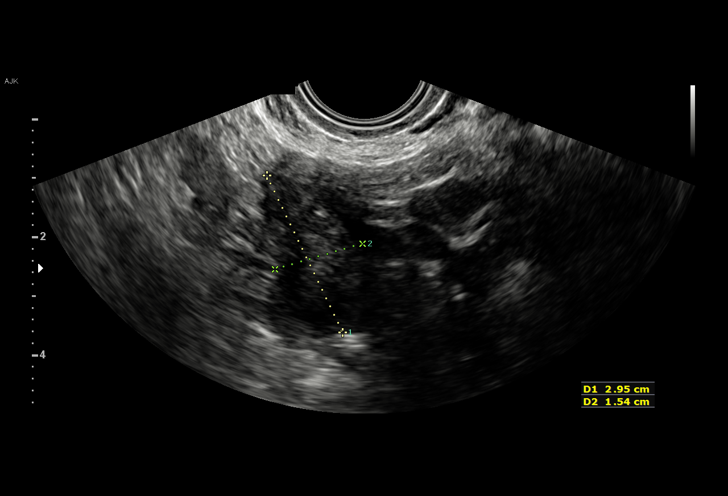
[im 88/88]
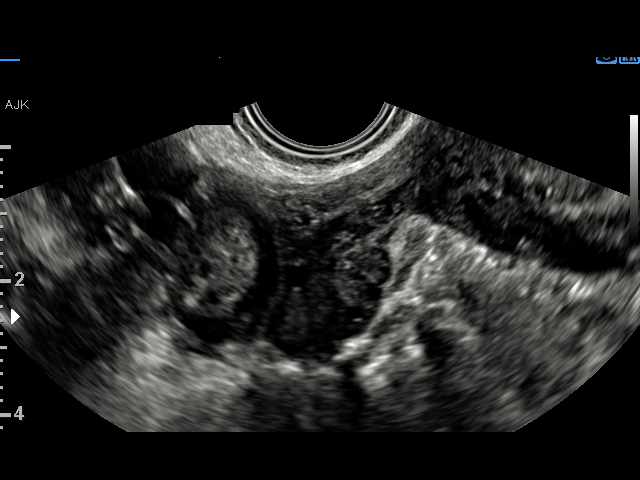

[15 of 28 positions shown; findings below may reference images not displayed]

FINDINGS: Anteverted uterus. No uterine fibroids or other myometrial
abnormalities. There is a nonspecific tiny 4 x 3 x 5 mm thin-walled
cystic structure within the uterine cavity, without associated yolk
sac, embryo or embryonic cardiac activity. No additional focal
endometrial abnormality.

Right ovary measures 4.2 x 2.1 x 1.7 cm and contains a corpus
luteum. There is a separate right adnexal mass with mixed
echogenicity measuring 2.5 x 1.7 x 1.6 cm with central cystic focus,
which is separate from the right ovary, and is suspicious for a
right tubal ectopic pregnancy. No yolk sac, embryo or embryonic
activity are demonstrated within this right adnexal mass.

Left ovary measures 2.2 x 1.9 x 2.2 cm. No abnormal left ovarian or
left adnexal masses. No abnormal free fluid in the pelvis.
IMPRESSION: 1. Right adnexal 2.5 x 1.7 x 1.6 cm mass with mixed echogenicity
with central cystic focus, separate from the right ovary, suspicious
for a right tubal ectopic pregnancy. No yolk sac, embryo or
embryonic activity within this right adnexal mass. No abnormal free
fluid in the pelvis.
2. No definite intrauterine gestation. Nonspecific tiny 5 mm
thin-walled cystic structure within the uterine cavity without yolk
sac, embryo or embryonic cardiac activity, favored to represent a
pseudo-gestational sac.

Critical Value/emergent results were called by telephone at the time
of interpretation on 07/24/2018 at [DATE] to Dr. DREY JIM, who
verbally acknowledged these results.

## 2020-12-14 IMAGING — US TRANSVAGINAL OB ULTRASOUND
1 series · 15 of 28 positions shown · non-contrast
Comparison: Obstetric ultrasound 5 days prior 07/24/2018

CLINICAL DATA: Known right ectopic pregnancy, 5 days post
methotrexate. New pelvic pain.

EXAM:
TRANSVAGINAL OB ULTRASOUND
TECHNIQUE: Transvaginal ultrasound was performed for complete evaluation of the
gestation as well as the maternal uterus, adnexal regions, and
pelvic cul-de-sac.

[Series 1: transvaginal ob ultrasound · 50 acquisitions, 15 frames shown]
[im 1/50]
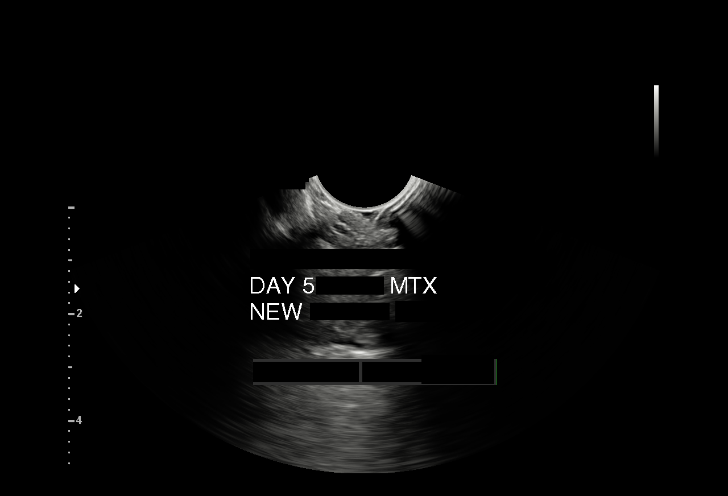
[im 4/50]
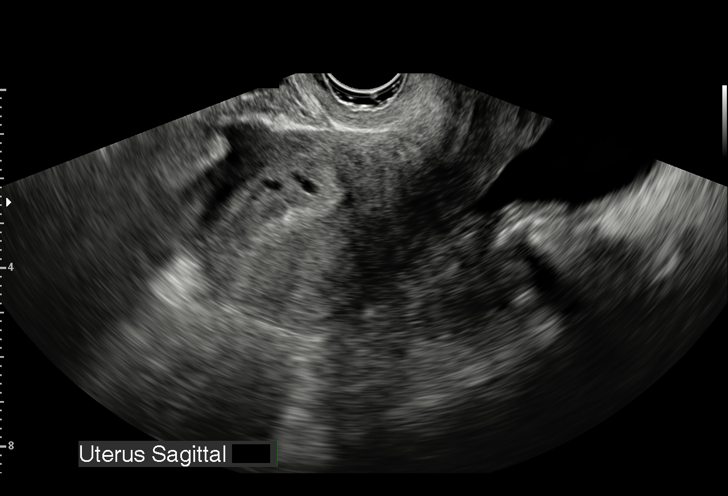
[im 8/50]
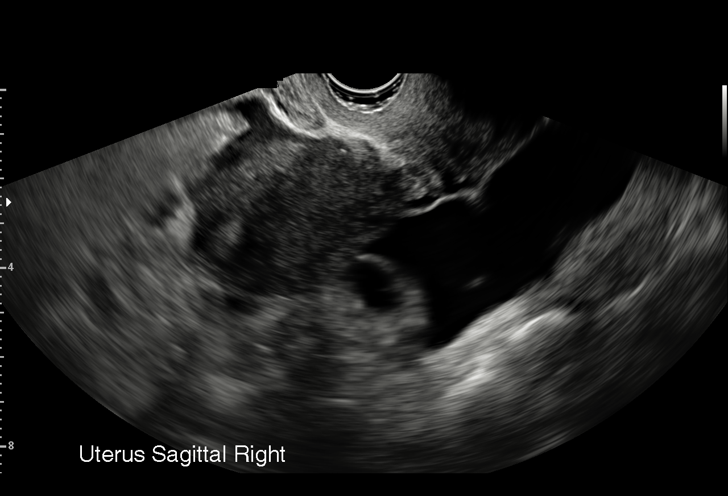
[im 11/50]
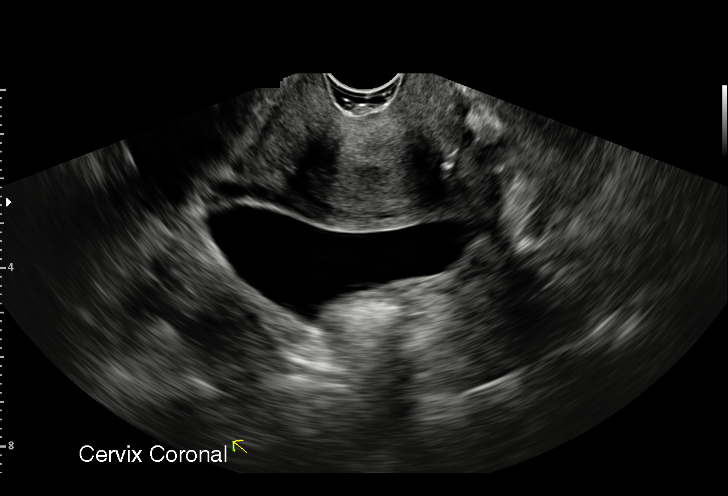
[im 15/50]
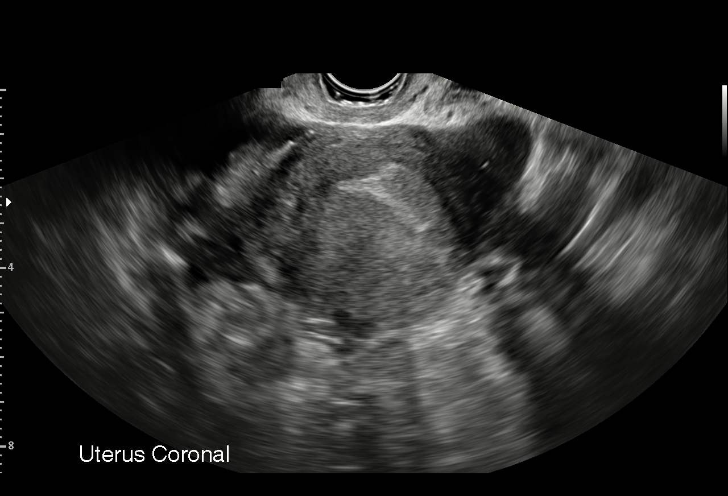
[im 19/50]
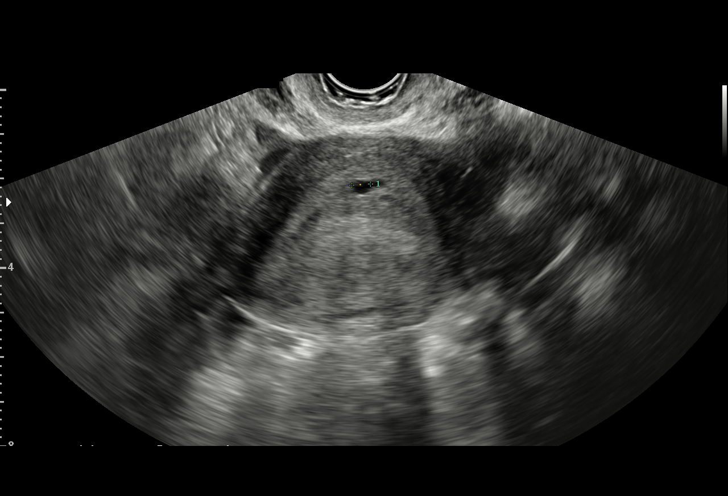
[im 22/50]
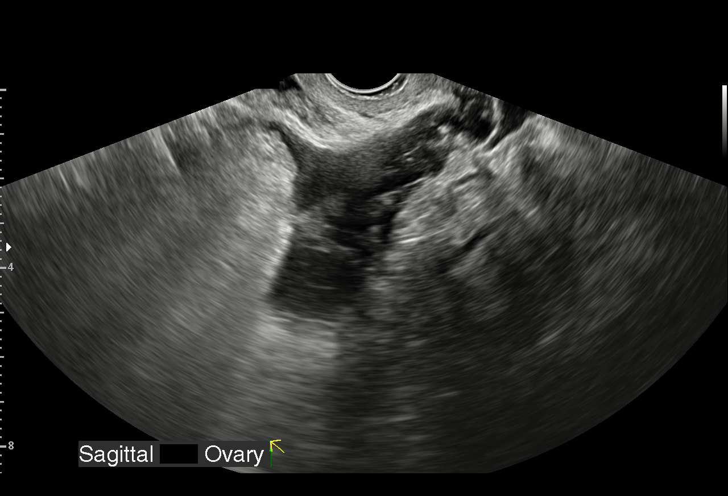
[im 26/50]
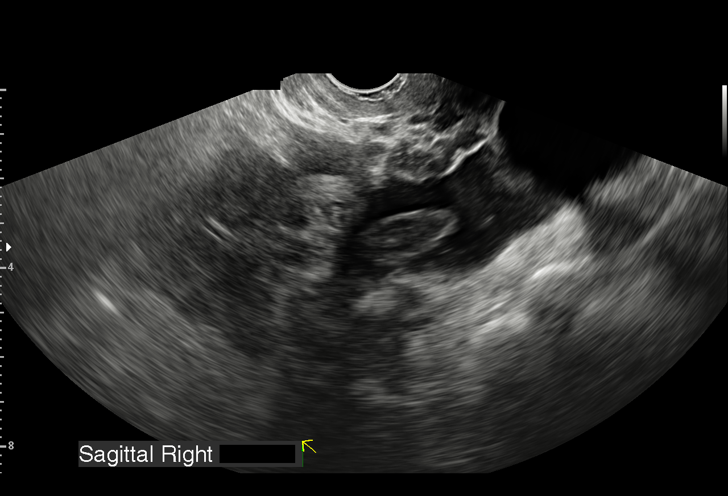
[im 28/50]
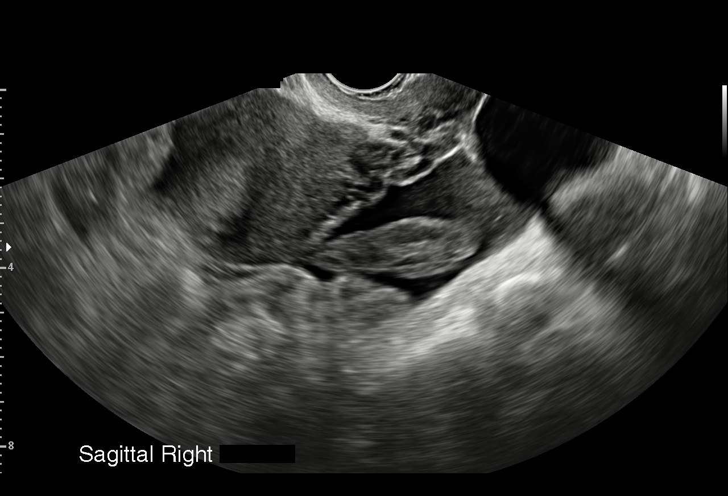
[im 31/50]
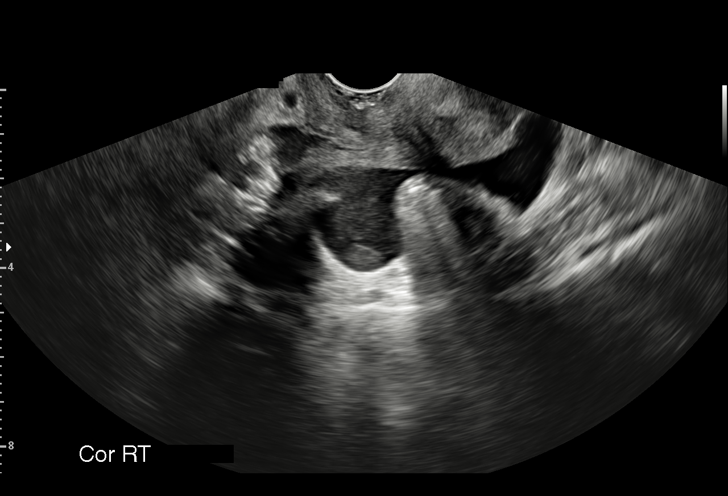
[im 35/50]
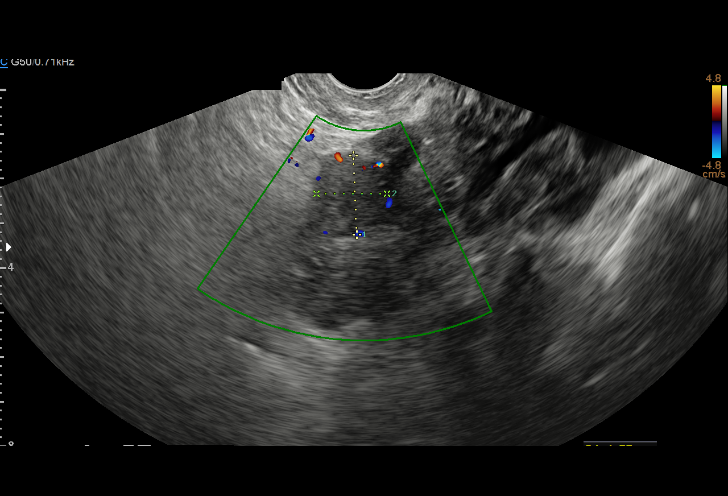
[im 39/50]
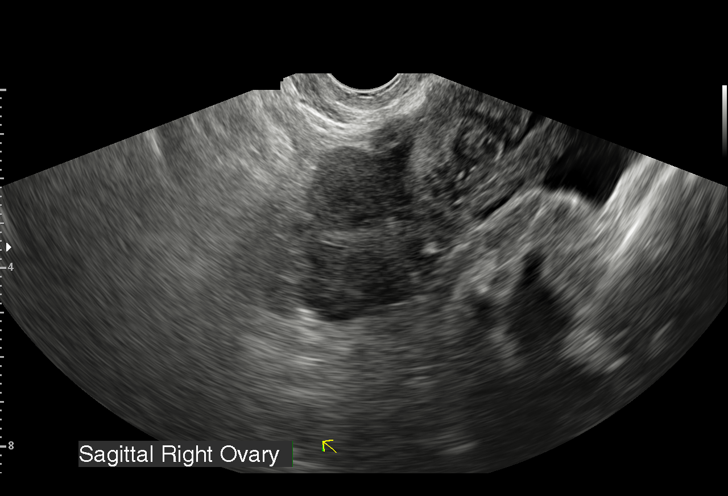
[im 42/50]
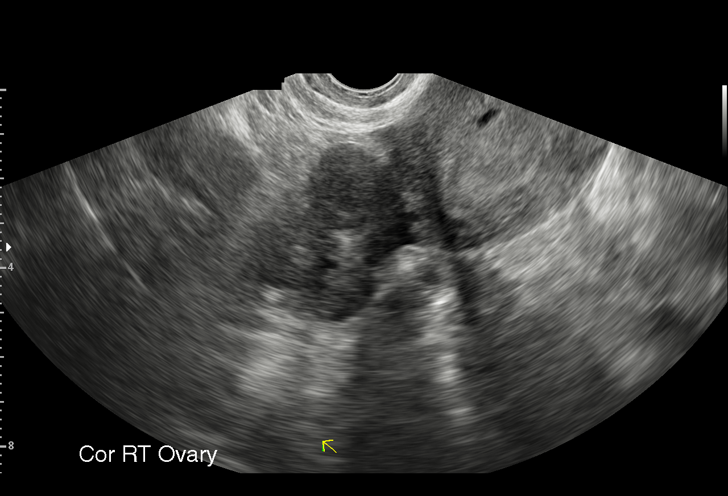
[im 46/50]
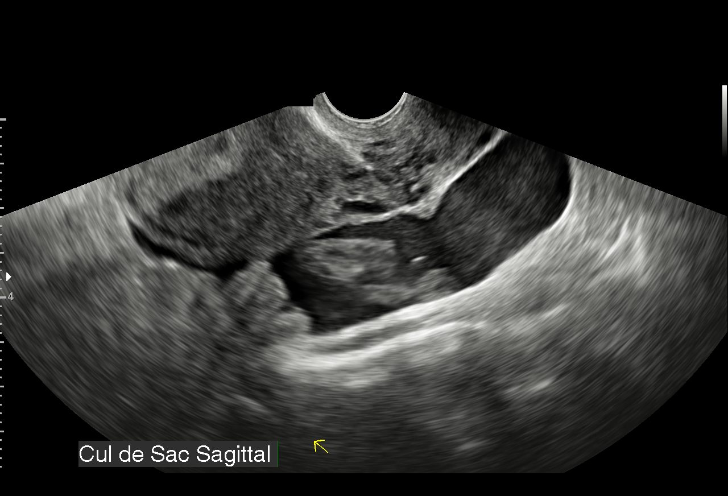
[im 50/50]
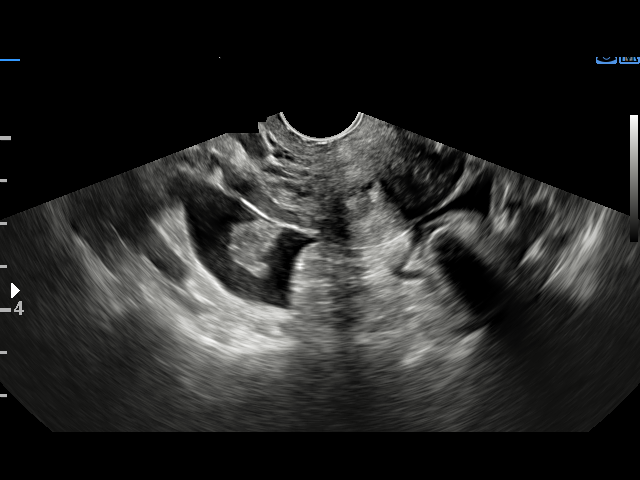

[15 of 28 positions shown; findings below may reference images not displayed]

FINDINGS: Intrauterine gestational sac: None.

Yolk sac:  Not Visualized.

Embryo:  Not Visualized.

Maternal uterus/adnexae: Tiny cystic structures in the endometrium
all measure less than 4 mm,, slightly diminished in size and
increased in number from prior exam.

Heterogeneous masslike focus adjacent to the right ovary measures
1.8 x 1.6 x 1.9 cm consistent with known ectopic pregnancy. This
previously measured 2.5 x 1.7 x 1.6 cm. The central cystic focus on
prior exam persists but is less well appreciated. The adjacent right
ovary measures 2.7 x 1.9 x 1.7 cm. The left ovary appears normal.
Moderate volume of complex free fluid in the pelvis is new from
prior.
IMPRESSION: Known right ectopic pregnancy persists and has slightly diminished
in size over the past 5 days with persistent 1.9 cm masslike area.
There is a new moderate volume of complex free fluid in the pelvis,
which is concerning for interval rupture.

Critical Value/emergent results were called by telephone at the time
who verbally acknowledged these results.
